# Patient Record
Sex: Female | Born: 1965
Health system: Southern US, Community
[De-identification: ages and names within clinical notes are randomized; demographics above are authoritative.]

## PROBLEM LIST (undated history)

## (undated) DIAGNOSIS — I499 Cardiac arrhythmia, unspecified: Secondary | ICD-10-CM

## (undated) DIAGNOSIS — R0602 Shortness of breath: Secondary | ICD-10-CM

## (undated) DIAGNOSIS — F32A Depression, unspecified: Secondary | ICD-10-CM

## (undated) DIAGNOSIS — T7840XA Allergy, unspecified, initial encounter: Secondary | ICD-10-CM

## (undated) DIAGNOSIS — J45909 Unspecified asthma, uncomplicated: Secondary | ICD-10-CM

## (undated) DIAGNOSIS — M7989 Other specified soft tissue disorders: Secondary | ICD-10-CM

## (undated) DIAGNOSIS — N390 Urinary tract infection, site not specified: Secondary | ICD-10-CM

## (undated) DIAGNOSIS — K219 Gastro-esophageal reflux disease without esophagitis: Secondary | ICD-10-CM

## (undated) DIAGNOSIS — R002 Palpitations: Secondary | ICD-10-CM

## (undated) DIAGNOSIS — Z91018 Allergy to other foods: Secondary | ICD-10-CM

## (undated) DIAGNOSIS — B029 Zoster without complications: Secondary | ICD-10-CM

## (undated) DIAGNOSIS — K2100 Gastro-esophageal reflux disease with esophagitis, without bleeding: Secondary | ICD-10-CM

## (undated) DIAGNOSIS — B019 Varicella without complication: Secondary | ICD-10-CM

## (undated) DIAGNOSIS — F329 Major depressive disorder, single episode, unspecified: Secondary | ICD-10-CM

## (undated) DIAGNOSIS — K589 Irritable bowel syndrome without diarrhea: Secondary | ICD-10-CM

## (undated) DIAGNOSIS — F419 Anxiety disorder, unspecified: Secondary | ICD-10-CM

## (undated) DIAGNOSIS — I10 Essential (primary) hypertension: Secondary | ICD-10-CM

## (undated) HISTORY — DX: Zoster without complications: B02.9

## (undated) HISTORY — DX: Other specified soft tissue disorders: M79.89

## (undated) HISTORY — DX: Gastro-esophageal reflux disease with esophagitis, without bleeding: K21.00

## (undated) HISTORY — DX: Allergy, unspecified, initial encounter: T78.40XA

## (undated) HISTORY — DX: Palpitations: R00.2

## (undated) HISTORY — DX: Anxiety disorder, unspecified: F41.9

## (undated) HISTORY — DX: Depression, unspecified: F32.A

## (undated) HISTORY — DX: Urinary tract infection, site not specified: N39.0

## (undated) HISTORY — DX: Major depressive disorder, single episode, unspecified: F32.9

## (undated) HISTORY — DX: Allergy to other foods: Z91.018

## (undated) HISTORY — DX: Gastro-esophageal reflux disease with esophagitis: K21.0

## (undated) HISTORY — DX: Unspecified asthma, uncomplicated: J45.909

## (undated) HISTORY — DX: Varicella without complication: B01.9

## (undated) HISTORY — DX: Irritable bowel syndrome, unspecified: K58.9

## (undated) HISTORY — DX: Gastro-esophageal reflux disease without esophagitis: K21.9

## (undated) HISTORY — DX: Shortness of breath: R06.02

## (undated) HISTORY — DX: Essential (primary) hypertension: I10

## (undated) HISTORY — DX: Cardiac arrhythmia, unspecified: I49.9

---
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
## 1993-10-17 HISTORY — PX: DILATION AND CURETTAGE OF UTERUS: SHX78

## 2004-09-16 ENCOUNTER — Ambulatory Visit: Payer: Self-pay | Admitting: Obstetrics and Gynecology

## 2004-10-17 HISTORY — PX: NASAL SINUS SURGERY: SHX719

## 2005-09-30 ENCOUNTER — Ambulatory Visit: Payer: Self-pay | Admitting: Obstetrics and Gynecology

## 2006-10-26 ENCOUNTER — Ambulatory Visit: Payer: Self-pay | Admitting: Obstetrics and Gynecology

## 2007-10-30 ENCOUNTER — Ambulatory Visit: Payer: Self-pay | Admitting: Obstetrics and Gynecology

## 2008-11-04 ENCOUNTER — Ambulatory Visit: Payer: Self-pay | Admitting: Obstetrics and Gynecology

## 2009-11-10 ENCOUNTER — Ambulatory Visit: Payer: Self-pay | Admitting: Obstetrics and Gynecology

## 2010-10-15 ENCOUNTER — Emergency Department: Payer: Self-pay | Admitting: Emergency Medicine

## 2010-10-17 HISTORY — PX: CARDIAC ELECTROPHYSIOLOGY STUDY AND ABLATION: SHX1294

## 2010-11-04 ENCOUNTER — Emergency Department: Payer: Self-pay | Admitting: Emergency Medicine

## 2010-11-11 ENCOUNTER — Ambulatory Visit: Payer: Self-pay | Admitting: Obstetrics and Gynecology

## 2010-11-17 ENCOUNTER — Ambulatory Visit: Payer: Self-pay | Admitting: Internal Medicine

## 2011-08-18 LAB — HM PAP SMEAR

## 2011-11-15 ENCOUNTER — Ambulatory Visit: Payer: Self-pay | Admitting: Obstetrics and Gynecology

## 2012-08-02 ENCOUNTER — Encounter: Payer: Self-pay | Admitting: Internal Medicine

## 2012-08-02 ENCOUNTER — Ambulatory Visit (INDEPENDENT_AMBULATORY_CARE_PROVIDER_SITE_OTHER): Payer: Self-pay | Admitting: Internal Medicine

## 2012-08-02 ENCOUNTER — Other Ambulatory Visit: Payer: Self-pay | Admitting: Internal Medicine

## 2012-08-02 VITALS — BP 120/90 | HR 73 | Temp 98.1°F | Ht 66.0 in | Wt 170.0 lb

## 2012-08-02 DIAGNOSIS — Z9889 Other specified postprocedural states: Secondary | ICD-10-CM

## 2012-08-02 DIAGNOSIS — Z23 Encounter for immunization: Secondary | ICD-10-CM

## 2012-08-02 DIAGNOSIS — J45990 Exercise induced bronchospasm: Secondary | ICD-10-CM

## 2012-08-02 DIAGNOSIS — Z0000 Encounter for general adult medical examination without abnormal findings: Secondary | ICD-10-CM

## 2012-08-02 DIAGNOSIS — Z1239 Encounter for other screening for malignant neoplasm of breast: Secondary | ICD-10-CM

## 2012-08-02 MED ORDER — ALBUTEROL SULFATE HFA 108 (90 BASE) MCG/ACT IN AERS: 2.0000 | INHALATION_SPRAY | Freq: Four times a day (QID) | RESPIRATORY_TRACT | Status: DC | PRN

## 2012-08-02 NOTE — Assessment & Plan Note (Signed)
General exam today including breast exam is normal. Pap is deferred because this is up-to-date September 2012. Will schedule mammogram for January 2014. Flu vaccine given today. Will check basic labs including CBC, CMP, lipid profile, TSH, vitamin D. Patient will followup in one year or sooner as needed.

## 2012-08-02 NOTE — Assessment & Plan Note (Signed)
Symptoms of exercise-induced asthma described as minimal. Patient only rarely has used albuterol inhaler. Will give refill on this today.

## 2012-08-02 NOTE — Progress Notes (Signed)
Subjective:    Patient ID: Jill Atkinson, female    DOB: 1965/10/29, 46 y.o.   MRN: [id number]  HPI 46 year old female with history of exercise-induced asthma presents to establish care. She reports she is generally doing well. She follows a relatively healthy diet and exercises by either biking or walking on a regular basis. She occasionally has some shortness of breath with exercise. In the past, she has used albuterol inhaler with some improvement. She denies any chronic cough, shortness of breath at rest. She denies any new concerns today. She reports that she is up-to-date on health maintenance including Pap smear which was performed September 2012 was normal including HPV testing which was negative. Her mammogram was completed in January 2013 was normal.  Outpatient Encounter Prescriptions as of 08/02/2012  Medication Sig Dispense Refill  . albuterol (VENTOLIN HFA) 108 (90 BASE) MCG/ACT inhaler Inhale 2 puffs into the lungs every 6 (six) hours as needed for wheezing.  18 g  3   BP 120/90  Pulse 73  Temp 98.1 F (36.7 C) (Oral)  Ht 5\' 6"  (1.676 m)  Wt 170 lb (77.111 kg)  BMI 27.44 kg/m2  SpO2 98%  LMP 07/24/2012  Review of Systems  Constitutional: Negative for fever, chills, appetite change, fatigue and unexpected weight change.  HENT: Negative for ear pain, congestion, sore throat, trouble swallowing, neck pain, voice change and sinus pressure.   Eyes: Negative for visual disturbance.  Respiratory: Negative for cough, shortness of breath, wheezing and stridor.   Cardiovascular: Negative for chest pain, palpitations and leg swelling.  Gastrointestinal: Negative for nausea, vomiting, abdominal pain, diarrhea, constipation, blood in stool, abdominal distention and anal bleeding.  Genitourinary: Negative for dysuria and flank pain.  Musculoskeletal: Negative for myalgias, arthralgias and gait problem.  Skin: Negative for color change and rash.  Neurological: Negative for dizziness  and headaches.  Hematological: Negative for adenopathy. Does not bruise/bleed easily.  Psychiatric/Behavioral: Negative for suicidal ideas, disturbed wake/sleep cycle and dysphoric mood. The patient is not nervous/anxious.        Objective:   Physical Exam  Constitutional: She is oriented to person, place, and time. She appears well-developed and well-nourished. No distress.  HENT:  Head: Normocephalic and atraumatic.  Right Ear: External ear normal.  Left Ear: External ear normal.  Nose: Nose normal.  Mouth/Throat: Oropharynx is clear and moist. No oropharyngeal exudate.  Eyes: Conjunctivae normal are normal. Pupils are equal, round, and reactive to light. Right eye exhibits no discharge. Left eye exhibits no discharge. No scleral icterus.  Neck: Normal range of motion. Neck supple. No tracheal deviation present. No thyromegaly present.  Cardiovascular: Normal rate, regular rhythm, normal heart sounds and intact distal pulses.  Exam reveals no gallop and no friction rub.   No murmur heard. Pulmonary/Chest: Effort normal and breath sounds normal. No accessory muscle usage. Not tachypneic. No respiratory distress. She has no decreased breath sounds. She has no wheezes. She has no rhonchi. She has no rales. She exhibits no tenderness. Right breast exhibits no inverted nipple, no mass, no nipple discharge, no skin change and no tenderness. Left breast exhibits no inverted nipple, no mass, no nipple discharge, no skin change and no tenderness. Breasts are symmetrical.  Abdominal: Soft. Bowel sounds are normal. She exhibits no distension and no mass. There is no tenderness. There is no rebound and no guarding.  Musculoskeletal: Normal range of motion. She exhibits no edema and no tenderness.  Lymphadenopathy:    She has no cervical adenopathy.  Neurological: She is alert and oriented to person, place, and time. No cranial nerve deficit. She exhibits normal muscle tone. Coordination normal.  Skin:  Skin is warm and dry. No rash noted. She is not diaphoretic. No erythema. No pallor.  Psychiatric: She has a normal mood and affect. Her behavior is normal. Judgment and thought content normal.          Assessment & Plan:

## 2012-08-03 LAB — CBC WITH DIFFERENTIAL
Basos: 0 % (ref 0–3)
Eos: 1 % (ref 0–5)
Hemoglobin: 14.2 g/dL (ref 11.1–15.9)
Immature Grans (Abs): 0 10*3/uL (ref 0.0–0.1)
MCH: 29 pg (ref 26.6–33.0)
MCV: 86 fL (ref 79–97)
Monocytes Absolute: 0.6 10*3/uL (ref 0.1–0.9)
Monocytes: 9 % (ref 4–12)
Neutrophils Relative %: 66 % (ref 40–74)
RBC: 4.89 x10E6/uL (ref 3.77–5.28)

## 2012-08-03 LAB — LIPID PANEL W/O CHOL/HDL RATIO
HDL: 68 mg/dL (ref >39)
LDL Calculated: 119 mg/dL — ABNORMAL HIGH (ref 0–99)
Triglycerides: 79 mg/dL (ref 0–149)
VLDL Cholesterol Cal: 16 mg/dL (ref 5–40)

## 2012-08-03 LAB — VITAMIN D 25 HYDROXY (VIT D DEFICIENCY, FRACTURES): Vit D, 25-Hydroxy: 16.1 ng/mL — ABNORMAL LOW (ref 30.0–100.0)

## 2012-08-03 LAB — COMPREHENSIVE METABOLIC PANEL
Albumin/Globulin Ratio: 1.8 (ref 1.1–2.5)
Albumin: 4.4 g/dL (ref 3.5–5.5)
BUN: 8 mg/dL (ref 6–24)
Calcium: 9.5 mg/dL (ref 8.7–10.2)
Creatinine, Ser: 0.7 mg/dL (ref 0.57–1.00)
GFR calc non Af Amer: 103 mL/min/{1.73_m2} (ref >59)
Globulin, Total: 2.4 g/dL (ref 1.5–4.5)
Sodium: 139 mmol/L (ref 134–144)
Total Protein: 6.8 g/dL (ref 6.0–8.5)

## 2012-08-03 LAB — TSH: TSH: 1.71 u[IU]/mL (ref 0.450–4.500)

## 2012-12-27 ENCOUNTER — Encounter: Payer: Self-pay | Admitting: Internal Medicine

## 2013-05-03 ENCOUNTER — Ambulatory Visit (INDEPENDENT_AMBULATORY_CARE_PROVIDER_SITE_OTHER): Payer: BC Managed Care – PPO | Admitting: Internal Medicine

## 2013-05-03 ENCOUNTER — Encounter: Payer: Self-pay | Admitting: Internal Medicine

## 2013-05-03 VITALS — BP 130/78 | HR 92 | Temp 97.4°F | Resp 14 | Wt 181.2 lb

## 2013-05-03 DIAGNOSIS — J019 Acute sinusitis, unspecified: Secondary | ICD-10-CM

## 2013-05-03 MED ORDER — FLUCONAZOLE 150 MG PO TABS: 150.0000 mg | ORAL_TABLET | Freq: Every day | ORAL | Status: DC

## 2013-05-03 MED ORDER — PREDNISONE (PAK) 10 MG PO TABS: ORAL_TABLET | ORAL | Status: DC

## 2013-05-03 MED ORDER — AMOXICILLIN-POT CLAVULANATE 875-125 MG PO TABS
1.0000 | ORAL_TABLET | Freq: Two times a day (BID) | ORAL | Status: DC
Start: 2013-05-03 — End: 2013-08-06

## 2013-05-03 NOTE — Patient Instructions (Signed)
You have a sinus/ear infection   .  I am prescribing an antibiotic augmentin to take twice daily with food for 7 to 10 days.  You may add the prednisone taper  After 48 hours if you do not feel an improvement in the pressure .   I also advise use of the following OTC meds to help with your other symptoms.   You may use Afrin nasal spray (1 squirt in each nostril)  2 times daily for 3 days, then once daily for 2 days,  Then STOP  flushes your sinuses twice daily with Simply Saline   Fluconazole for two days IF you develop a yeast infection

## 2013-05-03 NOTE — Progress Notes (Signed)
Patient ID: Jill Atkinson, female   DOB: 04-01-1966, 47 y.o.   MRN: [id number]  Patient Active Problem List   Diagnosis Date Noted  . Sinusitis, acute 05/05/2013  . General medical exam 08/02/2012  . Exercise-induced asthma 08/02/2012  . History of prior ablation treatment 08/02/2012    Subjective:  CC:   Chief Complaint  Patient presents with  . Acute Visit    rigt sided sinus pain with noted swelling below right eye.    HPI:   Jill Atkinson a 47 y.o. female who presents with Several weeks of right sided sinus pain and congestion, which has not only failed to resolve but has worsened despite regular use of saline flushes  And zyrtec.  She has avoided decongestants due to history of hypertension  And has not used Afrin due to fear of rebound congestion.  She reports persistent pain in the area of the right maxillary sinus. She has a history of bilateral sinus surgery by Gertie Baron  In 2006 which resovled her symptoms for a few weeks only.   Past Medical History  Diagnosis Date  . Asthma     exercise induced  . Chicken pox   . Shingles   . Allergy     hay fever  . Arrhythmia     AVNRT, s/p ablation Dr. Christin Fudge at [facility]  . Hypertension   . UTI (lower urinary tract infection)   . Depression     post partum    Past Surgical History  Procedure Laterality Date  . Tonsillectomy  1978  . Cardiac electrophysiology study and ablation  2012   . Nasal sinus surgery  2006  . Dilation and curettage of uterus  1995    post delivery       The following portions of the patient's history were reviewed and updated as appropriate: Allergies, current medications, and problem list.    Review of Systems:   12 Pt  review of systems was negative except those addressed in the HPI,     History   Social History  . Marital Status: Married    Spouse Name: N/A    Number of Children: N/A  . Years of Education: N/A   Occupational History  . Not on file.   Social History  Main Topics  . Smoking status: Never Smoker   . Smokeless tobacco: Not on file  . Alcohol Use: Yes  . Drug Use: No  . Sexually Active: Not on file   Other Topics Concern  . Not on file   Social History Narrative   Lives in [place].      Work - Manages website for [employer]   Diet - healthy    Exercise - limited    Objective:  BP 130/78  Pulse 92  Temp(Src) 97.4 F (36.3 C) (Oral)  Resp 14  Wt 181 lb 4 oz (82.214 kg)  BMI 29.27 kg/m2  SpO2 99%  LMP 04/29/2013  General appearance: alert, cooperative and appears stated age Ears: bilateral injected  TM's without effusion or bulging Sinuses: right fontal sinus tenderness. otherwise normal Throat: lips, mucosa, and tongue normal; teeth and gums normal Neck: right sided cervical LAD, no carotid bruit, supple, symmetrical, trachea midline and thyroid not enlarged, symmetric, no tenderness/mass/nodules Back: symmetric, no curvature. ROM normal. No CVA tenderness. Lungs: clear to auscultation bilaterally Heart: regular rate and rhythm, S1, S2 normal, no murmur, click, rub or gallop Abdomen: soft, non-tender; bowel sounds normal; no masses,  no organomegaly Pulses: 2+ and  symmetric Skin: Skin color, texture, turgor normal. No rashes or lesions Lymph nodes: Cervical, supraclavicular, and axillary nodes normal.  Assessment and Plan:  Sinusitis, acute With underlying sinus pathology ,  Levaquin, afrin and saline flushes. add prednisone in 48 hours if no improvement.   Updated Medication List Outpatient Encounter Prescriptions as of 05/03/2013  Medication Sig Dispense Refill  . albuterol (VENTOLIN HFA) 108 (90 BASE) MCG/ACT inhaler Inhale 2 puffs into the lungs every 6 (six) hours as needed for wheezing.  18 g  3  . cetirizine (ZYRTEC) 10 MG tablet Take 10 mg by mouth daily.      . Cholecalciferol (VITAMIN D) 2000 UNITS CAPS Take by mouth.      Marland Kitchen amoxicillin-clavulanate (AUGMENTIN) 875-125 MG per tablet Take 1 tablet by mouth 2  (two) times daily.  20 tablet  0  . fluconazole (DIFLUCAN) 150 MG tablet Take 1 tablet (150 mg total) by mouth daily.  2 tablet  0  . predniSONE (STERAPRED UNI-PAK) 10 MG tablet 6 tablets on day 1, decrease by tablet daily until gone  21 tablet  0   No facility-administered encounter medications on file as of 05/03/2013.     No orders of the defined types were placed in this encounter.    No Follow-up on file.

## 2013-05-05 ENCOUNTER — Encounter: Payer: Self-pay | Admitting: Internal Medicine

## 2013-05-05 DIAGNOSIS — J019 Acute sinusitis, unspecified: Secondary | ICD-10-CM | POA: Insufficient documentation

## 2013-05-05 NOTE — Assessment & Plan Note (Signed)
With underlying sinus pathology ,  Levaquin, afrin and saline flushes. add prednisone in 48 hours if no improvement.

## 2013-08-06 ENCOUNTER — Ambulatory Visit (INDEPENDENT_AMBULATORY_CARE_PROVIDER_SITE_OTHER): Payer: BC Managed Care – PPO | Admitting: Internal Medicine

## 2013-08-06 ENCOUNTER — Encounter: Payer: Self-pay | Admitting: Internal Medicine

## 2013-08-06 VITALS — BP 140/96 | HR 80 | Temp 98.3°F | Ht 66.25 in | Wt 179.0 lb

## 2013-08-06 DIAGNOSIS — J3489 Other specified disorders of nose and nasal sinuses: Secondary | ICD-10-CM

## 2013-08-06 DIAGNOSIS — Z23 Encounter for immunization: Secondary | ICD-10-CM | POA: Insufficient documentation

## 2013-08-06 DIAGNOSIS — R0981 Nasal congestion: Secondary | ICD-10-CM

## 2013-08-06 DIAGNOSIS — N951 Menopausal and female climacteric states: Secondary | ICD-10-CM

## 2013-08-06 DIAGNOSIS — Z0000 Encounter for general adult medical examination without abnormal findings: Secondary | ICD-10-CM

## 2013-08-06 LAB — CBC WITH DIFFERENTIAL/PLATELET
Basophils Absolute: 0 10*3/uL (ref 0.0–0.1)
Basophils Relative: 0.3 % (ref 0.0–3.0)
Eosinophils Absolute: 0.1 10*3/uL (ref 0.0–0.7)
Eosinophils Relative: 1.3 % (ref 0.0–5.0)
HCT: 38.4 % (ref 36.0–46.0)
Lymphs Abs: 1.2 10*3/uL (ref 0.7–4.0)
MCHC: 33.2 g/dL (ref 30.0–36.0)
MCV: 86.6 fl (ref 78.0–100.0)
Monocytes Absolute: 0.5 10*3/uL (ref 0.1–1.0)
Neutro Abs: 4.1 10*3/uL (ref 1.4–7.7)
Platelets: 369 10*3/uL (ref 150.0–400.0)
RBC: 4.43 Mil/uL (ref 3.87–5.11)
RDW: 15.5 % — ABNORMAL HIGH (ref 11.5–14.6)
WBC: 5.9 10*3/uL (ref 4.5–10.5)

## 2013-08-06 LAB — LIPID PANEL
Total CHOL/HDL Ratio: 3
Triglycerides: 71 mg/dL (ref 0.0–149.0)
VLDL: 14.2 mg/dL (ref 0.0–40.0)

## 2013-08-06 LAB — COMPREHENSIVE METABOLIC PANEL
Alkaline Phosphatase: 69 U/L (ref 39–117)
BUN: 11 mg/dL (ref 6–23)
CO2: 26 mEq/L (ref 19–32)
Creatinine, Ser: 0.6 mg/dL (ref 0.4–1.2)
GFR: 115.08 mL/min (ref >60.00)
Glucose, Bld: 89 mg/dL (ref 70–99)
Total Bilirubin: 0.7 mg/dL (ref 0.3–1.2)
Total Protein: 7.2 g/dL (ref 6.0–8.3)

## 2013-08-06 LAB — LDL CHOLESTEROL, DIRECT: Direct LDL: 142.3 mg/dL

## 2013-08-06 MED ORDER — MOMETASONE FUROATE 50 MCG/ACT NA SUSP: 2.0000 | Freq: Every day | NASAL | Status: DC

## 2013-08-06 NOTE — Progress Notes (Signed)
Subjective:    Patient ID: Jill Atkinson, female    DOB: 03/29/1966, 47 y.o.   MRN: [id number]  HPI 47 year old female with history of exercise-induced asthma presents for annual exam. She reports she is generally feeling well. Over the last several months, she has had increased nasal congestion and difficulty breathing through her nose, particularly at night. She attributes this to allergies. She has not had any fever or chills. She denies cough or shortness of breath. In the past, she underwent nasal sinus surgery and reports she was doing well until this summer. She has tried taking over-the-counter Zyrtec with no improvement.  She also notes increased irritability prior to her menstrual cycles. Her menses continue to be regular for the most part. In the past, she used fluoxetine after the birth of her child for increased anxiety and depressive symptoms. She is not interested in restarting medication at this time. She has tried to increase her physical activity in an effort to control symptoms.  Outpatient Encounter Prescriptions as of 08/06/2013  Medication Sig Dispense Refill  . albuterol (VENTOLIN HFA) 108 (90 BASE) MCG/ACT inhaler Inhale 2 puffs into the lungs every 6 (six) hours as needed for wheezing.  18 g  3  . cetirizine (ZYRTEC) 10 MG tablet Take 10 mg by mouth daily.      . Cholecalciferol (VITAMIN D) 2000 UNITS CAPS Take by mouth.      . fish oil-omega-3 fatty acids 1000 MG capsule Take 2 g by mouth daily.       No facility-administered encounter medications on file as of 08/06/2013.   BP 140/96  Pulse 80  Temp(Src) 98.3 F (36.8 C) (Oral)  Ht 5' 6.25" (1.683 m)  Wt 179 lb (81.194 kg)  BMI 28.67 kg/m2  SpO2 98%  LMP 07/23/2013  Review of Systems  Constitutional: Negative for fever, chills, appetite change, fatigue and unexpected weight change.  HENT: Positive for congestion, rhinorrhea and sinus pressure. Negative for ear pain, postnasal drip, sore throat, trouble  swallowing and voice change.   Eyes: Negative for visual disturbance.  Respiratory: Negative for cough, shortness of breath, wheezing and stridor.   Cardiovascular: Negative for chest pain, palpitations and leg swelling.  Gastrointestinal: Negative for nausea, vomiting, abdominal pain, diarrhea, constipation, blood in stool, abdominal distention and anal bleeding.  Genitourinary: Negative for dysuria and flank pain.  Musculoskeletal: Negative for arthralgias, gait problem, myalgias and neck pain.  Skin: Negative for color change and rash.  Neurological: Negative for dizziness and headaches.  Hematological: Negative for adenopathy. Does not bruise/bleed easily.  Psychiatric/Behavioral: Negative for suicidal ideas, sleep disturbance and dysphoric mood. The patient is not nervous/anxious.        Objective:   Physical Exam  Constitutional: She is oriented to person, place, and time. She appears well-developed and well-nourished. No distress.  HENT:  Head: Normocephalic and atraumatic.  Right Ear: External ear normal.  Left Ear: External ear normal.  Nose: Nose normal.  Mouth/Throat: Oropharynx is clear and moist. No oropharyngeal exudate.  Eyes: Conjunctivae are normal. Pupils are equal, round, and reactive to light. Right eye exhibits no discharge. Left eye exhibits no discharge. No scleral icterus.  Neck: Normal range of motion. Neck supple. No tracheal deviation present. No thyromegaly present.  Cardiovascular: Normal rate, regular rhythm, normal heart sounds and intact distal pulses.  Exam reveals no gallop and no friction rub.   No murmur heard. Pulmonary/Chest: Effort normal and breath sounds normal. No accessory muscle usage. Not tachypneic. No respiratory distress.  She has no decreased breath sounds. She has no wheezes. She has no rhonchi. She has no rales. She exhibits no tenderness. Right breast exhibits no inverted nipple, no mass, no nipple discharge, no skin change and no  tenderness. Left breast exhibits no inverted nipple, no mass, no nipple discharge, no skin change and no tenderness. Breasts are symmetrical.  Abdominal: Soft. Bowel sounds are normal. She exhibits no distension and no mass. There is no tenderness. There is no rebound and no guarding.  Musculoskeletal: Normal range of motion. She exhibits no edema and no tenderness.  Lymphadenopathy:    She has no cervical adenopathy.  Neurological: She is alert and oriented to person, place, and time. No cranial nerve deficit. She exhibits normal muscle tone. Coordination normal.  Skin: Skin is warm and dry. No rash noted. She is not diaphoretic. No erythema. No pallor.  Psychiatric: She has a normal mood and affect. Her behavior is normal. Judgment and thought content normal.          Assessment & Plan:

## 2013-08-06 NOTE — Assessment & Plan Note (Signed)
Recent symptoms of nasal congestion likely exacerbation of allergic rhinitis. Will add nasal steroid. Samples of Dymista given today. Pt will call if no improvement. If no improvement, would favor referral back to ENT, given pt h/o nasal sinus surgery.

## 2013-08-06 NOTE — Assessment & Plan Note (Signed)
Symptoms suggestive of premenstrual dysphoria, likely exacerbated by hormonal changes related to menopause. We discussed potentially adding fluoxetine or sertraline to help better control symptoms. She would like to hold off for now. She will call if symptoms are worsening.

## 2013-08-06 NOTE — Assessment & Plan Note (Signed)
General medical exam including breast exam normal today. Pap and pelvic deferred as normal 2012, patient is followed by OB/GYN. Vaccinations including influenza, Pneumovax, and Tdap given today. Will check basic labs including CBC, CMP, TSH, lipid profile. Encouraged healthy diet and regular physical activity. Follow up 1 year and prn.

## 2013-08-07 LAB — VITAMIN D 25 HYDROXY (VIT D DEFICIENCY, FRACTURES): Vit D, 25-Hydroxy: 59 ng/mL (ref 30–89)

## 2013-09-18 ENCOUNTER — Ambulatory Visit: Payer: Self-pay | Admitting: Internal Medicine

## 2013-09-27 ENCOUNTER — Ambulatory Visit (INDEPENDENT_AMBULATORY_CARE_PROVIDER_SITE_OTHER): Payer: BC Managed Care – PPO | Admitting: Internal Medicine

## 2013-09-27 ENCOUNTER — Encounter: Payer: Self-pay | Admitting: Internal Medicine

## 2013-09-27 VITALS — BP 126/82 | HR 90 | Temp 97.8°F | Wt 180.0 lb

## 2013-09-27 DIAGNOSIS — N959 Unspecified menopausal and perimenopausal disorder: Secondary | ICD-10-CM

## 2013-09-27 DIAGNOSIS — J3489 Other specified disorders of nose and nasal sinuses: Secondary | ICD-10-CM

## 2013-09-27 DIAGNOSIS — R0981 Nasal congestion: Secondary | ICD-10-CM

## 2013-09-27 MED ORDER — BUPROPION HCL ER (XL) 150 MG PO TB24: 150.0000 mg | ORAL_TABLET | Freq: Every day | ORAL | Status: DC

## 2013-09-27 MED ORDER — MOMETASONE FUROATE 50 MCG/ACT NA SUSP: 2.0000 | Freq: Every day | NASAL | Status: DC

## 2013-09-27 NOTE — Progress Notes (Signed)
Pre visit review using our clinic review tool, if applicable. No additional management support is needed unless otherwise documented below in the visit note. 

## 2013-09-27 NOTE — Progress Notes (Signed)
Subjective:    Patient ID: Jill Atkinson, female    DOB: Feb 23, 1966, 47 y.o.   MRN: [id number]  HPI 47YO female presents for acute visit complaining of increased symptoms of depressed mood, tearfulness, unexpected weight gain, and difficulty sleeping over the last few months. She reports that her periods have become much lighter. This month, she only had some spotting. As her periods have lessened, she has developed symptoms of increased tearfulness and depressed mood. She denies any recent stressors either at home or work. In the past, she took fluoxetine for postpartum depression. However, she has not been on any long-term antidepressant medications. She also notes occasional difficulty sleeping. She sometimes feels that she is harder than others. She denies hot flashes.  Outpatient Encounter Prescriptions as of 09/27/2013  Medication Sig  . fexofenadine (ALLEGRA) 180 MG tablet Take 180 mg by mouth daily.  Marland Kitchen albuterol (VENTOLIN HFA) 108 (90 BASE) MCG/ACT inhaler Inhale 2 puffs into the lungs every 6 (six) hours as needed for wheezing.  . cetirizine (ZYRTEC) 10 MG tablet Take 10 mg by mouth daily.  . Cholecalciferol (VITAMIN D) 2000 UNITS CAPS Take by mouth.  . fish oil-omega-3 fatty acids 1000 MG capsule Take 2 g by mouth daily.  . mometasone (NASONEX) 50 MCG/ACT nasal spray Place 2 sprays into the nose daily.    Review of Systems  Constitutional: Positive for unexpected weight change. Negative for fever, chills, appetite change and fatigue.  HENT: Negative for congestion.   Eyes: Negative for visual disturbance.  Respiratory: Negative for shortness of breath.   Cardiovascular: Negative for chest pain, palpitations and leg swelling.  Gastrointestinal: Negative for abdominal pain.  Genitourinary: Negative for dysuria and flank pain.  Skin: Negative for color change and rash.  Neurological: Negative for dizziness and headaches.  Hematological: Negative for adenopathy. Does not  bruise/bleed easily.  Psychiatric/Behavioral: Positive for sleep disturbance and dysphoric mood. Negative for suicidal ideas. The patient is nervous/anxious.        Objective:   Physical Exam  Constitutional: She is oriented to person, place, and time. She appears well-developed and well-nourished. No distress.  HENT:  Head: Normocephalic and atraumatic.  Right Ear: External ear normal.  Left Ear: External ear normal.  Nose: Nose normal.  Mouth/Throat: Oropharynx is clear and moist. No oropharyngeal exudate.  Eyes: Conjunctivae are normal. Pupils are equal, round, and reactive to light. Right eye exhibits no discharge. Left eye exhibits no discharge. No scleral icterus.  Neck: Normal range of motion. Neck supple. No tracheal deviation present. No thyromegaly present.  Cardiovascular: Normal rate, regular rhythm, normal heart sounds and intact distal pulses.  Exam reveals no gallop and no friction rub.   No murmur heard. Pulmonary/Chest: Effort normal and breath sounds normal. No accessory muscle usage. Not tachypneic. No respiratory distress. She has no decreased breath sounds. She has no wheezes. She has no rhonchi. She has no rales. She exhibits no tenderness.  Musculoskeletal: Normal range of motion. She exhibits no edema and no tenderness.  Lymphadenopathy:    She has no cervical adenopathy.  Neurological: She is alert and oriented to person, place, and time. No cranial nerve deficit. She exhibits normal muscle tone. Coordination normal.  Skin: Skin is warm and dry. No rash noted. She is not diaphoretic. No erythema. No pallor.  Psychiatric: Her speech is normal and behavior is normal. Judgment and thought content normal. Her mood appears anxious. She exhibits a depressed mood.  Assessment & Plan:

## 2013-09-27 NOTE — Assessment & Plan Note (Addendum)
Symptoms consistent with adjustment disorder with mixed anxiety and depressed mood related to menopause. Offered support today. Encouraged her to take time for herself for exercise and rest. Will start Wellbutrin 150mg  po daily to help control symptoms. Follow up 4 weeks and prn.  Over of which >50% spent in face-to-face contact with patient discussing plan of care

## 2013-10-14 ENCOUNTER — Encounter: Payer: Self-pay | Admitting: Internal Medicine

## 2014-01-17 ENCOUNTER — Other Ambulatory Visit: Payer: Self-pay | Admitting: Internal Medicine

## 2014-03-04 ENCOUNTER — Encounter: Payer: Self-pay | Admitting: Internal Medicine

## 2014-03-04 MED ORDER — BUPROPION HCL ER (XL) 150 MG PO TB24: ORAL_TABLET | ORAL | Status: DC

## 2014-03-04 NOTE — Telephone Encounter (Signed)
Please advise 

## 2014-03-23 ENCOUNTER — Other Ambulatory Visit: Payer: Self-pay | Admitting: Internal Medicine

## 2014-08-05 ENCOUNTER — Encounter: Payer: Self-pay | Admitting: Internal Medicine

## 2014-08-05 ENCOUNTER — Ambulatory Visit (INDEPENDENT_AMBULATORY_CARE_PROVIDER_SITE_OTHER): Payer: BC Managed Care – PPO | Admitting: Internal Medicine

## 2014-08-05 VITALS — BP 140/80 | HR 95 | Temp 97.8°F | Ht 66.5 in | Wt 172.8 lb

## 2014-08-05 DIAGNOSIS — Z0000 Encounter for general adult medical examination without abnormal findings: Secondary | ICD-10-CM

## 2014-08-05 DIAGNOSIS — Z1211 Encounter for screening for malignant neoplasm of colon: Secondary | ICD-10-CM

## 2014-08-05 DIAGNOSIS — J45990 Exercise induced bronchospasm: Secondary | ICD-10-CM

## 2014-08-05 MED ORDER — ALBUTEROL SULFATE HFA 108 (90 BASE) MCG/ACT IN AERS: 2.0000 | INHALATION_SPRAY | Freq: Four times a day (QID) | RESPIRATORY_TRACT | Status: DC | PRN

## 2014-08-05 MED ORDER — BUPROPION HCL ER (XL) 150 MG PO TB24: ORAL_TABLET | ORAL | Status: DC

## 2014-08-05 NOTE — Assessment & Plan Note (Signed)
General medical exam normal today including breast exam. Mammogram ordered, due 09/2014. PAP and pelvic deferred per pt preference. Last PAP 2012 normal, HPV neg, plan repeat 2017. Flu vaccine will be obtained through her work. Labs today including CBC, CMP, lipids, TSH. Encouraged healthy diet and exercise.

## 2014-08-05 NOTE — Progress Notes (Signed)
Subjective:    Patient ID: Jill Atkinson, female    DOB: 1966-08-08, 48 y.o.   MRN: [id number]  HPI 48YO female presents for annual exam. Feeling well. Travelling to [employer] for work so exercise has been more limited. No concerns today. Wellbutrin helping with pre-menstrual symptoms.  Review of Systems  Constitutional: Negative for fever, chills, appetite change, fatigue and unexpected weight change.  Eyes: Negative for visual disturbance.  Respiratory: Negative for shortness of breath.   Cardiovascular: Negative for chest pain and leg swelling.  Gastrointestinal: Negative for nausea, vomiting, abdominal pain, diarrhea, constipation and abdominal distention.  Musculoskeletal: Negative for arthralgias and myalgias.  Skin: Negative for color change and rash.  Hematological: Negative for adenopathy. Does not bruise/bleed easily.  Psychiatric/Behavioral: Negative for dysphoric mood. The patient is not nervous/anxious.        Objective:    BP 140/80  Pulse 95  Temp(Src) 97.8 F (36.6 C) (Oral)  Ht 5' 6.5" (1.689 m)  Wt 172 lb 12 oz (78.359 kg)  BMI 27.47 kg/m2  SpO2 97%  LMP 06/26/2014 Physical Exam  Constitutional: She is oriented to person, place, and time. She appears well-developed and well-nourished. No distress.  HENT:  Head: Normocephalic and atraumatic.  Right Ear: External ear normal.  Left Ear: External ear normal.  Nose: Nose normal.  Mouth/Throat: Oropharynx is clear and moist. No oropharyngeal exudate.  Eyes: Conjunctivae are normal. Pupils are equal, round, and reactive to light. Right eye exhibits no discharge. Left eye exhibits no discharge. No scleral icterus.  Neck: Normal range of motion. Neck supple. No tracheal deviation present. No thyromegaly present.  Cardiovascular: Normal rate, regular rhythm, normal heart sounds and intact distal pulses.  Exam reveals no gallop and no friction rub.   No murmur heard. Pulmonary/Chest: Effort normal and breath  sounds normal. No accessory muscle usage. Not tachypneic. No respiratory distress. She has no decreased breath sounds. She has no wheezes. She has no rhonchi. She has no rales. She exhibits no tenderness. Right breast exhibits no inverted nipple, no mass, no nipple discharge, no skin change and no tenderness. Left breast exhibits no inverted nipple, no mass, no nipple discharge, no skin change and no tenderness. Breasts are symmetrical.  Abdominal: Soft. Bowel sounds are normal. She exhibits no distension and no mass. There is no tenderness. There is no rebound and no guarding.  Musculoskeletal: Normal range of motion. She exhibits no edema and no tenderness.  Lymphadenopathy:    She has no cervical adenopathy.  Neurological: She is alert and oriented to person, place, and time. No cranial nerve deficit. She exhibits normal muscle tone. Coordination normal.  Skin: Skin is warm and dry. No rash noted. She is not diaphoretic. No erythema. No pallor.  Psychiatric: She has a normal mood and affect. Her behavior is normal. Judgment and thought content normal.          Assessment & Plan:   Problem List Items Addressed This Visit     Unprioritized   Exercise-induced asthma   Relevant Medications      albuterol (VENTOLIN HFA) 108 (90 BASE) MCG/ACT inhaler   Routine general medical examination at a health care facility - Primary     General medical exam normal today including breast exam. Mammogram ordered, due 09/2014. PAP and pelvic deferred per pt preference. Last PAP 2012 normal, HPV neg, plan repeat 2017. Flu vaccine will be obtained through her work. Labs today including CBC, CMP, lipids, TSH. Encouraged healthy diet and exercise.  Relevant Orders      MM Digital Screening      CBC with Differential      Comprehensive metabolic panel      Lipid panel      Microalbumin / creatinine urine ratio      Vit D  25 hydroxy (rtn osteoporosis monitoring)      TSH    Other Visit Diagnoses    Screening for colon cancer        Relevant Orders       Ambulatory referral to Gastroenterology        Return in about 1 year (around 08/06/2015) for Physical.

## 2014-08-05 NOTE — Patient Instructions (Signed)

## 2014-08-05 NOTE — Progress Notes (Signed)
Pre visit review using our clinic review tool, if applicable. No additional management support is needed unless otherwise documented below in the visit note. 

## 2014-08-06 LAB — CBC WITH DIFFERENTIAL/PLATELET
Basophils Absolute: 0.1 K/uL (ref 0.0–0.1)
Basophils Relative: 1 % (ref 0.0–3.0)
Eosinophils Absolute: 0.1 K/uL (ref 0.0–0.7)
Eosinophils Relative: 0.7 % (ref 0.0–5.0)
HCT: 42.4 % (ref 36.0–46.0)
Hemoglobin: 13.8 g/dL (ref 12.0–15.0)
Lymphocytes Relative: 20.4 % (ref 12.0–46.0)
Lymphs Abs: 1.8 K/uL (ref 0.7–4.0)
MCHC: 32.4 g/dL (ref 30.0–36.0)
MCV: 85.8 fl (ref 78.0–100.0)
Monocytes Absolute: 0.8 K/uL (ref 0.1–1.0)
Monocytes Relative: 9.2 % (ref 3.0–12.0)
Neutro Abs: 6.1 K/uL (ref 1.4–7.7)
Neutrophils Relative %: 68.7 % (ref 43.0–77.0)
Platelets: 338 K/uL (ref 150.0–400.0)
RBC: 4.95 Mil/uL (ref 3.87–5.11)
RDW: 17.1 % — ABNORMAL HIGH (ref 11.5–15.5)
WBC: 8.9 K/uL (ref 4.0–10.5)

## 2014-08-06 LAB — MICROALBUMIN / CREATININE URINE RATIO
Creatinine,U: 74.6 mg/dL
Microalb Creat Ratio: 0.4 mg/g (ref 0.0–30.0)
Microalb, Ur: 0.3 mg/dL (ref 0.0–1.9)

## 2014-08-06 LAB — VITAMIN D 25 HYDROXY (VIT D DEFICIENCY, FRACTURES): VITD: 50.63 ng/mL (ref 30.00–100.00)

## 2014-08-07 LAB — LIPID PANEL
Cholesterol: 196 mg/dL (ref 0–200)
HDL: 73.9 mg/dL (ref >39.00)
LDL Cholesterol: 87 mg/dL (ref 0–99)
NONHDL: 122.1
TRIGLYCERIDES: 175 mg/dL — AB (ref 0.0–149.0)
Total CHOL/HDL Ratio: 3
VLDL: 35 mg/dL (ref 0.0–40.0)

## 2014-08-07 LAB — COMPREHENSIVE METABOLIC PANEL
ALT: 17 U/L (ref 0–35)
AST: 16 U/L (ref 0–37)
Albumin: 3.8 g/dL (ref 3.5–5.2)
Alkaline Phosphatase: 82 U/L (ref 39–117)
BUN: 10 mg/dL (ref 6–23)
CHLORIDE: 104 meq/L (ref 96–112)
CO2: 24 mEq/L (ref 19–32)
Calcium: 9.7 mg/dL (ref 8.4–10.5)
Creatinine, Ser: 0.9 mg/dL (ref 0.4–1.2)
GFR: 71.32 mL/min (ref >60.00)
Glucose, Bld: 79 mg/dL (ref 70–99)
Potassium: 4.3 mEq/L (ref 3.5–5.1)
Sodium: 138 mEq/L (ref 135–145)
Total Bilirubin: 0.4 mg/dL (ref 0.2–1.2)
Total Protein: 7.4 g/dL (ref 6.0–8.3)

## 2014-08-07 LAB — TSH: TSH: 1.72 u[IU]/mL (ref 0.35–4.50)

## 2014-10-08 ENCOUNTER — Encounter: Payer: Self-pay | Admitting: *Deleted

## 2014-10-08 ENCOUNTER — Telehealth: Payer: Self-pay | Admitting: *Deleted

## 2014-10-08 NOTE — Telephone Encounter (Signed)
A user error has taken place.

## 2015-08-11 ENCOUNTER — Other Ambulatory Visit (HOSPITAL_COMMUNITY)
Admission: RE | Admit: 2015-08-11 | Discharge: 2015-08-11 | Disposition: A | Discharge status: Home / Self Care | Payer: BLUE CROSS/BLUE SHIELD | Source: Ambulatory Visit | Attending: Internal Medicine | Admitting: Internal Medicine

## 2015-08-11 ENCOUNTER — Encounter: Payer: Self-pay | Admitting: Internal Medicine

## 2015-08-11 ENCOUNTER — Ambulatory Visit (INDEPENDENT_AMBULATORY_CARE_PROVIDER_SITE_OTHER): Payer: BLUE CROSS/BLUE SHIELD | Admitting: Internal Medicine

## 2015-08-11 VITALS — BP 163/93 | HR 89 | Temp 98.1°F | Ht 65.75 in | Wt 182.1 lb

## 2015-08-11 DIAGNOSIS — R030 Elevated blood-pressure reading, without diagnosis of hypertension: Secondary | ICD-10-CM | POA: Diagnosis not present

## 2015-08-11 DIAGNOSIS — Z1211 Encounter for screening for malignant neoplasm of colon: Secondary | ICD-10-CM | POA: Diagnosis not present

## 2015-08-11 DIAGNOSIS — Z01419 Encounter for gynecological examination (general) (routine) without abnormal findings: Secondary | ICD-10-CM | POA: Diagnosis present

## 2015-08-11 DIAGNOSIS — Z23 Encounter for immunization: Secondary | ICD-10-CM | POA: Diagnosis not present

## 2015-08-11 DIAGNOSIS — IMO0001 Reserved for inherently not codable concepts without codable children: Secondary | ICD-10-CM

## 2015-08-11 DIAGNOSIS — Z1151 Encounter for screening for human papillomavirus (HPV): Secondary | ICD-10-CM | POA: Insufficient documentation

## 2015-08-11 DIAGNOSIS — I10 Essential (primary) hypertension: Secondary | ICD-10-CM | POA: Insufficient documentation

## 2015-08-11 DIAGNOSIS — Z1239 Encounter for other screening for malignant neoplasm of breast: Secondary | ICD-10-CM | POA: Diagnosis not present

## 2015-08-11 DIAGNOSIS — Z0000 Encounter for general adult medical examination without abnormal findings: Secondary | ICD-10-CM | POA: Diagnosis not present

## 2015-08-11 LAB — CBC WITH DIFFERENTIAL/PLATELET
Basophils Absolute: 0 10*3/uL (ref 0.0–0.1)
Basophils Relative: 0.4 % (ref 0.0–3.0)
Eosinophils Absolute: 0.1 10*3/uL (ref 0.0–0.7)
Eosinophils Relative: 1.4 % (ref 0.0–5.0)
HCT: 44.8 % (ref 36.0–46.0)
Hemoglobin: 14.7 g/dL (ref 12.0–15.0)
Lymphocytes Relative: 17.1 % (ref 12.0–46.0)
Lymphs Abs: 1.2 10*3/uL (ref 0.7–4.0)
MCHC: 32.7 g/dL (ref 30.0–36.0)
MCV: 89.3 fl (ref 78.0–100.0)
MONO ABS: 0.7 10*3/uL (ref 0.1–1.0)
Monocytes Relative: 9.3 % (ref 3.0–12.0)
NEUTROS ABS: 5.1 10*3/uL (ref 1.4–7.7)
NEUTROS PCT: 71.8 % (ref 43.0–77.0)
Platelets: 276 10*3/uL (ref 150.0–400.0)
RBC: 5.02 Mil/uL (ref 3.87–5.11)
RDW: 15 % (ref 11.5–15.5)
WBC: 7.1 10*3/uL (ref 4.0–10.5)

## 2015-08-11 LAB — MICROALBUMIN / CREATININE URINE RATIO
CREATININE, U: 148.1 mg/dL
MICROALB UR: 1.8 mg/dL (ref 0.0–1.9)
MICROALB/CREAT RATIO: 1.2 mg/g (ref 0.0–30.0)

## 2015-08-11 LAB — COMPREHENSIVE METABOLIC PANEL
ALT: 19 U/L (ref 0–35)
AST: 19 U/L (ref 0–37)
Albumin: 4.1 g/dL (ref 3.5–5.2)
Alkaline Phosphatase: 101 U/L (ref 39–117)
BILIRUBIN TOTAL: 0.5 mg/dL (ref 0.2–1.2)
BUN: 9 mg/dL (ref 6–23)
CALCIUM: 9.9 mg/dL (ref 8.4–10.5)
CHLORIDE: 104 meq/L (ref 96–112)
CO2: 28 meq/L (ref 19–32)
CREATININE: 0.83 mg/dL (ref 0.40–1.20)
GFR: 76.99 mL/min (ref >60.00)
GLUCOSE: 95 mg/dL (ref 70–99)
Potassium: 4 mEq/L (ref 3.5–5.1)
SODIUM: 139 meq/L (ref 135–145)
Total Protein: 7 g/dL (ref 6.0–8.3)

## 2015-08-11 LAB — LIPID PANEL
CHOL/HDL RATIO: 3
Cholesterol: 195 mg/dL (ref 0–200)
HDL: 64 mg/dL (ref >39.00)
LDL CALC: 108 mg/dL — AB (ref 0–99)
NONHDL: 131.45
TRIGLYCERIDES: 115 mg/dL (ref 0.0–149.0)
VLDL: 23 mg/dL (ref 0.0–40.0)

## 2015-08-11 LAB — TSH: TSH: 1.85 u[IU]/mL (ref 0.35–4.50)

## 2015-08-11 LAB — HEMOGLOBIN A1C: Hgb A1c MFr Bld: 5.5 % (ref 4.6–6.5)

## 2015-08-11 LAB — VITAMIN D 25 HYDROXY (VIT D DEFICIENCY, FRACTURES): VITD: 40.09 ng/mL (ref 30.00–100.00)

## 2015-08-11 MED ORDER — BUPROPION HCL ER (XL) 150 MG PO TB24: ORAL_TABLET | ORAL | Status: DC

## 2015-08-11 NOTE — Assessment & Plan Note (Signed)
General medical exam normal today including breast and pelvic exam. PAP pending. Mammogram ordered. Colonoscopy ordered. Labs today. Flu vaccine today. Encouraged healthy diet and exercise.

## 2015-08-11 NOTE — Addendum Note (Signed)
Addended by: Karlene Einstein D on: 08/11/2015 02:47 PM   Modules accepted: Orders

## 2015-08-11 NOTE — Progress Notes (Signed)
Pre visit review using our clinic review tool, if applicable. No additional management support is needed unless otherwise documented below in the visit note. 

## 2015-08-11 NOTE — Patient Instructions (Signed)
Health Maintenance, Female Adopting a healthy lifestyle and getting preventive care can go a long way to promote health and wellness. Talk with your health care provider about what schedule of regular examinations is right for you. This is a good chance for you to check in with your provider about disease prevention and staying healthy. In between checkups, there are plenty of things you can do on your own. Experts have done a lot of research about which lifestyle changes and preventive measures are most likely to keep you healthy. Ask your health care provider for more information. WEIGHT AND DIET  Eat a healthy diet  Be sure to include plenty of vegetables, fruits, low-fat dairy products, and lean protein.  Do not eat a lot of foods high in solid fats, added sugars, or salt.  Get regular exercise. This is one of the most important things you can do for your health.  Most adults should exercise for at least 150 minutes each week. The exercise should increase your heart rate and make you sweat (moderate-intensity exercise).  Most adults should also do strengthening exercises at least twice a week. This is in addition to the moderate-intensity exercise.  Maintain a healthy weight  Body mass index (BMI) is a measurement that can be used to identify possible weight problems. It estimates body fat based on height and weight. Your health care provider can help determine your BMI and help you achieve or maintain a healthy weight.  For females 20 years of age and older:   A BMI below 18.5 is considered underweight.  A BMI of 18.5 to 24.9 is normal.  A BMI of 25 to 29.9 is considered overweight.  A BMI of 30 and above is considered obese.  Watch levels of cholesterol and blood lipids  You should start having your blood tested for lipids and cholesterol at 51 years of age, then have this test every 5 years.  You may need to have your cholesterol levels checked more often if:  Your lipid  or cholesterol levels are high.  You are older than 50 years of age.  You are at high risk for heart disease.  CANCER SCREENING   Lung Cancer  Lung cancer screening is recommended for adults 55-80 years old who are at high risk for lung cancer because of a history of smoking.  A yearly low-dose CT scan of the lungs is recommended for people who:  Currently smoke.  Have quit within the past 15 years.  Have at least a 30-pack-year history of smoking. A pack year is smoking an average of one pack of cigarettes a day for 1 year.  Yearly screening should continue until it has been 15 years since you quit.  Yearly screening should stop if you develop a health problem that would prevent you from having lung cancer treatment.  Breast Cancer  Practice breast self-awareness. This means understanding how your breasts normally appear and feel.  It also means doing regular breast self-exams. Let your health care provider know about any changes, no matter how small.  If you are in your 20s or 30s, you should have a clinical breast exam (CBE) by a health care provider every 1-3 years as part of a regular health exam.  If you are 40 or older, have a CBE every year. Also consider having a breast X-ray (mammogram) every year.  If you have a family history of breast cancer, talk to your health care provider about genetic screening.  If you   are at high risk for breast cancer, talk to your health care provider about having an MRI and a mammogram every year.  Breast cancer gene (BRCA) assessment is recommended for women who have family members with BRCA-related cancers. BRCA-related cancers include:  Breast.  Ovarian.  Tubal.  Peritoneal cancers.  Results of the assessment will determine the need for genetic counseling and BRCA1 and BRCA2 testing. Cervical Cancer Your health care provider may recommend that you be screened regularly for cancer of the pelvic organs (ovaries, uterus, and  vagina). This screening involves a pelvic examination, including checking for microscopic changes to the surface of your cervix (Pap test). You may be encouraged to have this screening done every 3 years, beginning at age 21.  For women ages 30-65, health care providers may recommend pelvic exams and Pap testing every 3 years, or they may recommend the Pap and pelvic exam, combined with testing for human papilloma virus (HPV), every 5 years. Some types of HPV increase your risk of cervical cancer. Testing for HPV may also be done on women of any age with unclear Pap test results.  Other health care providers may not recommend any screening for nonpregnant women who are considered low risk for pelvic cancer and who do not have symptoms. Ask your health care provider if a screening pelvic exam is right for you.  If you have had past treatment for cervical cancer or a condition that could lead to cancer, you need Pap tests and screening for cancer for at least 20 years after your treatment. If Pap tests have been discontinued, your risk factors (such as having a new sexual partner) need to be reassessed to determine if screening should resume. Some women have medical problems that increase the chance of getting cervical cancer. In these cases, your health care provider may recommend more frequent screening and Pap tests. Colorectal Cancer  This type of cancer can be detected and often prevented.  Routine colorectal cancer screening usually begins at 50 years of age and continues through 51 years of age.  Your health care provider may recommend screening at an earlier age if you have risk factors for colon cancer.  Your health care provider may also recommend using home test kits to check for hidden blood in the stool.  A small camera at the end of a tube can be used to examine your colon directly (sigmoidoscopy or colonoscopy). This is done to check for the earliest forms of colorectal  cancer.  Routine screening usually begins at age 50.  Direct examination of the colon should be repeated every 5-10 years through 51 years of age. However, you may need to be screened more often if early forms of precancerous polyps or small growths are found. Skin Cancer  Check your skin from head to toe regularly.  Tell your health care provider about any new moles or changes in moles, especially if there is a change in a mole's shape or color.  Also tell your health care provider if you have a mole that is larger than the size of a pencil eraser.  Always use sunscreen. Apply sunscreen liberally and repeatedly throughout the day.  Protect yourself by wearing long sleeves, pants, a wide-brimmed hat, and sunglasses whenever you are outside. HEART DISEASE, DIABETES, AND HIGH BLOOD PRESSURE   High blood pressure causes heart disease and increases the risk of stroke. High blood pressure is more likely to develop in:  People who have blood pressure in the high end   of the normal range (130-139/85-89 mm Hg).  People who are overweight or obese.  People who are African American.  If you are 38-23 years of age, have your blood pressure checked every 3-5 years. If you are 61 years of age or older, have your blood pressure checked every year. You should have your blood pressure measured twice--once when you are at a hospital or clinic, and once when you are not at a hospital or clinic. Record the average of the two measurements. To check your blood pressure when you are not at a hospital or clinic, you can use:  An automated blood pressure machine at a pharmacy.  A home blood pressure monitor.  If you are between 45 years and 39 years old, ask your health care provider if you should take aspirin to prevent strokes.  Have regular diabetes screenings. This involves taking a blood sample to check your fasting blood sugar level.  If you are at a normal weight and have a low risk for diabetes,  have this test once every three years after 51 years of age.  If you are overweight and have a high risk for diabetes, consider being tested at a younger age or more often. PREVENTING INFECTION  Hepatitis B  If you have a higher risk for hepatitis B, you should be screened for this virus. You are considered at high risk for hepatitis B if:  You were born in a country where hepatitis B is common. Ask your health care provider which countries are considered high risk.  Your parents were born in a high-risk country, and you have not been immunized against hepatitis B (hepatitis B vaccine).  You have HIV or AIDS.  You use needles to inject street drugs.  You live with someone who has hepatitis B.  You have had sex with someone who has hepatitis B.  You get hemodialysis treatment.  You take certain medicines for conditions, including cancer, organ transplantation, and autoimmune conditions. Hepatitis C  Blood testing is recommended for:  Everyone born from [birth year] through 1967.  Anyone with known risk factors for hepatitis C. Sexually transmitted infections (STIs)  You should be screened for sexually transmitted infections (STIs) including gonorrhea and chlamydia if:  You are sexually active and are younger than 51 years of age.  You are older than 51 years of age and your health care provider tells you that you are at risk for this type of infection.  Your sexual activity has changed since you were last screened and you are at an increased risk for chlamydia or gonorrhea. Ask your health care provider if you are at risk.  If you do not have HIV, but are at risk, it may be recommended that you take a prescription medicine daily to prevent HIV infection. This is called pre-exposure prophylaxis (PrEP). You are considered at risk if:  You are sexually active and do not regularly use condoms or know the HIV status of your partner(s).  You take drugs by injection.  You are sexually  active with a partner who has HIV. Talk with your health care provider about whether you are at high risk of being infected with HIV. If you choose to begin PrEP, you should first be tested for HIV. You should then be tested every 3 months for as long as you are taking PrEP.  PREGNANCY   If you are premenopausal and you may become pregnant, ask your health care provider about preconception counseling.  If you may  become pregnant, take 400 to 800 micrograms (mcg) of folic acid every day.  If you want to prevent pregnancy, talk to your health care provider about birth control (contraception). OSTEOPOROSIS AND MENOPAUSE   Osteoporosis is a disease in which the bones lose minerals and strength with aging. This can result in serious bone fractures. Your risk for osteoporosis can be identified using a bone density scan.  If you are 61 years of age or older, or if you are at risk for osteoporosis and fractures, ask your health care provider if you should be screened.  Ask your health care provider whether you should take a calcium or vitamin D supplement to lower your risk for osteoporosis.  Menopause may have certain physical symptoms and risks.  Hormone replacement therapy may reduce some of these symptoms and risks. Talk to your health care provider about whether hormone replacement therapy is right for you.  HOME CARE INSTRUCTIONS   Schedule regular health, dental, and eye exams.  Stay current with your immunizations.   Do not use any tobacco products including cigarettes, chewing tobacco, or electronic cigarettes.  If you are pregnant, do not drink alcohol.  If you are breastfeeding, limit how much and how often you drink alcohol.  Limit alcohol intake to no more than 1 drink per day for nonpregnant women. One drink equals 12 ounces of beer, 5 ounces of wine, or 1 ounces of hard liquor.  Do not use street drugs.  Do not share needles.  Ask your health care provider for help if  you need support or information about quitting drugs.  Tell your health care provider if you often feel depressed.  Tell your health care provider if you have ever been abused or do not feel safe at home.   This information is not intended to replace advice given to you by your health care provider. Make sure you discuss any questions you have with your health care provider.   Document Released: 04/18/2011 Document Revised: 10/24/2014 Document Reviewed: 09/04/2013 Elsevier Interactive Patient Education [employer].

## 2015-08-11 NOTE — Assessment & Plan Note (Signed)
BP Readings from Last 3 Encounters:  08/11/15 163/93  08/05/14 140/80  09/27/13 126/82   BP elevated. Will check renal function with labs. Repeat BP check in 2 weeks.

## 2015-08-11 NOTE — Assessment & Plan Note (Signed)
Mammogram ordered

## 2015-08-11 NOTE — Assessment & Plan Note (Signed)
Referral placed for colonoscopy. 

## 2015-08-11 NOTE — Progress Notes (Signed)
Subjective:    Patient ID: Jill Atkinson, female    DOB: [birth year], 49 y.o.   MRN: [id number]  HPI  49YO female presents for physical exam.  Feeling well. No changes to medical history.  BP has been elevated at home 027-741 systolic. Gave blood recently and BP was 120/80.   Wt Readings from Last 3 Encounters:  08/11/15 182 lb 2 oz (82.611 kg)  08/05/14 172 lb 12 oz (78.359 kg)  09/27/13 180 lb (81.647 kg)   BP Readings from Last 3 Encounters:  08/11/15 163/93  08/05/14 140/80  09/27/13 126/82    Past Medical History  Diagnosis Date  . Asthma     exercise induced  . Chicken pox   . Shingles   . Allergy     hay fever  . Arrhythmia     AVNRT, s/p ablation Dr. Westley Gambles at [facility]  . Hypertension   . UTI (lower urinary tract infection)   . Depression     post partum   Family History  Problem Relation Age of Onset  . Arthritis Mother   . Hypertension Mother   . Arthritis Father   . Alcohol abuse Maternal Grandmother   . Drug abuse Maternal Grandmother   . Arthritis Maternal Grandmother   . Stroke Maternal Grandmother   . Hypertension Maternal Grandmother   . Alcohol abuse Maternal Grandfather   . Drug abuse Maternal Grandfather   . Arthritis Maternal Grandfather   . Hypertension Maternal Grandfather    Past Surgical History  Procedure Laterality Date  . Tonsillectomy  1978  . Cardiac electrophysiology study and ablation  2012   . Nasal sinus surgery  2006  . Dilation and curettage of uterus  1995    post delivery   Social History   Social History  . Marital Status: Married    Spouse Name: N/A  . Number of Children: N/A  . Years of Education: N/A   Social History Main Topics  . Smoking status: Never Smoker   . Smokeless tobacco: None  . Alcohol Use: Yes  . Drug Use: No  . Sexual Activity: Not Asked   Other Topics Concern  . None   Social History Narrative   Lives in [place].      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  Eyes: Negative for visual disturbance.  Respiratory: Negative for shortness of breath.   Cardiovascular: Negative for chest pain and leg swelling.  Gastrointestinal: Negative for nausea, vomiting, abdominal pain, diarrhea and constipation.  Genitourinary: Negative for dysuria, menstrual problem and pelvic pain.  Musculoskeletal: Negative for myalgias and arthralgias.  Skin: Negative for color change and rash.  Neurological: Negative for weakness.  Hematological: Negative for adenopathy. Does not bruise/bleed easily.  Psychiatric/Behavioral: Negative for sleep disturbance and dysphoric mood. The patient is not nervous/anxious.        Objective:    BP 163/93 mmHg  Pulse 89  Temp(Src) 98.1 F (36.7 C) (Oral)  Ht 5' 5.75" (1.67 m)  Wt 182 lb 2 oz (82.611 kg)  BMI 29.62 kg/m2  SpO2 98%  LMP 03/02/2015 Physical Exam  Constitutional: She is oriented to person, place, and time. She appears well-developed and well-nourished. No distress.  HENT:  Head: Normocephalic and atraumatic.  Right Ear: External ear normal.  Left Ear: External ear normal.  Nose:  Nose normal.  Mouth/Throat: Oropharynx is clear and moist. No oropharyngeal exudate.  Eyes: Conjunctivae are normal. Pupils are equal, round, and reactive to light. Right eye exhibits no discharge. Left eye exhibits no discharge. No scleral icterus.  Neck: Normal range of motion. Neck supple. No tracheal deviation present. No thyromegaly present.  Cardiovascular: Normal rate, regular rhythm, normal heart sounds and intact distal pulses.  Exam reveals no gallop and no friction rub.   No murmur heard. Pulmonary/Chest: Effort normal and breath sounds normal. No respiratory distress. She has no wheezes. She has no rales. She exhibits no tenderness.  Abdominal: Soft. Bowel sounds are normal. She  exhibits no distension and no mass. There is no tenderness. There is no rebound and no guarding.  Genitourinary: Rectum normal, vagina normal and uterus normal. No breast swelling, tenderness, discharge or bleeding. Pelvic exam was performed with patient supine. There is no rash, tenderness or lesion on the right labia. There is no rash, tenderness or lesion on the left labia. Uterus is not enlarged and not tender. Cervix exhibits no motion tenderness, no discharge and no friability. Right adnexum displays no mass, no tenderness and no fullness. Left adnexum displays no mass, no tenderness and no fullness. No erythema or tenderness in the vagina. No vaginal discharge found.  Musculoskeletal: Normal range of motion. She exhibits no edema or tenderness.  Lymphadenopathy:    She has no cervical adenopathy.  Neurological: She is alert and oriented to person, place, and time. No cranial nerve deficit. She exhibits normal muscle tone. Coordination normal.  Skin: Skin is warm and dry. No rash noted. She is not diaphoretic. No erythema. No pallor.  Psychiatric: She has a normal mood and affect. Her behavior is normal. Judgment and thought content normal.          Assessment & Plan:   Problem List Items Addressed This Visit      Unprioritized   Elevated BP    BP Readings from Last 3 Encounters:  08/11/15 163/93  08/05/14 140/80  09/27/13 126/82   BP elevated. Will check renal function with labs. Repeat BP check in 2 weeks.      Routine general medical examination at a health care facility - Primary    General medical exam normal today including breast and pelvic exam. PAP pending. Mammogram ordered. Colonoscopy ordered. Labs today. Flu vaccine today. Encouraged healthy diet and exercise.       Relevant Orders   CBC with Differential/Platelet   Comprehensive metabolic panel   Lipid panel   Microalbumin / creatinine urine ratio   Vit D  25 hydroxy (rtn osteoporosis monitoring)   Hemoglobin  A1c   TSH   Screening for breast cancer    Mammogram ordered.      Relevant Orders   MM Digital Screening   Special screening for malignant neoplasms, colon    Referral placed for colonoscopy.      Relevant Orders   Ambulatory referral to Gastroenterology       Return in about 2 weeks (around 08/25/2015) for Recheck of Blood Pressure.

## 2015-08-12 LAB — CYTOLOGY - PAP

## 2015-08-17 ENCOUNTER — Other Ambulatory Visit: Payer: Self-pay | Admitting: Internal Medicine

## 2015-08-27 ENCOUNTER — Ambulatory Visit
Admission: RE | Admit: 2015-08-27 | Discharge: 2015-08-27 | Disposition: A | Discharge status: Home / Self Care | Payer: BLUE CROSS/BLUE SHIELD | Source: Ambulatory Visit | Attending: Internal Medicine | Admitting: Internal Medicine

## 2015-08-27 ENCOUNTER — Ambulatory Visit (INDEPENDENT_AMBULATORY_CARE_PROVIDER_SITE_OTHER): Payer: BLUE CROSS/BLUE SHIELD

## 2015-08-27 VITALS — BP 130/78 | HR 81 | Resp 18

## 2015-08-27 DIAGNOSIS — Z1239 Encounter for other screening for malignant neoplasm of breast: Secondary | ICD-10-CM

## 2015-08-27 DIAGNOSIS — R030 Elevated blood-pressure reading, without diagnosis of hypertension: Secondary | ICD-10-CM | POA: Diagnosis not present

## 2015-08-27 DIAGNOSIS — Z1231 Encounter for screening mammogram for malignant neoplasm of breast: Secondary | ICD-10-CM | POA: Insufficient documentation

## 2015-08-27 DIAGNOSIS — IMO0001 Reserved for inherently not codable concepts without codable children: Secondary | ICD-10-CM

## 2015-08-27 NOTE — Progress Notes (Signed)
OK. Fine to continue to monitor BP for now. Follow up in 3 months with repeat check.

## 2015-08-27 NOTE — Progress Notes (Signed)
Patient came in for BP recheck. Checked BP in bilaterally upper extremities, see vitals for details.    Please advise?

## 2015-08-27 NOTE — Progress Notes (Signed)
Left a VM for patient with instructions.

## 2015-08-31 ENCOUNTER — Other Ambulatory Visit: Payer: Self-pay | Admitting: Internal Medicine

## 2015-08-31 DIAGNOSIS — R928 Other abnormal and inconclusive findings on diagnostic imaging of breast: Secondary | ICD-10-CM

## 2015-09-14 ENCOUNTER — Ambulatory Visit
Admission: RE | Admit: 2015-09-14 | Discharge: 2015-09-14 | Disposition: A | Discharge status: Home / Self Care | Payer: BLUE CROSS/BLUE SHIELD | Source: Ambulatory Visit | Attending: Internal Medicine | Admitting: Internal Medicine

## 2015-09-14 DIAGNOSIS — R928 Other abnormal and inconclusive findings on diagnostic imaging of breast: Secondary | ICD-10-CM | POA: Diagnosis not present

## 2015-10-23 ENCOUNTER — Encounter: Payer: Self-pay | Admitting: Internal Medicine

## 2015-10-30 ENCOUNTER — Encounter: Payer: Self-pay | Admitting: *Deleted

## 2015-11-02 ENCOUNTER — Ambulatory Visit: Payer: BLUE CROSS/BLUE SHIELD | Admitting: Anesthesiology

## 2015-11-02 ENCOUNTER — Ambulatory Visit
Admission: RE | Admit: 2015-11-02 | Discharge: 2015-11-02 | Disposition: A | Discharge status: Home / Self Care | Payer: BLUE CROSS/BLUE SHIELD | Source: Ambulatory Visit | Attending: Unknown Physician Specialty | Admitting: Unknown Physician Specialty

## 2015-11-02 ENCOUNTER — Encounter: Payer: Self-pay | Admitting: *Deleted

## 2015-11-02 ENCOUNTER — Encounter
Admission: RE | Disposition: A | Discharge status: Home / Self Care | Payer: Self-pay | Source: Ambulatory Visit | Attending: Unknown Physician Specialty

## 2015-11-02 DIAGNOSIS — Z79899 Other long term (current) drug therapy: Secondary | ICD-10-CM | POA: Insufficient documentation

## 2015-11-02 DIAGNOSIS — Z8489 Family history of other specified conditions: Secondary | ICD-10-CM | POA: Diagnosis not present

## 2015-11-02 DIAGNOSIS — Z8261 Family history of arthritis: Secondary | ICD-10-CM | POA: Diagnosis not present

## 2015-11-02 DIAGNOSIS — Z823 Family history of stroke: Secondary | ICD-10-CM | POA: Diagnosis not present

## 2015-11-02 DIAGNOSIS — I499 Cardiac arrhythmia, unspecified: Secondary | ICD-10-CM | POA: Insufficient documentation

## 2015-11-02 DIAGNOSIS — Z7951 Long term (current) use of inhaled steroids: Secondary | ICD-10-CM | POA: Diagnosis not present

## 2015-11-02 DIAGNOSIS — Z8249 Family history of ischemic heart disease and other diseases of the circulatory system: Secondary | ICD-10-CM | POA: Insufficient documentation

## 2015-11-02 DIAGNOSIS — Z811 Family history of alcohol abuse and dependence: Secondary | ICD-10-CM | POA: Diagnosis not present

## 2015-11-02 DIAGNOSIS — J45990 Exercise induced bronchospasm: Secondary | ICD-10-CM | POA: Insufficient documentation

## 2015-11-02 DIAGNOSIS — I10 Essential (primary) hypertension: Secondary | ICD-10-CM | POA: Diagnosis not present

## 2015-11-02 DIAGNOSIS — D125 Benign neoplasm of sigmoid colon: Secondary | ICD-10-CM | POA: Insufficient documentation

## 2015-11-02 DIAGNOSIS — K640 First degree hemorrhoids: Secondary | ICD-10-CM | POA: Diagnosis not present

## 2015-11-02 DIAGNOSIS — Z1211 Encounter for screening for malignant neoplasm of colon: Secondary | ICD-10-CM | POA: Insufficient documentation

## 2015-11-02 DIAGNOSIS — Z8669 Personal history of other diseases of the nervous system and sense organs: Secondary | ICD-10-CM | POA: Diagnosis not present

## 2015-11-02 HISTORY — PX: COLONOSCOPY WITH PROPOFOL: SHX5780

## 2015-11-02 LAB — POCT PREGNANCY, URINE: PREG TEST UR: NEGATIVE

## 2015-11-02 LAB — HM COLONOSCOPY

## 2015-11-02 SURGERY — COLONOSCOPY WITH PROPOFOL
Anesthesia: General

## 2015-11-02 MED ORDER — FENTANYL CITRATE (PF) 100 MCG/2ML IJ SOLN
INTRAMUSCULAR | Status: DC | PRN
  Administered 2015-11-02: 50 ug via INTRAVENOUS

## 2015-11-02 MED ORDER — LIDOCAINE HCL (CARDIAC) 20 MG/ML IV SOLN
INTRAVENOUS | Status: DC | PRN
  Administered 2015-11-02: 100 mg via INTRAVENOUS

## 2015-11-02 MED ORDER — PROPOFOL 500 MG/50ML IV EMUL
INTRAVENOUS | Status: DC | PRN
  Administered 2015-11-02: 100 ug/kg/min via INTRAVENOUS

## 2015-11-02 MED ORDER — MIDAZOLAM HCL 5 MG/5ML IJ SOLN
INTRAMUSCULAR | Status: DC | PRN
Start: 2015-11-02 — End: 2015-11-02
  Administered 2015-11-02: 1 mg via INTRAVENOUS

## 2015-11-02 MED ORDER — SODIUM CHLORIDE 0.9 % IV SOLN: INTRAVENOUS | Status: DC

## 2015-11-02 MED ORDER — SODIUM CHLORIDE 0.9 % IV SOLN
INTRAVENOUS | Status: DC
  Administered 2015-11-02: 13:00:00 via INTRAVENOUS

## 2015-11-02 MED ORDER — PROPOFOL 10 MG/ML IV BOLUS
INTRAVENOUS | Status: DC | PRN
  Administered 2015-11-02: 80 mg via INTRAVENOUS

## 2015-11-02 NOTE — Op Note (Signed)
[facility] Gastroenterology Patient Name: Jill Atkinson Procedure Date: 11/02/2015 1:47 PM MRN: [id number] Account #: [id number] Date of Birth: Jun 28, 1966 Admit Type: Outpatient Age: 50 Room: [facility] ENDO ROOM 1 Gender: Female Note Status: Finalized Procedure:         Colonoscopy Indications:       Screening for colorectal malignant neoplasm Providers:         Manya Silvas, MD Referring MD:      Eduard Clos. Gilford Rile, MD (Referring MD) Medicines:         Propofol per Anesthesia Complications:     No immediate complications. Procedure:         Pre-Anesthesia Assessment:                    - After reviewing the risks and benefits, the patient was                     deemed in satisfactory condition to undergo the procedure.                    After obtaining informed consent, the colonoscope was                     passed under direct vision. Throughout the procedure, the                     patient's blood pressure, pulse, and oxygen saturations                     were monitored continuously. The Colonoscope was                     introduced through the anus and advanced to the the cecum,                     identified by appendiceal orifice and ileocecal valve. The                     patient tolerated the procedure well. Findings:      A diminutive polyp was found in the sigmoid colon. The polyp was       sessile. The polyp was removed with a hot snare. Resection and retrieval       were complete.      Internal hemorrhoids were found during endoscopy. The hemorrhoids were       small and Grade I (internal hemorrhoids that do not prolapse). Impression:        - One diminutive polyp in the sigmoid colon. Resected and                     retrieved.                    - Internal hemorrhoids. Recommendation:    - Await pathology results. Manya Silvas, MD 11/02/2015 2:23:28 PM This report has been signed electronically. Number of Addenda: 0 Note Initiated  On: 11/02/2015 1:47 PM Scope Withdrawal Time: 0 hours 13 minutes 0 seconds  Total Procedure Duration: 0 hours 17 minutes 57 seconds       [facility]

## 2015-11-02 NOTE — H&P (Signed)
Primary Care Physician:  Rica Mast, MD Primary Gastroenterologist:  Dr. Vira Agar  Pre-Procedure History & Physical: HPI:  Jill Atkinson is a 49 y.o. female is here for an colonoscopy.   Past Medical History  Diagnosis Date  . Asthma     exercise induced  . Chicken pox   . Shingles   . Allergy     hay fever  . Arrhythmia     AVNRT, s/p ablation Dr. Westley Gambles at [facility]  . Hypertension   . UTI (lower urinary tract infection)   . Depression     post partum    Past Surgical History  Procedure Laterality Date  . Tonsillectomy  1978  . Cardiac electrophysiology study and ablation  2012   . Nasal sinus surgery  2006  . Dilation and curettage of uterus  1995    post delivery    Prior to Admission medications   Medication Sig Start Date End Date Taking? Authorizing Provider  albuterol (VENTOLIN HFA) 108 (90 BASE) MCG/ACT inhaler Inhale 2 puffs into the lungs every 6 (six) hours as needed for wheezing. 08/05/14  Yes Jackolyn Confer, MD  buPROPion (WELLBUTRIN XL) 150 MG 24 hr tablet TAKE 1 TABLET (150 MG TOTAL) BY MOUTH DAILY. 08/11/15  Yes Jackolyn Confer, MD  cetirizine (ZYRTEC) 10 MG tablet Take 10 mg by mouth daily.   Yes Historical Provider, MD  Cholecalciferol (VITAMIN D) 2000 UNITS CAPS Take by mouth.   Yes Historical Provider, MD  fish oil-omega-3 fatty acids 1000 MG capsule Take 2 g by mouth daily.   Yes Historical Provider, MD  Fluticasone Propionate (FLONASE NA) Place into the nose.   Yes Historical Provider, MD  buPROPion (WELLBUTRIN XL) 150 MG 24 hr tablet TAKE 1 TABLET (150 MG TOTAL) BY MOUTH DAILY. 08/17/15   Jackolyn Confer, MD    Allergies as of 09/22/2015  . (No Known Allergies)    Family History  Problem Relation Age of Onset  . Arthritis Mother   . Hypertension Mother   . Arthritis Father   . Alcohol abuse Maternal Grandmother   . Drug abuse Maternal Grandmother   . Arthritis Maternal Grandmother   . Stroke Maternal Grandmother   .  Hypertension Maternal Grandmother   . Alcohol abuse Maternal Grandfather   . Drug abuse Maternal Grandfather   . Arthritis Maternal Grandfather   . Hypertension Maternal Grandfather     Social History   Social History  . Marital Status: Married    Spouse Name: N/A  . Number of Children: N/A  . Years of Education: N/A   Occupational History  . Not on file.   Social History Main Topics  . Smoking status: Never Smoker   . Smokeless tobacco: Not on file  . Alcohol Use: 1.2 oz/week    2 Glasses of wine per week  . Drug Use: No  . Sexual Activity: Not on file   Other Topics Concern  . Not on file   Social History Narrative   Lives in [place].      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 Neck:  Supple; no masses or thyromegaly. Lungs:  Clear throughout to auscultation.    Heart:  Regular rate and rhythm. Abdomen:  Soft, nontender and nondistended. Normal bowel sounds, without guarding, and without rebound.   Neurologic:  Alert and  oriented x4;  grossly normal neurologically.  Impression/Plan: Jill Atkinson is here for an colonoscopy to be performed for screening  Risks, benefits, limitations, and alternatives regarding  colonoscopy have been reviewed with the patient.  Questions have been answered.  All parties agreeable.   Gaylyn Cheers, MD  11/02/2015, 1:53 PM

## 2015-11-02 NOTE — Anesthesia Postprocedure Evaluation (Signed)
Anesthesia Post Note  Patient: Jill Atkinson  Procedure(s) Performed: Procedure(s) (LRB): COLONOSCOPY WITH PROPOFOL (N/A)  Patient location during evaluation: Endoscopy Anesthesia Type: General Level of consciousness: awake and alert Pain management: pain level controlled Vital Signs Assessment: post-procedure vital signs reviewed and stable Respiratory status: spontaneous breathing, nonlabored ventilation, respiratory function stable and patient connected to nasal cannula oxygen Cardiovascular status: blood pressure returned to baseline and stable Postop Assessment: no signs of nausea or vomiting Anesthetic complications: no    Last Vitals:  Filed Vitals:   11/02/15 1428 11/02/15 1430  BP: 133/77 159/93  Pulse: 92 92  Temp: 37 C   Resp: 19 14    Last Pain: There were no vitals filed for this visit.               Martha Clan

## 2015-11-02 NOTE — Transfer of Care (Signed)
Immediate Anesthesia Transfer of Care Note  Patient: Jill Atkinson  Procedure(s) Performed: Procedure(s): COLONOSCOPY WITH PROPOFOL (N/A)  Patient Location: PACU  Anesthesia Type:General  Level of Consciousness: awake, oriented and patient cooperative  Airway & Oxygen Therapy: Patient Spontanous Breathing and Patient connected to nasal cannula oxygen  Post-op Assessment: Report given to RN and Post -op Vital signs reviewed and stable  Post vital signs: Reviewed and stable  Last Vitals:  Filed Vitals:   11/02/15 1426 11/02/15 1428  BP:  133/77  Pulse:  92  Temp: 37 C 37 C  Resp:  19    Complications: No apparent anesthesia complications

## 2015-11-02 NOTE — Anesthesia Preprocedure Evaluation (Signed)
Anesthesia Evaluation  Patient identified by MRN, date of birth, ID band Patient awake    Reviewed: Allergy & Precautions, H&P , NPO status , Patient's Chart, lab work & pertinent test results, reviewed documented beta blocker date and time   History of Anesthesia Complications Negative for: history of anesthetic complications  Airway Mallampati: III  TM Distance: >3 FB Neck ROM: full    Dental no notable dental hx. (+) Teeth Intact, Caps, Chipped   Pulmonary neg shortness of breath, asthma , neg sleep apnea, neg COPD, neg recent URI,    Pulmonary exam normal breath sounds clear to auscultation       Cardiovascular Exercise Tolerance: Good hypertension, (-) angina(-) CAD, (-) Past MI, (-) Cardiac Stents and (-) CABG Normal cardiovascular exam+ dysrhythmias (s/p ablation) (-) Valvular Problems/Murmurs Rhythm:regular Rate:Normal     Neuro/Psych PSYCHIATRIC DISORDERS (Depression) negative neurological ROS     GI/Hepatic negative GI ROS, Neg liver ROS,   Endo/Other  negative endocrine ROS  Renal/GU negative Renal ROS  negative genitourinary   Musculoskeletal   Abdominal   Peds  Hematology negative hematology ROS (+)   Anesthesia Other Findings Past Medical History:   Asthma                                                         Comment:exercise induced   Chicken pox                                                  Shingles                                                     Allergy                                                        Comment:hay fever   Arrhythmia                                                     Comment:AVNRT, s/p ablation Dr. Westley [facility]les at [facility]   Hypertension                                                 UTI (lower urinary tract infection)                          Depression  Comment:post partum   Reproductive/Obstetrics negative OB ROS                             Anesthesia Physical Anesthesia Plan  ASA: II  Anesthesia Plan: General   Post-op Pain Management:    Induction:   Airway Management Planned:   Additional Equipment:   Intra-op Plan:   Post-operative Plan:   Informed Consent: I have reviewed the patients History and Physical, chart, labs and discussed the procedure including the risks, benefits and alternatives for the proposed anesthesia with the patient or authorized representative who has indicated his/her understanding and acceptance.   Dental Advisory Given  Plan Discussed with: Anesthesiologist, CRNA and Surgeon  Anesthesia Plan Comments:         Anesthesia Quick Evaluation

## 2015-11-04 ENCOUNTER — Encounter: Payer: Self-pay | Admitting: Unknown Physician Specialty

## 2015-11-04 LAB — SURGICAL PATHOLOGY

## 2015-12-22 ENCOUNTER — Encounter: Payer: Self-pay | Admitting: *Deleted

## 2016-01-25 ENCOUNTER — Other Ambulatory Visit: Payer: Self-pay

## 2016-01-25 MED ORDER — BUPROPION HCL ER (XL) 150 MG PO TB24: ORAL_TABLET | ORAL | Status: DC

## 2016-01-25 NOTE — Telephone Encounter (Signed)
Please advise refill last OV was 08/11/2015? Thanks

## 2016-04-11 ENCOUNTER — Encounter: Payer: Self-pay | Admitting: Internal Medicine

## 2016-04-11 ENCOUNTER — Ambulatory Visit (INDEPENDENT_AMBULATORY_CARE_PROVIDER_SITE_OTHER): Payer: BLUE CROSS/BLUE SHIELD | Admitting: Internal Medicine

## 2016-04-11 VITALS — BP 152/104 | HR 94 | Ht 66.0 in | Wt 177.6 lb

## 2016-04-11 DIAGNOSIS — R030 Elevated blood-pressure reading, without diagnosis of hypertension: Secondary | ICD-10-CM

## 2016-04-11 DIAGNOSIS — N959 Unspecified menopausal and perimenopausal disorder: Secondary | ICD-10-CM

## 2016-04-11 DIAGNOSIS — IMO0001 Reserved for inherently not codable concepts without codable children: Secondary | ICD-10-CM

## 2016-04-11 MED ORDER — BUPROPION HCL ER (XL) 300 MG PO TB24
300.0000 mg | ORAL_TABLET | Freq: Every day | ORAL | Status: DC
Start: 2016-04-11 — End: 2016-10-06

## 2016-04-11 NOTE — Progress Notes (Signed)
Pre visit review using our clinic review tool, if applicable. No additional management support is needed unless otherwise documented below in the visit note. 

## 2016-04-11 NOTE — Assessment & Plan Note (Signed)
Worsening symptoms of anxiety and depressed mood. Will increase Wellbutrin to 300mg  daily. Discussed adding Fluoxetine at next visit if symptoms not improved. Follow up in 2 weeks and prn.

## 2016-04-11 NOTE — Patient Instructions (Signed)
Increase Wellbutrin to 300mg  daily.  Follow up in 2 weeks.

## 2016-04-11 NOTE — Progress Notes (Signed)
Subjective:    Patient ID: Jill Atkinson, female    DOB: 1966-05-30, 50 y.o.   MRN: [id number]  HPI  50YO female presents for acute visit.  Recent worsening symptoms of depressed mood, anxiety, tearfulness. Things are going well at home. Daughter finished college and is starting [employer]. Son is doing well in college. She notes some stress with work. Taking Wellbutrin 150mg  daily with some improvement. Also notes some hot flashes during day.     Wt Readings from Last 3 Encounters:  04/11/16 177 lb 9.6 oz (80.559 kg)  11/02/15 175 lb (79.379 kg)  08/11/15 182 lb 2 oz (82.611 kg)   BP Readings from Last 3 Encounters:  04/11/16 152/104  11/02/15 153/97  08/27/15 130/78    Past Medical History  Diagnosis Date  . Asthma     exercise induced  . Chicken pox   . Shingles   . Allergy     hay fever  . Arrhythmia     AVNRT, s/p ablation Dr. Westley Gambles at [facility]  . Hypertension   . UTI (lower urinary tract infection)   . Depression     post partum   Family History  Problem Relation Age of Onset  . Arthritis Mother   . Hypertension Mother   . Arthritis Father   . Alcohol abuse Maternal Grandmother   . Drug abuse Maternal Grandmother   . Arthritis Maternal Grandmother   . Stroke Maternal Grandmother   . Hypertension Maternal Grandmother   . Alcohol abuse Maternal Grandfather   . Drug abuse Maternal Grandfather   . Arthritis Maternal Grandfather   . Hypertension Maternal Grandfather    Past Surgical History  Procedure Laterality Date  . Tonsillectomy  1978  . Cardiac electrophysiology study and ablation  2012   . Nasal sinus surgery  2006  . Dilation and curettage of uterus  1995    post delivery  . Colonoscopy with propofol N/A 11/02/2015    Procedure: COLONOSCOPY WITH PROPOFOL;  Surgeon: Manya Silvas, MD;  Location: [facility] ENDOSCOPY;  Service: Endoscopy;  Laterality: N/A;   Social History   Social History  . Marital Status: Married    Spouse Name: N/A    . Number of Children: N/A  . Years of Education: N/A   Social History Main Topics  . Smoking status: Never Smoker   . Smokeless tobacco: None  . Alcohol Use: 1.2 oz/week    2 Glasses of wine per week  . Drug Use: No  . Sexual Activity: Not Asked   Other Topics Concern  . None   Social History Narrative   Lives in [place].      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  Eyes: Negative for visual disturbance.  Respiratory: Negative for shortness of breath.   Cardiovascular: Negative for chest pain and leg swelling.  Gastrointestinal: Negative for abdominal pain.  Skin: Negative for color change and rash.  Hematological: Negative for adenopathy. Does not bruise/bleed easily.  Psychiatric/Behavioral: Positive for sleep disturbance and dysphoric mood. Negative for suicidal ideas, behavioral problems, confusion, decreased concentration and agitation. The patient is nervous/anxious.        Objective:    BP 152/104 mmHg  Pulse 94  Ht 5\' 6"  (1.676 m)  Wt 177 lb 9.6 oz (80.559 kg)  BMI 28.68 kg/m2  SpO2 98%  LMP 08/02/2015 Physical Exam  Constitutional: She is oriented to person, place, and time. She appears well-developed and well-nourished. No distress.  HENT:  Head: Normocephalic and atraumatic.  Right Ear: External ear normal.  Left Ear: External ear normal.  Nose: Nose normal.  Mouth/Throat: Oropharynx is clear and moist. No oropharyngeal exudate.  Eyes: Conjunctivae are normal. Pupils are equal, round, and reactive to light. Right eye exhibits no discharge. Left eye exhibits no discharge. No scleral icterus.  Neck: Normal range of motion. Neck supple. No tracheal deviation present. No thyromegaly present.  Cardiovascular: Normal rate, regular rhythm, normal heart sounds and intact distal pulses.  Exam reveals no  gallop and no friction rub.   No murmur heard. Pulmonary/Chest: Effort normal and breath sounds normal. No respiratory distress. She has no wheezes. She has no rales. She exhibits no tenderness.  Musculoskeletal: Normal range of motion. She exhibits no edema or tenderness.  Lymphadenopathy:    She has no cervical adenopathy.  Neurological: She is alert and oriented to person, place, and time. No cranial nerve deficit. She exhibits normal muscle tone. Coordination normal.  Skin: Skin is warm and dry. No rash noted. She is not diaphoretic. No erythema. No pallor.  Psychiatric: Her speech is normal and behavior is normal. Judgment and thought content normal. Her mood appears anxious. Cognition and memory are normal. She exhibits a depressed mood. She expresses no suicidal ideation.          Assessment & Plan:   Problem List Items Addressed This Visit      Unprioritized   Elevated BP    BP Readings from Last 3 Encounters:  04/11/16 152/104  11/02/15 153/97  08/27/15 130/78   BP elevated, however she is crying in office today. Will monitor and recheck at visit in 2 weeks.      Menopausal disorder - Primary    Worsening symptoms of anxiety and depressed mood. Will increase Wellbutrin to 300mg  daily. Discussed adding Fluoxetine at next visit if symptoms not improved. Follow up in 2 weeks and prn.          Return in about 2 weeks (around 04/25/2016) for Recheck.  Ronette Deter, MD Internal Medicine [facility]

## 2016-04-11 NOTE — Assessment & Plan Note (Signed)
BP Readings from Last 3 Encounters:  04/11/16 152/104  11/02/15 153/97  08/27/15 130/78   BP elevated, however she is crying in office today. Will monitor and recheck at visit in 2 weeks.

## 2016-04-27 ENCOUNTER — Encounter: Payer: Self-pay | Admitting: Internal Medicine

## 2016-04-27 ENCOUNTER — Ambulatory Visit (INDEPENDENT_AMBULATORY_CARE_PROVIDER_SITE_OTHER): Payer: BLUE CROSS/BLUE SHIELD | Admitting: Internal Medicine

## 2016-04-27 VITALS — BP 190/110 | HR 90 | Ht 66.0 in | Wt 176.4 lb

## 2016-04-27 DIAGNOSIS — R030 Elevated blood-pressure reading, without diagnosis of hypertension: Secondary | ICD-10-CM

## 2016-04-27 DIAGNOSIS — N959 Unspecified menopausal and perimenopausal disorder: Secondary | ICD-10-CM

## 2016-04-27 DIAGNOSIS — IMO0001 Reserved for inherently not codable concepts without codable children: Secondary | ICD-10-CM

## 2016-04-27 DIAGNOSIS — I10 Essential (primary) hypertension: Secondary | ICD-10-CM

## 2016-04-27 MED ORDER — ALBUTEROL SULFATE HFA 108 (90 BASE) MCG/ACT IN AERS: 2.0000 | INHALATION_SPRAY | Freq: Four times a day (QID) | RESPIRATORY_TRACT | Status: DC | PRN

## 2016-04-27 MED ORDER — FLUOXETINE HCL 20 MG PO TABS: 20.0000 mg | ORAL_TABLET | Freq: Every day | ORAL | Status: DC

## 2016-04-27 MED ORDER — AMLODIPINE BESYLATE 2.5 MG PO TABS: 2.5000 mg | ORAL_TABLET | Freq: Every day | ORAL | Status: DC

## 2016-04-27 NOTE — Assessment & Plan Note (Signed)
No improvement with increase in wellbutrin. Will add Fluoxetine 20mg  daily. Discussed potential benefits and risks of medications. Encouraged her to consider counseling. She declines.

## 2016-04-27 NOTE — Progress Notes (Signed)
Pre visit review using our clinic review tool, if applicable. No additional management support is needed unless otherwise documented below in the visit note. 

## 2016-04-27 NOTE — Progress Notes (Signed)
Subjective:    Patient ID: Jill Atkinson, female    DOB: 19-Jan-1966, 50 y.o.   MRN: [id number]  HPI  50YO female presents for follow up.  Recently seen 6/26 for elevated BP and worsening anxiety and depression. Increased wellbutrin to 300mg  daily. No improvement with this. Frequently tearful. Sleeping well. Had to leave work on one occasion. Feels this is hormonal. No new stressors at home or work.  HTN - BP at home mostly [id number] systolic on home cuff. No CP, HA, palpitations.    Wt Readings from Last 3 Encounters:  04/27/16 176 lb 6.4 oz (80.015 kg)  04/11/16 177 lb 9.6 oz (80.559 kg)  11/02/15 175 lb (79.379 kg)   BP Readings from Last 3 Encounters:  04/27/16 190/110  04/11/16 152/104  11/02/15 153/97    Past Medical History  Diagnosis Date  . Asthma     exercise induced  . Chicken pox   . Shingles   . Allergy     hay fever  . Arrhythmia     AVNRT, s/p ablation Dr. Westley Gambles at [facility]  . Hypertension   . UTI (lower urinary tract infection)   . Depression     post partum   Family History  Problem Relation Age of Onset  . Arthritis Mother   . Hypertension Mother   . Arthritis Father   . Alcohol abuse Maternal Grandmother   . Drug abuse Maternal Grandmother   . Arthritis Maternal Grandmother   . Stroke Maternal Grandmother   . Hypertension Maternal Grandmother   . Alcohol abuse Maternal Grandfather   . Drug abuse Maternal Grandfather   . Arthritis Maternal Grandfather   . Hypertension Maternal Grandfather    Past Surgical History  Procedure Laterality Date  . Tonsillectomy  1978  . Cardiac electrophysiology study and ablation  2012   . Nasal sinus surgery  2006  . Dilation and curettage of uterus  1995    post delivery  . Colonoscopy with propofol N/A 11/02/2015    Procedure: COLONOSCOPY WITH PROPOFOL;  Surgeon: Manya Silvas, MD;  Location: [facility] ENDOSCOPY;  Service: Endoscopy;  Laterality: N/A;   Social History   Social History  . Marital  Status: Married    Spouse Name: N/A  . Number of Children: N/A  . Years of Education: N/A   Social History Main Topics  . Smoking status: Never Smoker   . Smokeless tobacco: None  . Alcohol Use: 1.2 oz/week    2 Glasses of wine per week  . Drug Use: No  . Sexual Activity: Not Asked   Other Topics Concern  . None   Social History Narrative   Lives in [place].      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  Eyes: Negative for visual disturbance.  Respiratory: Negative for shortness of breath.   Cardiovascular: Negative for chest pain, palpitations and leg swelling.  Gastrointestinal: Negative for abdominal pain, diarrhea and constipation.  Musculoskeletal: Negative for arthralgias.  Skin: Negative for color change and rash.  Hematological: Negative for adenopathy. Does not bruise/bleed easily.  Psychiatric/Behavioral: Positive for dysphoric mood and agitation. Negative for suicidal ideas and sleep disturbance. The patient is nervous/anxious.        Objective:    BP 190/110 mmHg  Pulse 90  Ht 5\' 6"  (1.676 m)  Wt 176 lb 6.4 oz (80.015 kg)  BMI 28.49 kg/m2  SpO2 98%  LMP 08/02/2015 Physical Exam  Constitutional: She is oriented to person, place, and time. She appears well-developed and well-nourished. No distress.  HENT:  Head: Normocephalic and atraumatic.  Right Ear: External ear normal.  Left Ear: External ear normal.  Nose: Nose normal.  Mouth/Throat: Oropharynx is clear and moist. No oropharyngeal exudate.  Eyes: Conjunctivae are normal. Pupils are equal, round, and reactive to light. Right eye exhibits no discharge. Left eye exhibits no discharge. No scleral icterus.  Neck: Normal range of motion. Neck supple. No tracheal deviation present. No thyromegaly present.  Cardiovascular: Normal rate, regular rhythm,  normal heart sounds and intact distal pulses.  Exam reveals no gallop and no friction rub.   No murmur heard. Pulmonary/Chest: Effort normal and breath sounds normal. No respiratory distress. She has no wheezes. She has no rales. She exhibits no tenderness.  Musculoskeletal: Normal range of motion. She exhibits no edema or tenderness.  Lymphadenopathy:    She has no cervical adenopathy.  Neurological: She is alert and oriented to person, place, and time. No cranial nerve deficit. She exhibits normal muscle tone. Coordination normal.  Skin: Skin is warm and dry. No rash noted. She is not diaphoretic. No erythema. No pallor.  Psychiatric: Her speech is normal and behavior is normal. Judgment and thought content normal. Her mood appears anxious. She exhibits a depressed mood. She expresses no suicidal ideation.  Tearful          Assessment & Plan:   Problem List Items Addressed This Visit      Unprioritized   Hypertension    BP Readings from Last 3 Encounters:  04/27/16 190/110  04/11/16 152/104  11/02/15 153/97   BP continues to be very high, however she is crying during exam today. Will add Amlodipine 2.5mg  daily. Recheck BP Friday.      Relevant Medications   amLODipine (NORVASC) 2.5 MG tablet   Menopausal disorder - Primary    No improvement with increase in wellbutrin. Will add Fluoxetine 20mg  daily. Discussed potential benefits and risks of medications. Encouraged her to consider counseling. She declines.      Relevant Medications   FLUoxetine (PROZAC) 20 MG tablet       Return in about 2 days (around 04/29/2016) for Recheck.  Ronette Deter, MD Internal Medicine [facility]

## 2016-04-27 NOTE — Patient Instructions (Signed)
Start Fluoxetine 20mg  daily.  Continue Wellbutrin.  Start Amlodipine 2.5mg  daily to help with blood pressure.

## 2016-04-27 NOTE — Assessment & Plan Note (Signed)
BP Readings from Last 3 Encounters:  04/27/16 190/110  04/11/16 152/104  11/02/15 153/97   BP continues to be very high, however she is crying during exam today. Will add Amlodipine 2.5mg  daily. Recheck BP Friday.

## 2016-05-02 ENCOUNTER — Ambulatory Visit (INDEPENDENT_AMBULATORY_CARE_PROVIDER_SITE_OTHER): Payer: BLUE CROSS/BLUE SHIELD | Admitting: Internal Medicine

## 2016-05-02 ENCOUNTER — Encounter: Payer: Self-pay | Admitting: Internal Medicine

## 2016-05-02 VITALS — BP 166/98 | HR 92 | Ht 66.0 in | Wt 176.4 lb

## 2016-05-02 DIAGNOSIS — I10 Essential (primary) hypertension: Secondary | ICD-10-CM

## 2016-05-02 DIAGNOSIS — IMO0001 Reserved for inherently not codable concepts without codable children: Secondary | ICD-10-CM

## 2016-05-02 DIAGNOSIS — R030 Elevated blood-pressure reading, without diagnosis of hypertension: Secondary | ICD-10-CM

## 2016-05-02 DIAGNOSIS — N959 Unspecified menopausal and perimenopausal disorder: Secondary | ICD-10-CM

## 2016-05-02 MED ORDER — AMLODIPINE BESYLATE 5 MG PO TABS: 5.0000 mg | ORAL_TABLET | Freq: Every day | ORAL | Status: DC

## 2016-05-02 NOTE — Progress Notes (Signed)
Pre visit review using our clinic review tool, if applicable. No additional management support is needed unless otherwise documented below in the visit note. 

## 2016-05-02 NOTE — Assessment & Plan Note (Signed)
BP Readings from Last 3 Encounters:  05/02/16 166/98  04/27/16 190/110  04/11/16 152/104   BP continues to be elevated. Will increase Amlodipine to 5mg  daily.Recheck in 2 weeks.

## 2016-05-02 NOTE — Progress Notes (Signed)
Subjective:    Patient ID: Jill Atkinson, female    DOB: 1966-09-03, 50 y.o.   MRN: [id number]  HPI  50YO female presents for follow up.  Recently seen for elevated BP and menopausal symptoms. Started on Amlodipine and Fluoxetine.  BP has been running 160/100s. Complaint with medication. No side effects noted. No CP, HA, palpitations.  No improvement noted yet in anxiety and depression symptoms. However, has only been taking Fluoxetine for 5 days.    Wt Readings from Last 3 Encounters:  05/02/16 176 lb 6.4 oz (80.015 kg)  04/27/16 176 lb 6.4 oz (80.015 kg)  04/11/16 177 lb 9.6 oz (80.559 kg)   BP Readings from Last 3 Encounters:  05/02/16 166/98  04/27/16 190/110  04/11/16 152/104    Past Medical History  Diagnosis Date  . Asthma     exercise induced  . Chicken pox   . Shingles   . Allergy     hay fever  . Arrhythmia     AVNRT, s/p ablation Dr. Westley Gambles at [facility]  . Hypertension   . UTI (lower urinary tract infection)   . Depression     post partum   Family History  Problem Relation Age of Onset  . Arthritis Mother   . Hypertension Mother   . Arthritis Father   . Alcohol abuse Maternal Grandmother   . Drug abuse Maternal Grandmother   . Arthritis Maternal Grandmother   . Stroke Maternal Grandmother   . Hypertension Maternal Grandmother   . Alcohol abuse Maternal Grandfather   . Drug abuse Maternal Grandfather   . Arthritis Maternal Grandfather   . Hypertension Maternal Grandfather    Past Surgical History  Procedure Laterality Date  . Tonsillectomy  1978  . Cardiac electrophysiology study and ablation  2012   . Nasal sinus surgery  2006  . Dilation and curettage of uterus  1995    post delivery  . Colonoscopy with propofol N/A 11/02/2015    Procedure: COLONOSCOPY WITH PROPOFOL;  Surgeon: Manya Silvas, MD;  Location: [facility] ENDOSCOPY;  Service: Endoscopy;  Laterality: N/A;   Social History   Social History  . Marital Status: Married    Spouse  Name: N/A  . Number of Children: N/A  . Years of Education: N/A   Social History Main Topics  . Smoking status: Never Smoker   . Smokeless tobacco: None  . Alcohol Use: 1.2 oz/week    2 Glasses of wine per week  . Drug Use: No  . Sexual Activity: Not Asked   Other Topics Concern  . None   Social History Narrative   Lives in [place].      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  Eyes: Negative for visual disturbance.  Respiratory: Negative for cough and shortness of breath.   Cardiovascular: Negative for chest pain, palpitations and leg swelling.  Gastrointestinal: Negative for abdominal pain, diarrhea and constipation.  Skin: Negative for color change and rash.  Neurological: Negative for headaches.  Hematological: Negative for adenopathy. Does not bruise/bleed easily.  Psychiatric/Behavioral: Negative for dysphoric mood. The patient is not nervous/anxious.        Objective:    BP 166/98 mmHg  Pulse 92  Ht 5\' 6"  (1.676 m)  Wt 176 lb 6.4 oz (80.015 kg)  BMI 28.49 kg/m2  SpO2  97%  LMP 08/02/2015 Physical Exam  Constitutional: She is oriented to person, place, and time. She appears well-developed and well-nourished. No distress.  HENT:  Head: Normocephalic and atraumatic.  Right Ear: External ear normal.  Left Ear: External ear normal.  Nose: Nose normal.  Mouth/Throat: Oropharynx is clear and moist. No oropharyngeal exudate.  Eyes: Conjunctivae are normal. Pupils are equal, round, and reactive to light. Right eye exhibits no discharge. Left eye exhibits no discharge. No scleral icterus.  Neck: Normal range of motion. Neck supple. No tracheal deviation present. No thyromegaly present.  Cardiovascular: Normal rate, regular rhythm, normal heart sounds and intact distal pulses.  Exam reveals no gallop and no friction  rub.   No murmur heard. Pulmonary/Chest: Effort normal and breath sounds normal. No respiratory distress. She has no wheezes. She has no rales. She exhibits no tenderness.  Musculoskeletal: Normal range of motion. She exhibits no edema or tenderness.  Lymphadenopathy:    She has no cervical adenopathy.  Neurological: She is alert and oriented to person, place, and time. No cranial nerve deficit. She exhibits normal muscle tone. Coordination normal.  Skin: Skin is warm and dry. No rash noted. She is not diaphoretic. No erythema. No pallor.  Psychiatric: She has a normal mood and affect. Her behavior is normal. Judgment and thought content normal.          Assessment & Plan:   Problem List Items Addressed This Visit      Unprioritized   Hypertension - Primary    BP Readings from Last 3 Encounters:  05/02/16 166/98  04/27/16 190/110  04/11/16 152/104   BP continues to be elevated. Will increase Amlodipine to 5mg  daily.Recheck in 2 weeks.      Relevant Medications   amLODipine (NORVASC) 5 MG tablet   Menopausal disorder    No change in symptoms of anxiety or depression. No side effects noted from Fluoxetine. However, started only 5 days ago. Will continue to monitor for now. Continue Fluoxetine and Wellbutrin. Recheck 2 weeks.       Other Visit Diagnoses    Elevated BP        Relevant Medications    amLODipine (NORVASC) 5 MG tablet        Return in about 2 weeks (around 05/16/2016) for Recheck.  Ronette Deter, MD Internal Medicine [facility]

## 2016-05-02 NOTE — Patient Instructions (Addendum)
Increase Amlodipine to 5 mg daily

## 2016-05-02 NOTE — Assessment & Plan Note (Signed)
No change in symptoms of anxiety or depression. No side effects noted from Fluoxetine. However, started only 5 days ago. Will continue to monitor for now. Continue Fluoxetine and Wellbutrin. Recheck 2 weeks.

## 2016-05-16 ENCOUNTER — Encounter: Payer: Self-pay | Admitting: Internal Medicine

## 2016-05-16 ENCOUNTER — Ambulatory Visit (INDEPENDENT_AMBULATORY_CARE_PROVIDER_SITE_OTHER): Payer: BLUE CROSS/BLUE SHIELD | Admitting: Internal Medicine

## 2016-05-16 VITALS — BP 156/84 | HR 86 | Wt 177.2 lb

## 2016-05-16 DIAGNOSIS — I10 Essential (primary) hypertension: Secondary | ICD-10-CM | POA: Diagnosis not present

## 2016-05-16 DIAGNOSIS — N959 Unspecified menopausal and perimenopausal disorder: Secondary | ICD-10-CM

## 2016-05-16 MED ORDER — ESCITALOPRAM OXALATE 5 MG PO TABS
5.0000 mg | ORAL_TABLET | Freq: Every day | ORAL | 3 refills | Status: DC
Start: 2016-05-16 — End: 2016-07-07

## 2016-05-16 NOTE — Progress Notes (Signed)
Pre visit review using our clinic review tool, if applicable. No additional management support is needed unless otherwise documented below in the visit note. 

## 2016-05-16 NOTE — Patient Instructions (Signed)
Stop Fluoxetine.  Start Lexapro 5mg  daily.  Follow up in 2 weeks.

## 2016-05-16 NOTE — Assessment & Plan Note (Signed)
BP Readings from Last 3 Encounters:  05/16/16 (!) 156/84  05/02/16 (!) 166/98  04/27/16 (!) 190/110   BP improving on Amlodipine. Will continue. Recheck in 2 weeks.

## 2016-05-16 NOTE — Assessment & Plan Note (Signed)
Symptoms of anxiety and depression improved, however now having sexual side effects and tinnitis with Fluoxetine. Will stop Fluoxetine and start Lexapro. Recommended evaluation with psychiatry. She declines. Follow up with new PCP in 2 weeks and by email this week.

## 2016-05-16 NOTE — Progress Notes (Signed)
Subjective:    Patient ID: Jill Atkinson, female    DOB: March 22, 1966, 50 y.o.   MRN: [id number]  HPI  50YO female presents for follow up.  Recently seen for hypertension and menopausal symptoms.  After starting on Fluoxetine, noted occasional "tingling" around lips and ringing in ears. Symptoms of anxiety and depression improved, however she is now also having sexual side effects with no libido.  BP at home has been 140/80-90s mostly last week or so on higher dose of Amlodipine.  Wt Readings from Last 3 Encounters:  05/16/16 177 lb 3.2 oz (80.4 kg)  05/02/16 176 lb 6.4 oz (80 kg)  04/27/16 176 lb 6.4 oz (80 kg)   BP Readings from Last 3 Encounters:  05/16/16 (!) 156/84  05/02/16 (!) 166/98  04/27/16 (!) 190/110    Past Medical History:  Diagnosis Date  . Allergy    hay fever  . Arrhythmia    AVNRT, s/p ablation Dr. Westley Gambles at [facility]  . Asthma    exercise induced  . Chicken pox   . Depression    post partum  . Hypertension   . Shingles   . UTI (lower urinary tract infection)    Family History  Problem Relation Age of Onset  . Arthritis Mother   . Hypertension Mother   . Arthritis Father   . Alcohol abuse Maternal Grandmother   . Drug abuse Maternal Grandmother   . Arthritis Maternal Grandmother   . Stroke Maternal Grandmother   . Hypertension Maternal Grandmother   . Alcohol abuse Maternal Grandfather   . Drug abuse Maternal Grandfather   . Arthritis Maternal Grandfather   . Hypertension Maternal Grandfather    Past Surgical History:  Procedure Laterality Date  . CARDIAC ELECTROPHYSIOLOGY STUDY AND ABLATION  2012   . COLONOSCOPY WITH PROPOFOL N/A 11/02/2015   Procedure: COLONOSCOPY WITH PROPOFOL;  Surgeon: Manya Silvas, MD;  Location: [facility] ENDOSCOPY;  Service: Endoscopy;  Laterality: N/A;  . [place]   post delivery  . NASAL SINUS SURGERY  2006  . TONSILLECTOMY  1978   Social History   Social History  . Marital  status: Married    Spouse name: N/A  . Number of children: N/A  . Years of education: N/A   Social History Main Topics  . Smoking status: Never Smoker  . Smokeless tobacco: None  . Alcohol use 1.2 oz/week    2 Glasses of wine per week  . Drug use: No  . Sexual activity: Not Asked   Other Topics Concern  . None   Social History Narrative   Lives in [place].      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  HENT: Positive for tinnitus.   Eyes: Negative for visual disturbance.  Respiratory: Negative for cough, chest tightness and shortness of breath.   Cardiovascular: Negative for chest pain and leg swelling.  Gastrointestinal: Negative for abdominal pain, constipation, diarrhea, nausea and vomiting.  Skin: Negative for color change and rash.  Neurological: Negative for tremors and weakness.  Hematological: Negative for adenopathy. Does not bruise/bleed easily.  Psychiatric/Behavioral: Positive for dysphoric mood. Negative for behavioral problems, confusion, sleep disturbance and suicidal ideas. The patient is nervous/anxious.        Objective:    BP (!) 156/84 (BP Location:  Left Arm, Patient Position: Sitting, Cuff Size: Large)   Pulse 86   Wt 177 lb 3.2 oz (80.4 kg)   LMP 08/02/2015 Comment: preg test negative  SpO2 98%   BMI 28.60 kg/m  Physical Exam  Constitutional: She is oriented to person, place, and time. She appears well-developed and well-nourished. No distress.  HENT:  Head: Normocephalic and atraumatic.  Right Ear: External ear normal.  Left Ear: External ear normal.  Nose: Nose normal.  Mouth/Throat: Oropharynx is clear and moist. No oropharyngeal exudate.  Eyes: Conjunctivae are normal. Pupils are equal, round, and reactive to light. Right eye exhibits no discharge. Left eye exhibits no discharge. No  scleral icterus.  Neck: Normal range of motion. Neck supple. No tracheal deviation present. No thyromegaly present.  Cardiovascular: Normal rate, regular rhythm, normal heart sounds and intact distal pulses.  Exam reveals no gallop and no friction rub.   No murmur heard. Pulmonary/Chest: Effort normal and breath sounds normal. No respiratory distress. She has no wheezes. She has no rales. She exhibits no tenderness.  Musculoskeletal: Normal range of motion. She exhibits no edema or tenderness.  Lymphadenopathy:    She has no cervical adenopathy.  Neurological: She is alert and oriented to person, place, and time. No cranial nerve deficit. She exhibits normal muscle tone. Coordination normal.  Skin: Skin is warm and dry. No rash noted. She is not diaphoretic. No erythema. No pallor.  Psychiatric: Her speech is normal and behavior is normal. Judgment and thought content normal. Her mood appears anxious. She exhibits a depressed mood. She expresses no suicidal ideation.          Assessment & Plan:   Problem List Items Addressed This Visit      Unprioritized   Hypertension    BP Readings from Last 3 Encounters:  05/16/16 (!) 156/84  05/02/16 (!) 166/98  04/27/16 (!) 190/110   BP improving on Amlodipine. Will continue. Recheck in 2 weeks.      Menopausal disorder - Primary    Symptoms of anxiety and depression improved, however now having sexual side effects and tinnitis with Fluoxetine. Will stop Fluoxetine and start Lexapro. Recommended evaluation with psychiatry. She declines. Follow up with new PCP in 2 weeks and by email this week.       Other Visit Diagnoses   None.      Return in about 2 weeks (around 05/30/2016) for New Patient.  Ronette Deter, MD Internal Medicine [facility]

## 2016-05-18 ENCOUNTER — Ambulatory Visit: Payer: BLUE CROSS/BLUE SHIELD | Admitting: Internal Medicine

## 2016-05-30 ENCOUNTER — Encounter: Payer: Self-pay | Admitting: Family

## 2016-05-30 ENCOUNTER — Ambulatory Visit (INDEPENDENT_AMBULATORY_CARE_PROVIDER_SITE_OTHER): Payer: BLUE CROSS/BLUE SHIELD | Admitting: Family

## 2016-05-30 ENCOUNTER — Ambulatory Visit: Payer: BLUE CROSS/BLUE SHIELD | Admitting: Family

## 2016-05-30 DIAGNOSIS — F329 Major depressive disorder, single episode, unspecified: Secondary | ICD-10-CM

## 2016-05-30 DIAGNOSIS — I10 Essential (primary) hypertension: Secondary | ICD-10-CM | POA: Diagnosis not present

## 2016-05-30 DIAGNOSIS — F32A Depression, unspecified: Secondary | ICD-10-CM

## 2016-05-30 NOTE — Assessment & Plan Note (Signed)
Stable. Lexapro may be working as patient is less tearful. Will f/u 4 weeks or sooner if patient has any concerns.

## 2016-05-30 NOTE — Patient Instructions (Addendum)
Pleasure meeting you.  Major Depressive Disorder Major depressive disorder is a mental illness. It also may be called clinical depression or unipolar depression. Major depressive disorder usually causes feelings of sadness, hopelessness, or helplessness. Some people with this disorder do not feel particularly sad but lose interest in doing things they used to enjoy (anhedonia). Major depressive disorder also can cause physical symptoms. It can interfere with work, school, relationships, and other normal everyday activities. The disorder varies in severity but is longer lasting and more serious than the sadness we all feel from time to time in our lives. Major depressive disorder often is triggered by stressful life events or major life changes. Examples of these triggers include divorce, loss of your job or home, a move, and the death of a family member or close friend. Sometimes this disorder occurs for no obvious reason at all. People who have family members with major depressive disorder or bipolar disorder are at higher risk for developing this disorder, with or without life stressors. Major depressive disorder can occur at any age. It may occur just once in your life (single episode major depressive disorder). It may occur multiple times (recurrent major depressive disorder). SYMPTOMS People with major depressive disorder have either anhedonia or depressed mood on nearly a daily basis for at least 2 weeks or longer. Symptoms of depressed mood include:  Feelings of sadness (blue or down in the dumps) or emptiness.  Feelings of hopelessness or helplessness.  Tearfulness or episodes of crying (may be observed by others).  Irritability (children and adolescents). In addition to depressed mood or anhedonia or both, people with this disorder have at least four of the following symptoms:  Difficulty sleeping or sleeping too much.   Significant change (increase or decrease) in appetite or weight.    Lack of energy or motivation.  Feelings of guilt and worthlessness.   Difficulty concentrating, remembering, or making decisions.  Unusually slow movement (psychomotor retardation) or restlessness (as observed by others).   Recurrent wishes for death, recurrent thoughts of self-harm (suicide), or a suicide attempt. People with major depressive disorder commonly have persistent negative thoughts about themselves, other people, and the world. People with severe major depressive disorder may experiencedistorted beliefs or perceptions about the world (psychotic delusions). They also may see or hear things that are not real (psychotic hallucinations). DIAGNOSIS Major depressive disorder is diagnosed through an assessment by your health care provider. Your health care provider will ask aboutaspects of your daily life, such as mood,sleep, and appetite, to see if you have the diagnostic symptoms of major depressive disorder. Your health care provider may ask about your medical history and use of alcohol or drugs, including prescription medicines. Your health care provider also may do a physical exam and blood work. This is because certain medical conditions and the use of certain substances can cause major depressive disorder-like symptoms (secondary depression). Your health care provider also may refer you to a mental health specialist for further evaluation and treatment. TREATMENT It is important to recognize the symptoms of major depressive disorder and seek treatment. The following treatments can be prescribed for this disorder:   Medicine. Antidepressant medicines usually are prescribed. Antidepressant medicines are thought to correct chemical imbalances in the brain that are commonly associated with major depressive disorder. Other types of medicine may be added if the symptoms do not respond to antidepressant medicines alone or if psychotic delusions or hallucinations occur.  Talk  therapy. Talk therapy can be helpful in treating major  depressive disorder by providing support, education, and guidance. Certain types of talk therapy also can help with negative thinking (cognitive behavioral therapy) and with relationship issues that trigger this disorder (interpersonal therapy). A mental health specialist can help determine which treatment is best for you. Most people with major depressive disorder do well with a combination of medicine and talk therapy. Treatments involving electrical stimulation of the brain can be used in situations with extremely severe symptoms or when medicine and talk therapy do not work over time. These treatments include electroconvulsive therapy, transcranial magnetic stimulation, and vagal nerve stimulation.   This information is not intended to replace advice given to you by your health care provider. Make sure you discuss any questions you have with your health care provider.   Document Released: 01/28/2013 Document Revised: 10/24/2014 Document Reviewed: 01/28/2013 Elsevier Interactive Patient Education [employer].

## 2016-05-30 NOTE — Progress Notes (Signed)
Pre visit review using our clinic review tool, if applicable. No additional management support is needed unless otherwise documented below in the visit note. 

## 2016-05-30 NOTE — Progress Notes (Signed)
Subjective:    Patient ID: Jill Atkinson, female    DOB: [birth year], 50 y.o.   MRN: [id number]  CC: Jill Atkinson is a 50 y.o. female who presents today for follow up.   HPI: Patient here for follow-up on depression and anxiety. She was recently started on Lexapro after discontinuing Prozac due to sexual symptoms. Notes she is not as tearful. She also had a period last week after 8 months of not having one. Describes it as normal, 'slightly lighter' than periods in the past. No other bleeding, epistaxis. Hot flashes improved however she noted this prior to starting lexapro.   She checks BP at home and states that she has had readings as high 170/130 however last few days has averaged 150/90. Denies exertional chest pain or pressure, numbness or tingling radiating to left arm or jaw, palpitations, dizziness, frequent headaches, changes in vision, or shortness of breath.   No family h/o depression.  Family h/o HTN and CVA.     HISTORY:  Past Medical History:  Diagnosis Date  . Allergy    hay fever  . Arrhythmia    AVNRT, s/p ablation Dr. Westley Gambles at [facility]  . Asthma    exercise induced  . Chicken pox   . Depression    post partum  . Hypertension   . Shingles   . UTI (lower urinary tract infection)    Past Surgical History:  Procedure Laterality Date  . CARDIAC ELECTROPHYSIOLOGY STUDY AND ABLATION  2012   . COLONOSCOPY WITH PROPOFOL N/A 11/02/2015   Procedure: COLONOSCOPY WITH PROPOFOL;  Surgeon: Manya Silvas, MD;  Location: [facility] ENDOSCOPY;  Service: Endoscopy;  Laterality: N/A;  . [place]   post delivery  . NASAL SINUS SURGERY  2006  . TONSILLECTOMY  1978   Family History  Problem Relation Age of Onset  . Arthritis Mother   . Hypertension Mother   . Arthritis Father   . Alcohol abuse Maternal Grandmother   . Drug abuse Maternal Grandmother   . Arthritis Maternal Grandmother   . Stroke Maternal Grandmother   . Hypertension  Maternal Grandmother   . Alcohol abuse Maternal Grandfather   . Drug abuse Maternal Grandfather   . Arthritis Maternal Grandfather   . Hypertension Maternal Grandfather     Allergies: Review of patient's allergies indicates no known allergies. Current Outpatient Prescriptions on File Prior to Visit  Medication Sig Dispense Refill  . albuterol (VENTOLIN HFA) 108 (90 Base) MCG/ACT inhaler Inhale 2 puffs into the lungs every 6 (six) hours as needed for wheezing. 18 g 3  . amLODipine (NORVASC) 5 MG tablet Take 1 tablet (5 mg total) by mouth daily. 90 tablet 3  . buPROPion (WELLBUTRIN XL) 300 MG 24 hr tablet Take 1 tablet (300 mg total) by mouth daily. 90 tablet 1  . cetirizine (ZYRTEC) 10 MG tablet Take 10 mg by mouth daily.    Marland Kitchen escitalopram (LEXAPRO) 5 MG tablet Take 1 tablet (5 mg total) by mouth daily. 30 tablet 3  . Fluticasone Propionate (FLONASE NA) Place into the nose.     No current facility-administered medications on file prior to visit.     Social History  Substance Use Topics  . Smoking status: Never Smoker  . Smokeless tobacco: Never Used  . Alcohol use 1.2 oz/week    2 Glasses of wine per week    Review of Systems  Constitutional: Negative for chills and fever.  Eyes: Negative  for visual disturbance.  Respiratory: Negative for cough.   Cardiovascular: Negative for chest pain and palpitations.  Gastrointestinal: Negative for nausea and vomiting.      Objective:    BP (!) 156/96 (BP Location: Left Arm, Patient Position: Sitting, Cuff Size: Normal)   Pulse 81   Temp 97.7 F (36.5 C) (Oral)   Wt 176 lb 3.2 oz (79.9 kg)   LMP 05/26/2016 Comment: preg test negative  SpO2 98%   BMI 28.44 kg/m  BP Readings from Last 3 Encounters:  05/30/16 (!) 156/96  05/16/16 (!) 156/84  05/02/16 (!) 166/98   Wt Readings from Last 3 Encounters:  05/30/16 176 lb 3.2 oz (79.9 kg)  05/16/16 177 lb 3.2 oz (80.4 kg)  05/02/16 176 lb 6.4 oz (80 kg)    Physical Exam    Constitutional: She appears well-developed and well-nourished.  Eyes: Conjunctivae are normal.  Cardiovascular: Normal rate, regular rhythm, normal heart sounds and normal pulses.   Pulmonary/Chest: Effort normal and breath sounds normal. She has no wheezes. She has no rhonchi. She has no rales.  Neurological: She is alert.  Skin: Skin is warm and dry.  Psychiatric: Her speech is normal and behavior is normal. Thought content normal. Her mood appears not anxious. Her affect is not angry and not blunt. She exhibits a depressed mood.  Flat affect.  Vitals reviewed.      Assessment & Plan:   Problem List Items Addressed This Visit      Cardiovascular and Mediastinum   Hypertension    Elevated today. No signs or symptoms of hypertensive urgency or emergency at this time. Patient is currently on one medication, amlodipine, for blood pressure control. She states in the past she had been on an ACE inhibitor which worked well. We discussed that we would use today's measurement as her bench mark and if blood pressure goes any higher than this, despite titrating medication for depression, we would then go ahead and add an ACE inhibitor to her regimen. However, if the blood pressure stays stable in the absence of symptoms, patient prefers to focus on depression and anxiety at this time and discuss/augmment blood pressure management at follow-up.        Other   Depression    Stable. Lexapro may be working as patient is less tearful. Will f/u 4 weeks or sooner if patient has any concerns.       Relevant Medications   FLUoxetine (PROZAC) 20 MG tablet    Other Visit Diagnoses   None.      I am having Jill Atkinson maintain her Fluticasone Propionate (FLONASE NA), cetirizine, buPROPion, albuterol, amLODipine, escitalopram, and FLUoxetine.   Meds ordered this encounter  Medications  . FLUoxetine (PROZAC) 20 MG tablet    Refill:  2    Return precautions given.   Risks, benefits, and  alternatives of the medications and treatment plan prescribed today were discussed, and patient expressed understanding.   Education regarding symptom management and diagnosis given to patient on AVS.  Continue to follow with Mable Paris, FNP for routine health maintenance.   Jill Atkinson and I agreed with plan.   Mable Paris, FNP

## 2016-05-30 NOTE — Assessment & Plan Note (Signed)
Elevated today. No signs or symptoms of hypertensive urgency or emergency at this time. Patient is currently on one medication, amlodipine, for blood pressure control. She states in the past she had been on an ACE inhibitor which worked well. We discussed that we would use today's measurement as her bench mark and if blood pressure goes any higher than this, despite titrating medication for depression, we would then go ahead and add an ACE inhibitor to her regimen. However, if the blood pressure stays stable in the absence of symptoms, patient prefers to focus on depression and anxiety at this time and discuss/augmment blood pressure management at follow-up.

## 2016-07-07 ENCOUNTER — Ambulatory Visit (INDEPENDENT_AMBULATORY_CARE_PROVIDER_SITE_OTHER): Payer: BLUE CROSS/BLUE SHIELD | Admitting: Family

## 2016-07-07 ENCOUNTER — Encounter: Payer: Self-pay | Admitting: Family

## 2016-07-07 VITALS — BP 162/98 | HR 86 | Temp 98.1°F | Ht 66.0 in | Wt 175.0 lb

## 2016-07-07 DIAGNOSIS — I10 Essential (primary) hypertension: Secondary | ICD-10-CM

## 2016-07-07 DIAGNOSIS — F329 Major depressive disorder, single episode, unspecified: Secondary | ICD-10-CM

## 2016-07-07 DIAGNOSIS — F32A Depression, unspecified: Secondary | ICD-10-CM

## 2016-07-07 LAB — COMPREHENSIVE METABOLIC PANEL
ALK PHOS: 111 U/L (ref 39–117)
ALT: 27 U/L (ref 0–35)
AST: 20 U/L (ref 0–37)
Albumin: 4.4 g/dL (ref 3.5–5.2)
BILIRUBIN TOTAL: 0.3 mg/dL (ref 0.2–1.2)
BUN: 8 mg/dL (ref 6–23)
CHLORIDE: 102 meq/L (ref 96–112)
CO2: 29 mEq/L (ref 19–32)
CREATININE: 0.78 mg/dL (ref 0.40–1.20)
Calcium: 9.5 mg/dL (ref 8.4–10.5)
GFR: 82.42 mL/min (ref >60.00)
Glucose, Bld: 90 mg/dL (ref 70–99)
Potassium: 3.8 mEq/L (ref 3.5–5.1)
SODIUM: 139 meq/L (ref 135–145)
Total Protein: 7.7 g/dL (ref 6.0–8.3)

## 2016-07-07 MED ORDER — ESCITALOPRAM OXALATE 10 MG PO TABS: 10.0000 mg | ORAL_TABLET | Freq: Every day | ORAL | 4 refills | Status: DC

## 2016-07-07 MED ORDER — LISINOPRIL 5 MG PO TABS: 5.0000 mg | ORAL_TABLET | Freq: Every day | ORAL | 3 refills | Status: DC

## 2016-07-07 NOTE — Progress Notes (Signed)
Pre visit review using our clinic review tool, if applicable. No additional management support is needed unless otherwise documented below in the visit note. 

## 2016-07-07 NOTE — Progress Notes (Signed)
Subjective:    Patient ID: Jill Atkinson, female    DOB: June 08, 1966, 50 y.o.   MRN: [id number]  CC: Jill Atkinson is a 50 y.o. female who presents today for follow up.   HPI: Here for follow-up on blood pressure and depression.   Depression- Worsening over past 2 weeks. 'Cannot stop crying.' Sleeping well. 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 [place] and Wellbutrin. No anxiety, panic attacks. No thoughts of hurting herself or anyone else.  HTN- Currently on amlodipine. Had been on lisinopril in the past however started exercising and was able to discontinue medication.  BPs at home ~130/95. Denies exertional chest pain or pressure, numbness or tingling radiating to left arm or jaw, palpitations, dizziness, frequent headaches, changes in vision, or shortness of breath.   In past, exercise was helpful however hasn't been exercising due to commute to [place] everyday.     HISTORY:  Past Medical History:  Diagnosis Date  . Allergy    hay fever  . Arrhythmia    AVNRT, s/p ablation Dr. Westley Gambles at [facility]  . Asthma    exercise induced  . Chicken pox   . Depression    post partum  . Hypertension   . Shingles   . UTI (lower urinary tract infection)    Past Surgical History:  Procedure Laterality Date  . CARDIAC ELECTROPHYSIOLOGY STUDY AND ABLATION  2012   . COLONOSCOPY WITH PROPOFOL N/A 11/02/2015   Procedure: COLONOSCOPY WITH PROPOFOL;  Surgeon: Manya Silvas, MD;  Location: [facility] ENDOSCOPY;  Service: Endoscopy;  Laterality: N/A;  . [place]   post delivery  . NASAL SINUS SURGERY  2006  . TONSILLECTOMY  1978   Family History  Problem Relation Atkinson of Onset  . Arthritis Mother   . Hypertension Mother   . Arthritis Father   . Alcohol abuse Maternal Grandmother   . Drug abuse Maternal Grandmother   . Arthritis Maternal Grandmother   . Stroke Maternal Grandmother    . Hypertension Maternal Grandmother   . Alcohol abuse Maternal Grandfather   . Drug abuse Maternal Grandfather   . Arthritis Maternal Grandfather   . Hypertension Maternal Grandfather     Allergies: Review of patient's allergies indicates no known allergies. Current Outpatient Prescriptions on File Prior to Visit  Medication Sig Dispense Refill  . albuterol (VENTOLIN HFA) 108 (90 Base) MCG/ACT inhaler Inhale 2 puffs into the lungs every 6 (six) hours as needed for wheezing. 18 g 3  . amLODipine (NORVASC) 5 MG tablet Take 1 tablet (5 mg total) by mouth daily. 90 tablet 3  . buPROPion (WELLBUTRIN XL) 300 MG 24 hr tablet Take 1 tablet (300 mg total) by mouth daily. 90 tablet 1  . cetirizine (ZYRTEC) 10 MG tablet Take 10 mg by mouth daily.    . Fluticasone Propionate (FLONASE NA) Place into the nose.     No current facility-administered medications on file prior to visit.     Social History  Substance Use Topics  . Smoking status: Never Smoker  . Smokeless tobacco: Never Used  . Alcohol use 1.2 oz/week    2 Glasses of wine per week    Review of Systems  Constitutional: Positive for appetite change. Negative for chills and fever.  Eyes: Negative for visual disturbance.  Respiratory: Negative for cough.   Cardiovascular: Negative for chest pain and palpitations.  Gastrointestinal: Negative  for nausea and vomiting.  Neurological: Negative for headaches.  Psychiatric/Behavioral: Negative for self-injury, sleep disturbance and suicidal ideas. The patient is not nervous/anxious.       Objective:    BP (!) 162/98   Pulse 86   Temp 98.1 F (36.7 C) (Oral)   Ht 5\' 6"  (1.676 m)   Wt 175 lb (79.4 kg)   LMP 05/26/2016 Comment: preg test negative  SpO2 98%   BMI 28.25 kg/m  BP Readings from Last 3 Encounters:  07/07/16 (!) 162/98  05/30/16 (!) 156/96  05/16/16 (!) 156/84   Wt Readings from Last 3 Encounters:  07/07/16 175 lb (79.4 kg)  05/30/16 176 lb 3.2 oz (79.9 kg)    05/16/16 177 lb 3.2 oz (80.4 kg)    Physical Exam  Constitutional: She appears well-developed and well-nourished.  Eyes: Conjunctivae are normal.  Cardiovascular: Normal rate, regular rhythm, normal heart sounds and normal pulses.   Pulmonary/Chest: Effort normal and breath sounds normal. She has no wheezes. She has no rhonchi. She has no rales.  Neurological: She is alert.  Skin: Skin is warm and dry.  Psychiatric: She has a normal mood and affect. Her speech is normal and behavior is normal. Thought content normal.  Vitals reviewed.      Assessment & Plan:   Problem List Items Addressed This Visit      Cardiovascular and Mediastinum   Hypertension    Elevated today. Patient and I jointly decided to start lisinopril at a very low-dose. Pending BMP. No signs or symptoms of hypertensive urgency or emergency. BMP in one month.        Relevant Medications   lisinopril (PRINIVIL,ZESTRIL) 5 MG tablet   Other Relevant Orders   Comprehensive metabolic panel   Comprehensive metabolic panel     Other   Depression - Primary    Acutely worsening due to circumstances. Ms Becknell has no thoughts of hurting herself or anyone else. We jointly decided to increase her Lexapro and place a referral for counseling and CBT. Follow up in one month.       Relevant Medications   escitalopram (LEXAPRO) 10 MG tablet   lisinopril (PRINIVIL,ZESTRIL) 5 MG tablet   Other Relevant Orders   Ambulatory referral to Psychiatry    Other Visit Diagnoses   None.      I have discontinued Ms. Rochford's FLUoxetine. I have also changed her escitalopram. Additionally, I am having her start on lisinopril. Lastly, I am having her maintain her Fluticasone Propionate (FLONASE NA), cetirizine, buPROPion, albuterol, and amLODipine.   Meds ordered this encounter  Medications  . escitalopram (LEXAPRO) 10 MG tablet    Sig: Take 1 tablet (10 mg total) by mouth daily.    Dispense:  30 tablet    Refill:  4    Order  Specific Question:   Supervising Provider    Answer:   Deborra Medina L [[id number]]  . lisinopril (PRINIVIL,ZESTRIL) 5 MG tablet    Sig: Take 1 tablet (5 mg total) by mouth daily.    Dispense:  90 tablet    Refill:  3    Order Specific Question:   Supervising Provider    Answer:   Crecencio Mc [[id number]]    Return precautions given.   Risks, benefits, and alternatives of the medications and treatment plan prescribed today were discussed, and patient expressed understanding.   Education regarding symptom management and diagnosis given to patient on AVS.  Continue to follow with Mable Paris, FNP for  routine health maintenance.   Jill Atkinson and I agreed with plan.   Mable Paris, FNP

## 2016-07-07 NOTE — Assessment & Plan Note (Signed)
Acutely worsening due to circumstances. Jill Atkinson has no thoughts of hurting herself or anyone else. We jointly decided to increase her Lexapro and place a referral for counseling and CBT. Follow up in one month.

## 2016-07-07 NOTE — Assessment & Plan Note (Addendum)
Elevated today. Patient and I jointly decided to start lisinopril at a very low-dose. Pending BMP. No signs or symptoms of hypertensive urgency or emergency. BMP in one month.

## 2016-07-07 NOTE — Patient Instructions (Signed)
Let me know if you ANYTHING. I'm thinking about you.   Please keep tracking your BP - goal < 140/90.  Please come back in one month for lab appt for lab.   If there is no improvement in your symptoms, or if there is any worsening of symptoms, or if you have any additional concerns, please return for re-evaluation; or, if we are closed, consider going to the Emergency Room for evaluation if symptoms urgent.

## 2016-07-11 ENCOUNTER — Other Ambulatory Visit: Payer: Self-pay | Admitting: Family

## 2016-07-11 ENCOUNTER — Encounter: Payer: Self-pay | Admitting: Family

## 2016-07-11 DIAGNOSIS — J029 Acute pharyngitis, unspecified: Secondary | ICD-10-CM

## 2016-07-11 MED ORDER — AMOXICILLIN 500 MG PO CAPS: 500.0000 mg | ORAL_CAPSULE | Freq: Two times a day (BID) | ORAL | 0 refills | Status: DC

## 2016-07-20 ENCOUNTER — Other Ambulatory Visit: Payer: BLUE CROSS/BLUE SHIELD

## 2016-07-21 ENCOUNTER — Other Ambulatory Visit (INDEPENDENT_AMBULATORY_CARE_PROVIDER_SITE_OTHER): Payer: BLUE CROSS/BLUE SHIELD

## 2016-07-21 DIAGNOSIS — I10 Essential (primary) hypertension: Secondary | ICD-10-CM

## 2016-07-21 LAB — COMPREHENSIVE METABOLIC PANEL WITH GFR
ALT: 25 U/L (ref 0–35)
AST: 20 U/L (ref 0–37)
Albumin: 4.5 g/dL (ref 3.5–5.2)
Alkaline Phosphatase: 115 U/L (ref 39–117)
BUN: 10 mg/dL (ref 6–23)
CO2: 29 meq/L (ref 19–32)
Calcium: 9.9 mg/dL (ref 8.4–10.5)
Chloride: 104 meq/L (ref 96–112)
Creatinine, Ser: 0.76 mg/dL (ref 0.40–1.20)
GFR: 84.91 mL/min
Glucose, Bld: 88 mg/dL (ref 70–99)
Potassium: 4.8 meq/L (ref 3.5–5.1)
Sodium: 141 meq/L (ref 135–145)
Total Bilirubin: 0.4 mg/dL (ref 0.2–1.2)
Total Protein: 7.4 g/dL (ref 6.0–8.3)

## 2016-07-22 ENCOUNTER — Other Ambulatory Visit: Payer: Self-pay | Admitting: Family

## 2016-07-22 DIAGNOSIS — I10 Essential (primary) hypertension: Secondary | ICD-10-CM

## 2016-08-02 ENCOUNTER — Encounter: Payer: Self-pay | Admitting: Family

## 2016-08-05 ENCOUNTER — Other Ambulatory Visit: Payer: Self-pay

## 2016-08-05 DIAGNOSIS — F329 Major depressive disorder, single episode, unspecified: Secondary | ICD-10-CM

## 2016-08-05 DIAGNOSIS — F32A Depression, unspecified: Secondary | ICD-10-CM

## 2016-08-05 MED ORDER — ESCITALOPRAM OXALATE 10 MG PO TABS: 10.0000 mg | ORAL_TABLET | Freq: Every day | ORAL | 4 refills | Status: DC

## 2016-08-05 NOTE — Telephone Encounter (Signed)
Medication has been refilled.  Pharmacy sent paper script for medication to be refilled.

## 2016-08-08 ENCOUNTER — Ambulatory Visit (INDEPENDENT_AMBULATORY_CARE_PROVIDER_SITE_OTHER): Payer: BLUE CROSS/BLUE SHIELD | Admitting: Family

## 2016-08-08 ENCOUNTER — Encounter: Payer: Self-pay | Admitting: Family

## 2016-08-08 ENCOUNTER — Other Ambulatory Visit: Payer: Self-pay | Admitting: *Deleted

## 2016-08-08 VITALS — BP 148/86 | HR 89 | Temp 97.9°F | Ht 66.0 in | Wt 172.2 lb

## 2016-08-08 DIAGNOSIS — Z23 Encounter for immunization: Secondary | ICD-10-CM | POA: Diagnosis not present

## 2016-08-08 DIAGNOSIS — F331 Major depressive disorder, recurrent, moderate: Secondary | ICD-10-CM

## 2016-08-08 DIAGNOSIS — R059 Cough, unspecified: Secondary | ICD-10-CM

## 2016-08-08 DIAGNOSIS — I10 Essential (primary) hypertension: Secondary | ICD-10-CM | POA: Diagnosis not present

## 2016-08-08 DIAGNOSIS — R05 Cough: Secondary | ICD-10-CM

## 2016-08-08 DIAGNOSIS — F32A Depression, unspecified: Secondary | ICD-10-CM

## 2016-08-08 DIAGNOSIS — F329 Major depressive disorder, single episode, unspecified: Secondary | ICD-10-CM

## 2016-08-08 LAB — BASIC METABOLIC PANEL
BUN: 11 mg/dL (ref 6–23)
CHLORIDE: 104 meq/L (ref 96–112)
CO2: 29 mEq/L (ref 19–32)
Calcium: 10.1 mg/dL (ref 8.4–10.5)
Creatinine, Ser: 0.81 mg/dL (ref 0.40–1.20)
GFR: 78.88 mL/min (ref >60.00)
Glucose, Bld: 95 mg/dL (ref 70–99)
POTASSIUM: 4.5 meq/L (ref 3.5–5.1)
SODIUM: 141 meq/L (ref 135–145)

## 2016-08-08 MED ORDER — LISINOPRIL 10 MG PO TABS: 10.0000 mg | ORAL_TABLET | Freq: Every day | ORAL | 1 refills | Status: DC

## 2016-08-08 MED ORDER — ESCITALOPRAM OXALATE 10 MG PO TABS: 10.0000 mg | ORAL_TABLET | Freq: Every day | ORAL | 1 refills | Status: DC

## 2016-08-08 MED ORDER — PROMETHAZINE-DM 6.25-15 MG/5ML PO SYRP: 5.0000 mL | ORAL_SOLUTION | Freq: Every evening | ORAL | 1 refills | Status: DC | PRN

## 2016-08-08 NOTE — Assessment & Plan Note (Addendum)
Slight decrease per PHQ which was reassuring however patient reports feeling about the same. Following up with counseling referral. Offered reassurance. Continue Lexapro and Wellbutrin.

## 2016-08-08 NOTE — Assessment & Plan Note (Signed)
Elevated. Increased lisinopril. Pending BMP. Will recheck BMP in 3 months at f/u.

## 2016-08-08 NOTE — Progress Notes (Signed)
Subjective:    Patient ID: Jill Atkinson, female    DOB: 07/06/1966, 50 y.o.   MRN: [id number]  CC: Jill Atkinson is a 50 y.o. female who presents today for follow up.   HPI: Patient here for follow-up on depression and hypertension. She was seen one month ago being increased her Lexapro. We also started her on lisinopril 5mg .  HTN- Average 150s/100; Denies exertional chest pain or pressure, numbness or tingling radiating to left arm or jaw, palpitations, dizziness, frequent headaches, changes in vision, or shortness of breath.    Depression -chain. She is still commuting to [place] and trying to find a job closer to home. She feels like she is sleeping more going to bed 8:30 or 9:00 at night. No thoughts of hurting herself or anyone else.  Cough for past of weeks. On Flonase and Zyrtec. No fever, wheezing. Remote history of asthma with exercise and she still keeps her albuterol inhaler at home. No sinus congestion, nasal discharge, sore throat.      HISTORY:  Past Medical History:  Diagnosis Date  . Allergy    hay fever  . Arrhythmia    AVNRT, s/p ablation Dr. Westley Gambles at [facility]  . Asthma    exercise induced  . Chicken pox   . Depression    post partum  . Hypertension   . Shingles   . UTI (lower urinary tract infection)    Past Surgical History:  Procedure Laterality Date  . CARDIAC ELECTROPHYSIOLOGY STUDY AND ABLATION  2012   . COLONOSCOPY WITH PROPOFOL N/A 11/02/2015   Procedure: COLONOSCOPY WITH PROPOFOL;  Surgeon: Manya Silvas, MD;  Location: [facility] ENDOSCOPY;  Service: Endoscopy;  Laterality: N/A;  . [place]   post delivery  . NASAL SINUS SURGERY  2006  . TONSILLECTOMY  1978   Family History  Problem Relation Age of Onset  . Arthritis Mother   . Hypertension Mother   . Arthritis Father   . Alcohol abuse Maternal Grandmother   . Drug abuse Maternal Grandmother   . Arthritis Maternal Grandmother   . Stroke Maternal  Grandmother   . Hypertension Maternal Grandmother   . Alcohol abuse Maternal Grandfather   . Drug abuse Maternal Grandfather   . Arthritis Maternal Grandfather   . Hypertension Maternal Grandfather     Allergies: Review of patient's allergies indicates no known allergies. Current Outpatient Prescriptions on File Prior to Visit  Medication Sig Dispense Refill  . albuterol (VENTOLIN HFA) 108 (90 Base) MCG/ACT inhaler Inhale 2 puffs into the lungs every 6 (six) hours as needed for wheezing. 18 g 3  . amLODipine (NORVASC) 5 MG tablet Take 1 tablet (5 mg total) by mouth daily. 90 tablet 3  . buPROPion (WELLBUTRIN XL) 300 MG 24 hr tablet Take 1 tablet (300 mg total) by mouth daily. 90 tablet 1  . cetirizine (ZYRTEC) 10 MG tablet Take 10 mg by mouth daily.    Marland Kitchen escitalopram (LEXAPRO) 10 MG tablet Take 1 tablet (10 mg total) by mouth daily. 30 tablet 4  . Fluticasone Propionate (FLONASE NA) Place into the nose.     No current facility-administered medications on file prior to visit.     Social History  Substance Use Topics  . Smoking status: Never Smoker  . Smokeless tobacco: Never Used  . Alcohol use 1.2 oz/week    2 Glasses of wine per week    Review of Systems  Constitutional: Negative for chills  and fever.  HENT: Negative for congestion, ear pain, postnasal drip, sinus pressure and sore throat.   Respiratory: Positive for cough. Negative for shortness of breath and wheezing.   Cardiovascular: Negative for chest pain and palpitations.  Gastrointestinal: Negative for nausea and vomiting.  Psychiatric/Behavioral: Negative for suicidal ideas. The patient is not nervous/anxious.       Objective:    BP (!) 148/86   Pulse 89   Temp 97.9 F (36.6 C) (Oral)   Ht 5\' 6"  (1.676 m)   Wt 172 lb 3.2 oz (78.1 kg)   LMP 05/26/2016 Comment: preg test negative  SpO2 98%   BMI 27.79 kg/m  BP Readings from Last 3 Encounters:  08/08/16 (!) 148/86  07/07/16 (!) 162/98  05/30/16 (!) 156/96     Wt Readings from Last 3 Encounters:  08/08/16 172 lb 3.2 oz (78.1 kg)  07/07/16 175 lb (79.4 kg)  05/30/16 176 lb 3.2 oz (79.9 kg)    Physical Exam  Constitutional: She appears well-developed and well-nourished.  HENT:  Nose: No rhinorrhea. Right sinus exhibits no maxillary sinus tenderness and no frontal sinus tenderness. Left sinus exhibits no maxillary sinus tenderness and no frontal sinus tenderness.  Mouth/Throat: No posterior oropharyngeal edema or posterior oropharyngeal erythema.  Eyes: Conjunctivae are normal.  Cardiovascular: Normal rate, regular rhythm, normal heart sounds and normal pulses.   Pulmonary/Chest: Effort normal and breath sounds normal. She has no wheezes. She has no rhonchi. She has no rales.  Neurological: She is alert.  Skin: Skin is warm and dry.  Psychiatric: She has a normal mood and affect. Her speech is normal and behavior is normal. Thought content normal.  Vitals reviewed.      Assessment & Plan:   Problem List Items Addressed This Visit      Cardiovascular and Mediastinum   Hypertension    Elevated. Increased lisinopril. Pending BMP. Will recheck BMP in 3 months at f/u.      Relevant Medications   lisinopril (PRINIVIL,ZESTRIL) 10 MG tablet   Other Relevant Orders   Basic metabolic panel     Other   Depression - Primary    Slight decrease per PHQ which was reassuring however patient reports feeling about the same. Following up with counseling referral. Offered reassurance. Continue Lexapro and Wellbutrin.      Cough    Symptoms consistent with viral bronchitis. Patient and I agreed on delayed antibiotics. Cough syrup given. Return precautions given.      Relevant Medications   promethazine-dextromethorphan (PROMETHAZINE-DM) 6.25-15 MG/5ML syrup    Other Visit Diagnoses   None.      I have discontinued Ms. Plumb's amoxicillin and FLUoxetine. I have also changed her lisinopril. Additionally, I am having her start on  promethazine-dextromethorphan. Lastly, I am having her maintain her Fluticasone Propionate (FLONASE NA), cetirizine, buPROPion, albuterol, amLODipine, and escitalopram.   Meds ordered this encounter  Medications  . lisinopril (PRINIVIL,ZESTRIL) 10 MG tablet    Sig: Take 1 tablet (10 mg total) by mouth daily.    Dispense:  90 tablet    Refill:  1    Order Specific Question:   Supervising Provider    Answer:   Deborra Medina L [[id number]]  . promethazine-dextromethorphan (PROMETHAZINE-DM) 6.25-15 MG/5ML syrup    Sig: Take 5 mLs by mouth at bedtime as needed for cough.    Dispense:  40 mL    Refill:  1    Order Specific Question:   Supervising Provider    Answer:  TULLO, TERESA L [[id number]]  . DISCONTD: FLUoxetine (PROZAC) 20 MG tablet    Refill:  2    Return precautions given.   Risks, benefits, and alternatives of the medications and treatment plan prescribed today were discussed, and patient expressed understanding.   Education regarding symptom management and diagnosis given to patient on AVS.  Continue to follow with Mable Paris, FNP for routine health maintenance.   Jill Atkinson and I agreed with plan.   Mable Paris, FNP

## 2016-08-08 NOTE — Progress Notes (Signed)
I can not see the behavorial health referrals once they have been submitted to them. I sent patient a message with their phone number to please give them a call to schedule an appointment.

## 2016-08-08 NOTE — Progress Notes (Signed)
Pre visit review using our clinic review tool, if applicable. No additional management support is needed unless otherwise documented below in the visit note. 

## 2016-08-08 NOTE — Patient Instructions (Signed)
Let me know if cough is does not improve.   Please let me know if you need anything.   Labs on your way out.   Joycelyn Schmid

## 2016-08-08 NOTE — Assessment & Plan Note (Signed)
Symptoms consistent with viral bronchitis. Patient and I agreed on delayed antibiotics. Cough syrup given. Return precautions given.

## 2016-08-18 ENCOUNTER — Ambulatory Visit (INDEPENDENT_AMBULATORY_CARE_PROVIDER_SITE_OTHER): Payer: BLUE CROSS/BLUE SHIELD | Admitting: Psychology

## 2016-08-18 DIAGNOSIS — F331 Major depressive disorder, recurrent, moderate: Secondary | ICD-10-CM

## 2016-09-01 ENCOUNTER — Ambulatory Visit: Payer: BLUE CROSS/BLUE SHIELD | Admitting: Psychology

## 2016-09-01 ENCOUNTER — Ambulatory Visit (INDEPENDENT_AMBULATORY_CARE_PROVIDER_SITE_OTHER): Payer: BLUE CROSS/BLUE SHIELD | Admitting: Psychology

## 2016-09-01 DIAGNOSIS — F331 Major depressive disorder, recurrent, moderate: Secondary | ICD-10-CM | POA: Diagnosis not present

## 2016-09-05 ENCOUNTER — Encounter: Payer: Self-pay | Admitting: Family

## 2016-09-27 ENCOUNTER — Ambulatory Visit (INDEPENDENT_AMBULATORY_CARE_PROVIDER_SITE_OTHER): Payer: BLUE CROSS/BLUE SHIELD | Admitting: Psychology

## 2016-09-27 DIAGNOSIS — F331 Major depressive disorder, recurrent, moderate: Secondary | ICD-10-CM

## 2016-10-05 ENCOUNTER — Telehealth: Payer: Self-pay | Admitting: Family

## 2016-10-05 NOTE — Telephone Encounter (Signed)
Pharmacy called requesting a refill on buPROPion (WELLBUTRIN XL) 300 MG 24 hr tablet, Please advise, thank you!  Pharmacy - [id number] - [place], [address]

## 2016-10-06 MED ORDER — BUPROPION HCL ER (XL) 300 MG PO TB24: 300.0000 mg | ORAL_TABLET | Freq: Every day | ORAL | 2 refills | Status: DC

## 2016-10-06 NOTE — Telephone Encounter (Signed)
Medication has been refilled.

## 2016-10-13 ENCOUNTER — Ambulatory Visit: Payer: BLUE CROSS/BLUE SHIELD | Admitting: Psychology

## 2016-10-26 ENCOUNTER — Ambulatory Visit (INDEPENDENT_AMBULATORY_CARE_PROVIDER_SITE_OTHER): Payer: 59 | Admitting: Family

## 2016-10-26 ENCOUNTER — Encounter: Payer: Self-pay | Admitting: Family

## 2016-10-26 VITALS — BP 154/94 | HR 88 | Temp 98.1°F | Ht 66.0 in | Wt 176.2 lb

## 2016-10-26 DIAGNOSIS — F331 Major depressive disorder, recurrent, moderate: Secondary | ICD-10-CM | POA: Diagnosis not present

## 2016-10-26 DIAGNOSIS — R0981 Nasal congestion: Secondary | ICD-10-CM

## 2016-10-26 DIAGNOSIS — I10 Essential (primary) hypertension: Secondary | ICD-10-CM

## 2016-10-26 MED ORDER — AMLODIPINE BESYLATE 10 MG PO TABS: 10.0000 mg | ORAL_TABLET | Freq: Every day | ORAL | 1 refills | Status: DC

## 2016-10-26 NOTE — Assessment & Plan Note (Signed)
Pleased to see improvement. I suspect a new job ( start 11/07/16)  is helping patient. We jointly agreed to stay the course with current regimen. Follow-up in 3 months.

## 2016-10-26 NOTE — Progress Notes (Signed)
Subjective:    Patient ID: Jill Atkinson, female    DOB: 01-04-1966, 51 y.o.   MRN: [id number]  CC: Jill Atkinson is a 51 y.o. female who presents today for follow up.   HPI: Depression and anxiety- improved. Average 130/95. Denies exertional chest pain or pressure, numbness or tingling radiating to left arm or jaw, palpitations, dizziness, frequent headaches, changes in vision, or shortness of breath.    11/07/16 starts new job.    Productive cough and congestion started one week, No wheezing, SOB. Endorses sore throat. Tried vaporizer with some relief.        HISTORY:  Past Medical History:  Diagnosis Date  . Allergy    hay fever  . Arrhythmia    AVNRT, s/p ablation Dr. Westley Gambles at [facility]  . Asthma    exercise induced  . Chicken pox   . Depression    post partum  . Hypertension   . Shingles   . UTI (lower urinary tract infection)    Past Surgical History:  Procedure Laterality Date  . CARDIAC ELECTROPHYSIOLOGY STUDY AND ABLATION  2012   . COLONOSCOPY WITH PROPOFOL N/A 11/02/2015   Procedure: COLONOSCOPY WITH PROPOFOL;  Surgeon: Manya Silvas, MD;  Location: [facility] ENDOSCOPY;  Service: Endoscopy;  Laterality: N/A;  . [place]   post delivery  . NASAL SINUS SURGERY  2006  . TONSILLECTOMY  1978   Family History  Problem Relation Age of Onset  . Arthritis Mother   . Hypertension Mother   . Arthritis Father   . Alcohol abuse Maternal Grandmother   . Drug abuse Maternal Grandmother   . Arthritis Maternal Grandmother   . Stroke Maternal Grandmother   . Hypertension Maternal Grandmother   . Alcohol abuse Maternal Grandfather   . Drug abuse Maternal Grandfather   . Arthritis Maternal Grandfather   . Hypertension Maternal Grandfather     Allergies: Patient has no known allergies. Current Outpatient Prescriptions on File Prior to Visit  Medication Sig Dispense Refill  . albuterol (VENTOLIN HFA) 108 (90 Base) MCG/ACT inhaler  Inhale 2 puffs into the lungs every 6 (six) hours as needed for wheezing. 18 g 3  . buPROPion (WELLBUTRIN XL) 300 MG 24 hr tablet Take 1 tablet (300 mg total) by mouth daily. 90 tablet 2  . cetirizine (ZYRTEC) 10 MG tablet Take 10 mg by mouth daily.    Marland Kitchen escitalopram (LEXAPRO) 10 MG tablet Take 1 tablet (10 mg total) by mouth daily. 90 tablet 1  . Fluticasone Propionate (FLONASE NA) Place into the nose.    Marland Kitchen lisinopril (PRINIVIL,ZESTRIL) 10 MG tablet Take 1 tablet (10 mg total) by mouth daily. 90 tablet 1  . promethazine-dextromethorphan (PROMETHAZINE-DM) 6.25-15 MG/5ML syrup Take 5 mLs by mouth at bedtime as needed for cough. 40 mL 1   No current facility-administered medications on file prior to visit.     Social History  Substance Use Topics  . Smoking status: Never Smoker  . Smokeless tobacco: Never Used  . Alcohol use 1.2 oz/week    2 Glasses of wine per week    Review of Systems  Constitutional: Negative for chills and fever.  HENT: Positive for congestion, ear pain and sinus pressure. Negative for sinus pain and sore throat.   Respiratory: Positive for cough. Negative for shortness of breath and wheezing.   Cardiovascular: Negative for chest pain and palpitations.  Gastrointestinal: Negative for nausea and vomiting.  Psychiatric/Behavioral: The patient is  not nervous/anxious.       Objective:    BP (!) 154/94   Pulse 88   Temp 98.1 F (36.7 C) (Oral)   Ht 5\' 6"  (1.676 m)   Wt 176 lb 3.2 oz (79.9 kg)   LMP 05/26/2016 Comment: preg test negative  SpO2 97%   BMI 28.44 kg/m  BP Readings from Last 3 Encounters:  10/26/16 (!) 154/94  08/08/16 (!) 148/86  07/07/16 (!) 162/98   Wt Readings from Last 3 Encounters:  10/26/16 176 lb 3.2 oz (79.9 kg)  08/08/16 172 lb 3.2 oz (78.1 kg)  07/07/16 175 lb (79.4 kg)    Physical Exam  Constitutional: She appears well-developed and well-nourished.  HENT:  Head: Normocephalic and atraumatic.  Right Ear: Hearing, tympanic  membrane, external ear and ear canal normal. No drainage, swelling or tenderness. No foreign bodies. Tympanic membrane is not erythematous and not bulging. No middle ear effusion. No decreased hearing is noted.  Left Ear: Hearing, tympanic membrane, external ear and ear canal normal. No drainage, swelling or tenderness. No foreign bodies. Tympanic membrane is not erythematous and not bulging.  No middle ear effusion. No decreased hearing is noted.  Nose: Nose normal. No rhinorrhea. Right sinus exhibits no maxillary sinus tenderness and no frontal sinus tenderness. Left sinus exhibits no maxillary sinus tenderness and no frontal sinus tenderness.  Mouth/Throat: Uvula is midline, oropharynx is clear and moist and mucous membranes are normal. No oropharyngeal exudate, posterior oropharyngeal edema, posterior oropharyngeal erythema or tonsillar abscesses.  Eyes: Conjunctivae are normal.  Cardiovascular: Normal rate, regular rhythm, normal heart sounds and normal pulses.   Pulmonary/Chest: Effort normal and breath sounds normal. She has no wheezes. She has no rhonchi. She has no rales.  Lymphadenopathy:       Head (right side): No submental, no submandibular, no tonsillar, no preauricular, no posterior auricular and no occipital adenopathy present.       Head (left side): No submental, no submandibular, no tonsillar, no preauricular, no posterior auricular and no occipital adenopathy present.    She has no cervical adenopathy.  Neurological: She is alert.  Skin: Skin is warm and dry.  Psychiatric: She has a normal mood and affect. Her speech is normal and behavior is normal. Thought content normal.  Vitals reviewed.      Assessment & Plan:   Problem List Items Addressed This Visit      Cardiovascular and Mediastinum   Hypertension - Primary    Elevated today which also reflect values at home. Increased amlodipine. F/u 3 months.       Relevant Medications   amLODipine (NORVASC) 10 MG tablet       Respiratory   Sinus congestion    Afebrile. Suspect viral etiology. Patient and I jointly agreed conservative therapy including Mucinex. If no better, patient understands to give me call and let me know.        Other   Depression    Pleased to see improvement. I suspect a new job ( start 11/07/16)  is helping patient. We jointly agreed to stay the course with current regimen. Follow-up in 3 months.          I have changed Ms. Maalouf's amLODipine. I am also having her maintain her Fluticasone Propionate (FLONASE NA), cetirizine, albuterol, lisinopril, promethazine-dextromethorphan, escitalopram, and buPROPion.   Meds ordered this encounter  Medications  . amLODipine (NORVASC) 10 MG tablet    Sig: Take 1 tablet (10 mg total) by mouth daily.  Dispense:  90 tablet    Refill:  1    Order Specific Question:   Supervising Provider    Answer:   Crecencio Mc [[id number]]    Return precautions given.   Risks, benefits, and alternatives of the medications and treatment plan prescribed today were discussed, and patient expressed understanding.   Education regarding symptom management and diagnosis given to patient on AVS.  Continue to follow with Mable Paris, FNP for routine health maintenance.   Jill Atkinson and I agreed with plan.   Mable Paris, FNP

## 2016-10-26 NOTE — Assessment & Plan Note (Signed)
Afebrile. Suspect viral etiology. Patient and I jointly agreed conservative therapy including Mucinex. If no better, patient understands to give me call and let me know.

## 2016-10-26 NOTE — Assessment & Plan Note (Signed)
Elevated today which also reflect values at home. Increased amlodipine. F/u 3 months.

## 2016-10-26 NOTE — Patient Instructions (Signed)
Increased amlodipine from 5 mg to 10 mg a day. Please let me know if blood pressure runs persistently over 140/90. Please also let me know if after trial of Mucinex ,congestion is not improved.  Congratulations on the job!

## 2016-10-26 NOTE — Progress Notes (Signed)
Pre visit review using our clinic review tool, if applicable. No additional management support is needed unless otherwise documented below in the visit note. 

## 2016-11-01 ENCOUNTER — Encounter: Payer: Self-pay | Admitting: Family

## 2016-11-01 ENCOUNTER — Other Ambulatory Visit: Payer: Self-pay | Admitting: Family

## 2016-11-01 DIAGNOSIS — J0120 Acute ethmoidal sinusitis, unspecified: Secondary | ICD-10-CM

## 2016-11-01 MED ORDER — AMOXICILLIN 500 MG PO CAPS: 500.0000 mg | ORAL_CAPSULE | Freq: Two times a day (BID) | ORAL | 0 refills | Status: DC

## 2017-01-30 ENCOUNTER — Ambulatory Visit (INDEPENDENT_AMBULATORY_CARE_PROVIDER_SITE_OTHER): Payer: BLUE CROSS/BLUE SHIELD | Admitting: Family

## 2017-01-30 ENCOUNTER — Encounter: Payer: Self-pay | Admitting: Family

## 2017-01-30 ENCOUNTER — Other Ambulatory Visit: Payer: Self-pay | Admitting: Family

## 2017-01-30 VITALS — BP 120/68 | HR 86 | Temp 97.9°F | Ht 66.0 in | Wt 179.8 lb

## 2017-01-30 DIAGNOSIS — I10 Essential (primary) hypertension: Secondary | ICD-10-CM

## 2017-01-30 DIAGNOSIS — F331 Major depressive disorder, recurrent, moderate: Secondary | ICD-10-CM | POA: Diagnosis not present

## 2017-01-30 MED ORDER — AMLODIPINE BESYLATE 5 MG PO TABS: 5.0000 mg | ORAL_TABLET | Freq: Every day | ORAL | 0 refills | Status: DC

## 2017-01-30 MED ORDER — LISINOPRIL 10 MG PO TABS: 20.0000 mg | ORAL_TABLET | Freq: Every day | ORAL | 1 refills | Status: DC

## 2017-01-30 NOTE — Assessment & Plan Note (Signed)
Doing well. Will continue to current regimen.

## 2017-01-30 NOTE — Patient Instructions (Signed)
Lab recheck on Friday   Hold amlodipine  Increase lisinopril   If blood pressure, greater than 135/85, start back on 5mg  amlodipine.   Early congrats for your daughter!

## 2017-01-30 NOTE — Progress Notes (Signed)
Subjective:    Patient ID: Jill Atkinson, female    DOB: 17-Sep-1966, 51 y.o.   MRN: [id number]  CC: Jill Atkinson is a 51 y.o. female who presents today for an acute visit.    HPI: CC: le swelling for couple weeks, unchanged. Worse at end of day, improves after sleep. No SOB, h/o DVT, orthopnea.  On increased norvasc. Checks BP at home.  Denies exertional chest pain or pressure, numbness or tingling radiating to left arm or jaw, palpitations, dizziness, frequent headaches, changes in vision, or shortness of breath.          HISTORY:  Past Medical History:  Diagnosis Date  . Allergy    hay fever  . Arrhythmia    AVNRT, s/p ablation Dr. Westley Atkinson at [facility]  . Asthma    exercise induced  . Chicken pox   . Depression    post partum  . Hypertension   . Shingles   . UTI (lower urinary tract infection)    Past Surgical History:  Procedure Laterality Date  . CARDIAC ELECTROPHYSIOLOGY STUDY AND ABLATION  2012   . COLONOSCOPY WITH PROPOFOL N/A 11/02/2015   Procedure: COLONOSCOPY WITH PROPOFOL;  Surgeon: Jill Silvas, MD;  Location: [facility] ENDOSCOPY;  Service: Endoscopy;  Laterality: N/A;  . [place]   post delivery  . NASAL SINUS SURGERY  2006  . TONSILLECTOMY  1978   Family History  Problem Relation Age of Onset  . Arthritis Mother   . Hypertension Mother   . Arthritis Father   . Alcohol abuse Maternal Grandmother   . Drug abuse Maternal Grandmother   . Arthritis Maternal Grandmother   . Stroke Maternal Grandmother   . Hypertension Maternal Grandmother   . Alcohol abuse Maternal Grandfather   . Drug abuse Maternal Grandfather   . Arthritis Maternal Grandfather   . Hypertension Maternal Grandfather     Allergies: Patient has no known allergies. Current Outpatient Prescriptions on File Prior to Visit  Medication Sig Dispense Refill  . albuterol (VENTOLIN HFA) 108 (90 Base) MCG/ACT inhaler Inhale 2 puffs into the lungs every 6  (six) hours as needed for wheezing. 18 g 3  . amoxicillin (AMOXIL) 500 MG capsule Take 1 capsule (500 mg total) by mouth 2 (two) times daily. 14 capsule 0  . buPROPion (WELLBUTRIN XL) 300 MG 24 hr tablet Take 1 tablet (300 mg total) by mouth daily. 90 tablet 2  . cetirizine (ZYRTEC) 10 MG tablet Take 10 mg by mouth daily.    . Fluticasone Propionate (FLONASE NA) Place into the nose.    . promethazine-dextromethorphan (PROMETHAZINE-DM) 6.25-15 MG/5ML syrup Take 5 mLs by mouth at bedtime as needed for cough. 40 mL 1  . escitalopram (LEXAPRO) 10 MG tablet Take 1 tablet (10 mg total) by mouth daily. 90 tablet 1   No current facility-administered medications on file prior to visit.     Social History  Substance Use Topics  . Smoking status: Never Smoker  . Smokeless tobacco: Never Used  . Alcohol use 1.2 oz/week    2 Glasses of wine per week    Review of Systems  Constitutional: Negative for chills and fever.  Respiratory: Negative for cough, shortness of breath and wheezing.   Cardiovascular: Positive for leg swelling. Negative for chest pain and palpitations.  Gastrointestinal: Negative for nausea and vomiting.      Objective:    BP 120/68   Pulse 86   Temp 97.9  F (36.6 C) (Oral)   Ht 5\' 6"  (1.676 m)   Wt 179 lb 12.8 oz (81.6 kg)   LMP 05/26/2016 Comment: preg test negative  SpO2 97%   BMI 29.02 kg/m  Wt Readings from Last 3 Encounters:  01/30/17 179 lb 12.8 oz (81.6 kg)  10/26/16 176 lb 3.2 oz (79.9 kg)  08/08/16 172 lb 3.2 oz (78.1 kg)     Physical Exam  Constitutional: She appears well-developed and well-nourished.  Eyes: Conjunctivae are normal.  Cardiovascular: Normal rate, regular rhythm, normal heart sounds and normal pulses.   No LE edema, palpable cords or masses. No erythema or increased warmth. No asymmetry in calf size when compared bilaterally LE hair growth symmetric and present. No discoloration of varicosities noted. LE warm and palpable pedal  pulses.   Pulmonary/Chest: Effort normal and breath sounds normal. She has no wheezes. She has no rhonchi. She has no rales.  Neurological: She is alert.  Skin: Skin is warm and dry.  Psychiatric: She has a normal mood and affect. Her speech is normal and behavior is normal. Thought content normal.  Vitals reviewed.      Assessment & Plan:   Problem List Items Addressed This Visit      Cardiovascular and Mediastinum   Hypertension - Primary    Suspect LE swelling dose dependent amlodipine. Hold for now. Increase lisinopril. Recheck BMP. Will start back on 5mg  amlodipine if BP > 135/85      Relevant Medications   amLODipine (NORVASC) 5 MG tablet   lisinopril (PRINIVIL,ZESTRIL) 10 MG tablet   Other Relevant Orders   Basic metabolic panel     Other   Depression    Doing well. Will continue to current regimen.           I have changed Ms. Jill Atkinson's amLODipine and lisinopril. I am also having her maintain her Fluticasone Propionate (FLONASE NA), cetirizine, albuterol, promethazine-dextromethorphan, escitalopram, buPROPion, and amoxicillin.   Meds ordered this encounter  Medications  . amLODipine (NORVASC) 5 MG tablet    Sig: Take 1 tablet (5 mg total) by mouth daily.    Dispense:  90 tablet    Refill:  0    Order Specific Question:   Supervising Provider    Answer:   Jill Atkinson [[id number]]  . lisinopril (PRINIVIL,ZESTRIL) 10 MG tablet    Sig: Take 2 tablets (20 mg total) by mouth daily.    Dispense:  90 tablet    Refill:  1    Order Specific Question:   Supervising Provider    Answer:   Jill Atkinson [[id number]]    Return precautions given.   Risks, benefits, and alternatives of the medications and treatment plan prescribed today were discussed, and patient expressed understanding.   Education regarding symptom management and diagnosis given to patient on AVS.  Continue to follow with Jill Paris, FNP for routine health maintenance.   Jill Atkinson and I  agreed with plan.   Jill Paris, FNP

## 2017-01-30 NOTE — Assessment & Plan Note (Signed)
Suspect LE swelling dose dependent amlodipine. Hold for now. Increase lisinopril. Recheck BMP. Will start back on 5mg  amlodipine if BP > 135/85

## 2017-01-30 NOTE — Progress Notes (Signed)
Pre visit review using our clinic review tool, if applicable. No additional management support is needed unless otherwise documented below in the visit note. 

## 2017-02-03 ENCOUNTER — Other Ambulatory Visit (INDEPENDENT_AMBULATORY_CARE_PROVIDER_SITE_OTHER): Payer: BLUE CROSS/BLUE SHIELD

## 2017-02-03 DIAGNOSIS — I10 Essential (primary) hypertension: Secondary | ICD-10-CM

## 2017-02-03 LAB — BASIC METABOLIC PANEL
BUN: 10 mg/dL (ref 6–23)
CO2: 30 meq/L (ref 19–32)
Calcium: 9.5 mg/dL (ref 8.4–10.5)
Chloride: 104 mEq/L (ref 96–112)
Creatinine, Ser: 0.75 mg/dL (ref 0.40–1.20)
GFR: 86.04 mL/min (ref >60.00)
GLUCOSE: 94 mg/dL (ref 70–99)
POTASSIUM: 4.4 meq/L (ref 3.5–5.1)
Sodium: 139 mEq/L (ref 135–145)

## 2017-02-05 DIAGNOSIS — J069 Acute upper respiratory infection, unspecified: Secondary | ICD-10-CM | POA: Diagnosis not present

## 2017-02-05 DIAGNOSIS — J019 Acute sinusitis, unspecified: Secondary | ICD-10-CM | POA: Diagnosis not present

## 2017-03-20 ENCOUNTER — Other Ambulatory Visit: Payer: Self-pay | Admitting: Family

## 2017-03-20 DIAGNOSIS — F32A Depression, unspecified: Secondary | ICD-10-CM

## 2017-03-20 DIAGNOSIS — F329 Major depressive disorder, single episode, unspecified: Secondary | ICD-10-CM

## 2017-03-22 ENCOUNTER — Telehealth: Payer: Self-pay | Admitting: *Deleted

## 2017-03-22 DIAGNOSIS — F32A Depression, unspecified: Secondary | ICD-10-CM

## 2017-03-22 DIAGNOSIS — F329 Major depressive disorder, single episode, unspecified: Secondary | ICD-10-CM

## 2017-03-22 MED ORDER — ESCITALOPRAM OXALATE 10 MG PO TABS: 10.0000 mg | ORAL_TABLET | Freq: Every day | ORAL | 1 refills | Status: DC

## 2017-03-22 NOTE — Telephone Encounter (Signed)
Medication Refill requested for :escitalopram  Pharmacy:[facility]  Return Contact :

## 2017-03-22 NOTE — Telephone Encounter (Signed)
Medication has been refilled.

## 2017-04-02 DIAGNOSIS — L03115 Cellulitis of right lower limb: Secondary | ICD-10-CM | POA: Diagnosis not present

## 2017-04-05 ENCOUNTER — Ambulatory Visit (INDEPENDENT_AMBULATORY_CARE_PROVIDER_SITE_OTHER): Payer: BLUE CROSS/BLUE SHIELD | Admitting: Family

## 2017-04-05 ENCOUNTER — Encounter: Payer: Self-pay | Admitting: Family

## 2017-04-05 ENCOUNTER — Ambulatory Visit (INDEPENDENT_AMBULATORY_CARE_PROVIDER_SITE_OTHER): Payer: BLUE CROSS/BLUE SHIELD

## 2017-04-05 VITALS — BP 130/80 | HR 68 | Temp 98.3°F | Ht 66.0 in | Wt 178.0 lb

## 2017-04-05 DIAGNOSIS — S8991XA Unspecified injury of right lower leg, initial encounter: Secondary | ICD-10-CM | POA: Diagnosis not present

## 2017-04-05 DIAGNOSIS — L03115 Cellulitis of right lower limb: Secondary | ICD-10-CM | POA: Diagnosis not present

## 2017-04-05 MED ORDER — SULFAMETHOXAZOLE-TRIMETHOPRIM 800-160 MG PO TABS: 1.0000 | ORAL_TABLET | Freq: Two times a day (BID) | ORAL | 0 refills | Status: DC

## 2017-04-05 NOTE — Assessment & Plan Note (Signed)
Afebrile and patient is well-appearing. Failed Keflex. We'll start Bactrim to cover for beta hemolytic hemolytic strep and MRSA. No drainage to collect wound culture. Advised patient if any drainage occurs, she may use wound culture at home and bring it back to [place]. Based on duration of infection, pending blood cultures, x-ray to ensure infection has remained localized. Advised very close vigilance and patient will let me know how she's doing in the next 24 hours to see if the Bactrim is resolving infection.

## 2017-04-05 NOTE — Patient Instructions (Addendum)
Labs, Xray  Stop keflex  Start bactrim  Ensure to take probiotics while on antibiotics and also for 2 weeks after completion. It is important to re-colonize the gut with good bacteria and also to prevent any diarrheal infections associated with antibiotic use.   Remain highly vigilant for signs of worsening including systemic features - nausea, vomiting, not feeling well, fever - as you would need to go to emergency room for further evaluation / IV antibiotics  Wound culture if you are able to collect  Let me know how you are doing

## 2017-04-05 NOTE — Progress Notes (Signed)
Subjective:    Patient ID: Jill Atkinson, female    DOB: [birth year], 51 y.o.   MRN: [id number]  CC: Jill Atkinson is a 51 y.o. female who presents today for an acute visit.    HPI: CC: rash to right leg, unchanged, started 8 days ago. Then 6 days ago, 'started to spread.'  Started out as 'in grown hair.'  Itching. No pain.   Hasn't been hiking, outdoors; no ticks.  Went to urgent care 3 days ago, started on keflex and feels like 'halting the spread' however not better.   No mrsa. No skin infection history.         HISTORY:  Past Medical History:  Diagnosis Date  . Allergy    hay fever  . Arrhythmia    AVNRT, s/p ablation Dr. Westley Gambles at [facility]  . Asthma    exercise induced  . Chicken pox   . Depression    post partum  . Hypertension   . Shingles   . UTI (lower urinary tract infection)    Past Surgical History:  Procedure Laterality Date  . CARDIAC ELECTROPHYSIOLOGY STUDY AND ABLATION  2012   . COLONOSCOPY WITH PROPOFOL N/A 11/02/2015   Procedure: COLONOSCOPY WITH PROPOFOL;  Surgeon: Manya Silvas, MD;  Location: [facility] ENDOSCOPY;  Service: Endoscopy;  Laterality: N/A;  . [place]   post delivery  . NASAL SINUS SURGERY  2006  . TONSILLECTOMY  1978   Family History  Problem Relation Age of Onset  . Arthritis Mother   . Hypertension Mother   . Arthritis Father   . Alcohol abuse Maternal Grandmother   . Drug abuse Maternal Grandmother   . Arthritis Maternal Grandmother   . Stroke Maternal Grandmother   . Hypertension Maternal Grandmother   . Alcohol abuse Maternal Grandfather   . Drug abuse Maternal Grandfather   . Arthritis Maternal Grandfather   . Hypertension Maternal Grandfather     Allergies: Patient has no known allergies. Current Outpatient Prescriptions on File Prior to Visit  Medication Sig Dispense Refill  . albuterol (VENTOLIN HFA) 108 (90 Base) MCG/ACT inhaler Inhale 2 puffs into the lungs every 6 (six)  hours as needed for wheezing. 18 g 3  . amLODipine (NORVASC) 5 MG tablet Take 1 tablet (5 mg total) by mouth daily. 90 tablet 0  . amoxicillin (AMOXIL) 500 MG capsule Take 1 capsule (500 mg total) by mouth 2 (two) times daily. 14 capsule 0  . buPROPion (WELLBUTRIN XL) 300 MG 24 hr tablet Take 1 tablet (300 mg total) by mouth daily. 90 tablet 2  . cetirizine (ZYRTEC) 10 MG tablet Take 10 mg by mouth daily.    Marland Kitchen escitalopram (LEXAPRO) 10 MG tablet Take 1 tablet (10 mg total) by mouth daily. 90 tablet 1  . Fluticasone Propionate (FLONASE NA) Place into the nose.    Marland Kitchen lisinopril (PRINIVIL,ZESTRIL) 10 MG tablet Take 2 tablets (20 mg total) by mouth daily. 90 tablet 1  . promethazine-dextromethorphan (PROMETHAZINE-DM) 6.25-15 MG/5ML syrup Take 5 mLs by mouth at bedtime as needed for cough. 40 mL 1   No current facility-administered medications on file prior to visit.     Social History  Substance Use Topics  . Smoking status: Never Smoker  . Smokeless tobacco: Never Used  . Alcohol use 1.2 oz/week    2 Glasses of wine per week    Review of Systems  Constitutional: Negative for chills and fever.  Respiratory: Negative  for cough.   Cardiovascular: Negative for chest pain and palpitations.  Gastrointestinal: Negative for nausea and vomiting.  Musculoskeletal: Negative for arthralgias.  Skin: Positive for rash.  Neurological: Negative for headaches.      Objective:    BP 130/80   Pulse 68   Temp 98.3 F (36.8 C) (Oral)   Ht 5\' 6"  (1.676 m)   Wt 178 lb (80.7 kg)   LMP 05/26/2016 Comment: preg test negative  SpO2 99%   BMI 28.73 kg/m    Physical Exam  Constitutional: She appears well-developed and well-nourished.  Eyes: Conjunctivae are normal.  Cardiovascular: Normal rate, regular rhythm, normal heart sounds and normal pulses.   Pulmonary/Chest: Effort normal and breath sounds normal. She has no wheezes. She has no rhonchi. She has no rales.  Neurological: She is alert.  Skin:  Skin is warm and dry.     Erythematous clearly demarcated area right shin as noted on diagram. Central puncture noted in the middle with scab. Non fluctuant. No drainage. No streaking, no increased warmth.  Psychiatric: She has a normal mood and affect. Her speech is normal and behavior is normal. Thought content normal.  Vitals reviewed.      Assessment & Plan:   Problem List Items Addressed This Visit      Other   Cellulitis of right lower extremity - Primary    Afebrile and patient is well-appearing. Failed Keflex. We'll start Bactrim to cover for beta hemolytic hemolytic strep and MRSA. No drainage to collect wound culture. Advised patient if any drainage occurs, she may use wound culture at home and bring it back to [place]. Based on duration of infection, pending blood cultures, x-ray to ensure infection has remained localized. Advised very close vigilance and patient will let me know how she's doing in the next 24 hours to see if the Bactrim is resolving infection.      Relevant Medications   sulfamethoxazole-trimethoprim (BACTRIM DS) 800-160 MG tablet   Other Relevant Orders   Blood culture (routine single)   Wound culture   DG Tibia/Fibula Right        I am having [name] start on sulfamethoxazole-trimethoprim. I am also having her maintain her Fluticasone Propionate (FLONASE NA), cetirizine, albuterol, promethazine-dextromethorphan, buPROPion, amoxicillin, amLODipine, lisinopril, and escitalopram.   Meds ordered this encounter  Medications  . sulfamethoxazole-trimethoprim (BACTRIM DS) 800-160 MG tablet    Sig: Take 1 tablet by mouth 2 (two) times daily.    Dispense:  20 tablet    Refill:  0    Order Specific Question:   Supervising Provider    Answer:   Crecencio Mc [[id number]]    Return precautions given.   Risks, benefits, and alternatives of the medications and treatment plan prescribed today were discussed, and patient expressed understanding.   Education  regarding symptom management and diagnosis given to patient on AVS.  Continue to follow with Burnard Hawthorne, FNP for routine health maintenance.   Jill Atkinson and I agreed with plan.   Mable Paris, FNP

## 2017-04-05 NOTE — Progress Notes (Signed)
Pre visit review using our clinic review tool, if applicable. No additional management support is needed unless otherwise documented below in the visit note. 

## 2017-04-07 NOTE — Progress Notes (Signed)
Patient stated that the infection isnt growing and it is not a red but its still there.

## 2017-04-10 ENCOUNTER — Encounter: Payer: Self-pay | Admitting: Family

## 2017-04-11 LAB — CULTURE, BLOOD (SINGLE): Organism ID, Bacteria: NO GROWTH

## 2017-04-16 ENCOUNTER — Other Ambulatory Visit: Payer: Self-pay | Admitting: Family

## 2017-04-16 DIAGNOSIS — I10 Essential (primary) hypertension: Secondary | ICD-10-CM

## 2017-05-06 ENCOUNTER — Other Ambulatory Visit: Payer: Self-pay | Admitting: Family

## 2017-05-06 DIAGNOSIS — I10 Essential (primary) hypertension: Secondary | ICD-10-CM

## 2017-05-10 NOTE — Telephone Encounter (Signed)
Pharmacy called requesting this refill. Please advise, thank you!

## 2017-06-27 ENCOUNTER — Other Ambulatory Visit: Payer: Self-pay | Admitting: Family

## 2017-08-05 ENCOUNTER — Other Ambulatory Visit: Payer: Self-pay | Admitting: Family

## 2017-08-05 DIAGNOSIS — I10 Essential (primary) hypertension: Secondary | ICD-10-CM

## 2017-08-24 ENCOUNTER — Encounter: Payer: Self-pay | Admitting: Family

## 2017-08-24 ENCOUNTER — Ambulatory Visit (INDEPENDENT_AMBULATORY_CARE_PROVIDER_SITE_OTHER): Payer: BLUE CROSS/BLUE SHIELD | Admitting: Family

## 2017-08-24 VITALS — BP 134/84 | HR 81 | Temp 98.4°F | Ht 66.0 in | Wt 178.4 lb

## 2017-08-24 DIAGNOSIS — F329 Major depressive disorder, single episode, unspecified: Secondary | ICD-10-CM | POA: Diagnosis not present

## 2017-08-24 DIAGNOSIS — Z0000 Encounter for general adult medical examination without abnormal findings: Secondary | ICD-10-CM | POA: Diagnosis not present

## 2017-08-24 DIAGNOSIS — F331 Major depressive disorder, recurrent, moderate: Secondary | ICD-10-CM | POA: Diagnosis not present

## 2017-08-24 DIAGNOSIS — I10 Essential (primary) hypertension: Secondary | ICD-10-CM

## 2017-08-24 DIAGNOSIS — J45990 Exercise induced bronchospasm: Secondary | ICD-10-CM

## 2017-08-24 DIAGNOSIS — F32A Depression, unspecified: Secondary | ICD-10-CM

## 2017-08-24 LAB — COMPREHENSIVE METABOLIC PANEL
ALT: 25 U/L (ref 0–35)
AST: 17 U/L (ref 0–37)
Albumin: 4.5 g/dL (ref 3.5–5.2)
Alkaline Phosphatase: 75 U/L (ref 39–117)
BUN: 11 mg/dL (ref 6–23)
CALCIUM: 10.2 mg/dL (ref 8.4–10.5)
CHLORIDE: 103 meq/L (ref 96–112)
CO2: 29 meq/L (ref 19–32)
Creatinine, Ser: 0.81 mg/dL (ref 0.40–1.20)
GFR: 78.56 mL/min (ref >60.00)
Glucose, Bld: 98 mg/dL (ref 70–99)
POTASSIUM: 4.8 meq/L (ref 3.5–5.1)
Sodium: 138 mEq/L (ref 135–145)
Total Bilirubin: 0.3 mg/dL (ref 0.2–1.2)
Total Protein: 7.3 g/dL (ref 6.0–8.3)

## 2017-08-24 LAB — CBC WITH DIFFERENTIAL/PLATELET
BASOS ABS: 0 10*3/uL (ref 0.0–0.1)
BASOS PCT: 0.3 % (ref 0.0–3.0)
Eosinophils Absolute: 0.1 10*3/uL (ref 0.0–0.7)
Eosinophils Relative: 1.9 % (ref 0.0–5.0)
HEMATOCRIT: 45.4 % (ref 36.0–46.0)
Hemoglobin: 15.2 g/dL — ABNORMAL HIGH (ref 12.0–15.0)
LYMPHS ABS: 1.7 10*3/uL (ref 0.7–4.0)
Lymphocytes Relative: 32.5 % (ref 12.0–46.0)
MCHC: 33.5 g/dL (ref 30.0–36.0)
MCV: 96.3 fl (ref 78.0–100.0)
MONOS PCT: 9.7 % (ref 3.0–12.0)
Monocytes Absolute: 0.5 10*3/uL (ref 0.1–1.0)
NEUTROS ABS: 2.9 10*3/uL (ref 1.4–7.7)
NEUTROS PCT: 55.6 % (ref 43.0–77.0)
PLATELETS: 331 10*3/uL (ref 150.0–400.0)
RBC: 4.71 Mil/uL (ref 3.87–5.11)
RDW: 12.8 % (ref 11.5–15.5)
WBC: 5.1 10*3/uL (ref 4.0–10.5)

## 2017-08-24 LAB — LIPID PANEL
CHOL/HDL RATIO: 4
CHOLESTEROL: 229 mg/dL — AB (ref 0–200)
HDL: 62.5 mg/dL (ref >39.00)
LDL Cholesterol: 145 mg/dL — ABNORMAL HIGH (ref 0–99)
NonHDL: 166.37
TRIGLYCERIDES: 109 mg/dL (ref 0.0–149.0)
VLDL: 21.8 mg/dL (ref 0.0–40.0)

## 2017-08-24 LAB — HEMOGLOBIN A1C: Hgb A1c MFr Bld: 5.3 % (ref 4.6–6.5)

## 2017-08-24 LAB — VITAMIN D 25 HYDROXY (VIT D DEFICIENCY, FRACTURES): VITD: 34.2 ng/mL (ref 30.00–100.00)

## 2017-08-24 LAB — TSH: TSH: 1.16 u[IU]/mL (ref 0.35–4.50)

## 2017-08-24 MED ORDER — BUPROPION HCL ER (XL) 300 MG PO TB24: 300.0000 mg | ORAL_TABLET | Freq: Every day | ORAL | 2 refills | Status: DC

## 2017-08-24 MED ORDER — LISINOPRIL 10 MG PO TABS: 20.0000 mg | ORAL_TABLET | Freq: Every day | ORAL | 1 refills | Status: DC

## 2017-08-24 MED ORDER — ALBUTEROL SULFATE HFA 108 (90 BASE) MCG/ACT IN AERS: 2.0000 | INHALATION_SPRAY | Freq: Four times a day (QID) | RESPIRATORY_TRACT | 3 refills | Status: DC | PRN

## 2017-08-24 MED ORDER — ESCITALOPRAM OXALATE 10 MG PO TABS: 10.0000 mg | ORAL_TABLET | Freq: Every day | ORAL | 1 refills | Status: DC

## 2017-08-24 NOTE — Patient Instructions (Addendum)
Pleasure seeing you, always!  We placed a referral for mammogram this year. I asked that you call one the below locations and schedule this when it is convenient for you.   As discussed, I would like you to ask for 3D mammogram over the traditional 2D mammogram as new evidence suggest 3D is superior.   Please note that NOT all insurance companies cover 3D and you may have to pay a higher copay. You may call your insurance company to further clarify your benefits.   Options for [place]  [place], [place]  * Offers 3D mammogram if you ask*   Health Maintenance, Female Adopting a healthy lifestyle and getting preventive care can go a long way to promote health and wellness. Talk with your health care provider about what schedule of regular examinations is right for you. This is a good chance for you to check in with your provider about disease prevention and staying healthy. In between checkups, there are plenty of things you can do on your own. Experts have done a lot of research about which lifestyle changes and preventive measures are most likely to keep you healthy. Ask your health care provider for more information. Weight and diet Eat a healthy diet  Be sure to include plenty of vegetables, fruits, low-fat dairy products, and lean protein.  Do not eat a lot of foods high in solid fats, added sugars, or salt.  Get regular exercise. This is one of the most important things you can do for your health. ? Most adults should exercise for at least 150 minutes each week. The exercise should increase your heart rate and make you sweat (moderate-intensity exercise). ? Most adults should also do strengthening exercises at least twice a week. This is in addition to the moderate-intensity exercise.  Maintain a healthy weight  Body mass index (BMI) is a measurement that can be used to identify possible weight problems. It  estimates body fat based on height and weight. Your health care provider can help determine your BMI and help you achieve or maintain a healthy weight.  For females 40 years of age and older: ? A BMI below 18.5 is considered underweight. ? A BMI of 18.5 to 24.9 is normal. ? A BMI of 25 to 29.9 is considered overweight. ? A BMI of 30 and above is considered obese.  Watch levels of cholesterol and blood lipids  You should start having your blood tested for lipids and cholesterol at 53 years of age, then have this test every 5 years.  You may need to have your cholesterol levels checked more often if: ? Your lipid or cholesterol levels are high. ? You are older than 53 years of age. ? You are at high risk for heart disease.  Cancer screening Lung Cancer  Lung cancer screening is recommended for adults 12-51 years old who are at high risk for lung cancer because of a history of smoking.  A yearly low-dose CT scan of the lungs is recommended for people who: ? Currently smoke. ? Have quit within the past 15 years. ? Have at least a 30-pack-year history of smoking. A pack year is smoking an average of one pack of cigarettes a day for 1 year.  Yearly screening should continue until it has been 15 years since you quit.  Yearly screening should stop if you develop a health problem that would prevent you from having lung  cancer treatment.  Breast Cancer  Practice breast self-awareness. This means understanding how your breasts normally appear and feel.  It also means doing regular breast self-exams. Let your health care provider know about any changes, no matter how small.  If you are in your 20s or 30s, you should have a clinical breast exam (CBE) by a health care provider every 1-3 years as part of a regular health exam.  If you are 88 or older, have a CBE every year. Also consider having a breast X-ray (mammogram) every year.  If you have a family history of breast cancer, talk to  your health care provider about genetic screening.  If you are at high risk for breast cancer, talk to your health care provider about having an MRI and a mammogram every year.  Breast cancer gene (BRCA) assessment is recommended for women who have family members with BRCA-related cancers. BRCA-related cancers include: ? Breast. ? Ovarian. ? Tubal. ? Peritoneal cancers.  Results of the assessment will determine the need for genetic counseling and BRCA1 and BRCA2 testing.  Cervical Cancer Your health care provider may recommend that you be screened regularly for cancer of the pelvic organs (ovaries, uterus, and vagina). This screening involves a pelvic examination, including checking for microscopic changes to the surface of your cervix (Pap test). You may be encouraged to have this screening done every 3 years, beginning at age 74.  For women ages 13-65, health care providers may recommend pelvic exams and Pap testing every 3 years, or they may recommend the Pap and pelvic exam, combined with testing for human papilloma virus (HPV), every 5 years. Some types of HPV increase your risk of cervical cancer. Testing for HPV may also be done on women of any age with unclear Pap test results.  Other health care providers may not recommend any screening for nonpregnant women who are considered low risk for pelvic cancer and who do not have symptoms. Ask your health care provider if a screening pelvic exam is right for you.  If you have had past treatment for cervical cancer or a condition that could lead to cancer, you need Pap tests and screening for cancer for at least 20 years after your treatment. If Pap tests have been discontinued, your risk factors (such as having a new sexual partner) need to be reassessed to determine if screening should resume. Some women have medical problems that increase the chance of getting cervical cancer. In these cases, your health care provider may recommend more  frequent screening and Pap tests.  Colorectal Cancer  This type of cancer can be detected and often prevented.  Routine colorectal cancer screening usually begins at 53 years of age and continues through 53 years of age.  Your health care provider may recommend screening at an earlier age if you have risk factors for colon cancer.  Your health care provider may also recommend using home test kits to check for hidden blood in the stool.  A small camera at the end of a tube can be used to examine your colon directly (sigmoidoscopy or colonoscopy). This is done to check for the earliest forms of colorectal cancer.  Routine screening usually begins at age 88.  Direct examination of the colon should be repeated every 5-10 years through 53 years of age. However, you may need to be screened more often if early forms of precancerous polyps or small growths are found.  Skin Cancer  Check your skin from head to toe  regularly.  Tell your health care provider about any new moles or changes in moles, especially if there is a change in a mole's shape or color.  Also tell your health care provider if you have a mole that is larger than the size of a pencil eraser.  Always use sunscreen. Apply sunscreen liberally and repeatedly throughout the day.  Protect yourself by wearing long sleeves, pants, a wide-brimmed hat, and sunglasses whenever you are outside.  Heart disease, diabetes, and high blood pressure  High blood pressure causes heart disease and increases the risk of stroke. High blood pressure is more likely to develop in: ? People who have blood pressure in the high end of the normal range (130-139/85-89 mm Hg). ? People who are overweight or obese. ? People who are African American.  If you are 25-47 years of age, have your blood pressure checked every 3-5 years. If you are 4 years of age or older, have your blood pressure checked every year. You should have your blood pressure measured  twice-once when you are at a hospital or clinic, and once when you are not at a hospital or clinic. Record the average of the two measurements. To check your blood pressure when you are not at a hospital or clinic, you can use: ? An automated blood pressure machine at a pharmacy. ? A home blood pressure monitor.  If you are between 1 years and 1 years old, ask your health care provider if you should take aspirin to prevent strokes.  Have regular diabetes screenings. This involves taking a blood sample to check your fasting blood sugar level. ? If you are at a normal weight and have a low risk for diabetes, have this test once every three years after 53 years of age. ? If you are overweight and have a high risk for diabetes, consider being tested at a younger age or more often. Preventing infection Hepatitis B  If you have a higher risk for hepatitis B, you should be screened for this virus. You are considered at high risk for hepatitis B if: ? You were born in a country where hepatitis B is common. Ask your health care provider which countries are considered high risk. ? Your parents were born in a high-risk country, and you have not been immunized against hepatitis B (hepatitis B vaccine). ? You have HIV or AIDS. ? You use needles to inject street drugs. ? You live with someone who has hepatitis B. ? You have had sex with someone who has hepatitis B. ? You get hemodialysis treatment. ? You take certain medicines for conditions, including cancer, organ transplantation, and autoimmune conditions.  Hepatitis C  Blood testing is recommended for: ? Everyone born from [birth year] through 1967. ? Anyone with known risk factors for hepatitis C.  Sexually transmitted infections (STIs)  You should be screened for sexually transmitted infections (STIs) including gonorrhea and chlamydia if: ? You are sexually active and are younger than 53 years of age. ? You are older than 53 years of age and your  health care provider tells you that you are at risk for this type of infection. ? Your sexual activity has changed since you were last screened and you are at an increased risk for chlamydia or gonorrhea. Ask your health care provider if you are at risk.  If you do not have HIV, but are at risk, it may be recommended that you take a prescription medicine daily to prevent HIV infection. This  is called pre-exposure prophylaxis (PrEP). You are considered at risk if: ? You are sexually active and do not regularly use condoms or know the HIV status of your partner(s). ? You take drugs by injection. ? You are sexually active with a partner who has HIV.  Talk with your health care provider about whether you are at high risk of being infected with HIV. If you choose to begin PrEP, you should first be tested for HIV. You should then be tested every 3 months for as long as you are taking PrEP. Pregnancy  If you are premenopausal and you may become pregnant, ask your health care provider about preconception counseling.  If you may become pregnant, take 400 to 800 micrograms (mcg) of folic acid every day.  If you want to prevent pregnancy, talk to your health care provider about birth control (contraception). Osteoporosis and menopause  Osteoporosis is a disease in which the bones lose minerals and strength with aging. This can result in serious bone fractures. Your risk for osteoporosis can be identified using a bone density scan.  If you are 48 years of age or older, or if you are at risk for osteoporosis and fractures, ask your health care provider if you should be screened.  Ask your health care provider whether you should take a calcium or vitamin D supplement to lower your risk for osteoporosis.  Menopause may have certain physical symptoms and risks.  Hormone replacement therapy may reduce some of these symptoms and risks. Talk to your health care provider about whether hormone replacement  therapy is right for you. Follow these instructions at home:  Schedule regular health, dental, and eye exams.  Stay current with your immunizations.  Do not use any tobacco products including cigarettes, chewing tobacco, or electronic cigarettes.  If you are pregnant, do not drink alcohol.  If you are breastfeeding, limit how much and how often you drink alcohol.  Limit alcohol intake to no more than 1 drink per day for nonpregnant women. One drink equals 12 ounces of beer, 5 ounces of wine, or 1 ounces of hard liquor.  Do not use street drugs.  Do not share needles.  Ask your health care provider for help if you need support or information about quitting drugs.  Tell your health care provider if you often feel depressed.  Tell your health care provider if you have ever been abused or do not feel safe at home. This information is not intended to replace advice given to you by your health care provider. Make sure you discuss any questions you have with your health care provider. Document Released: 04/18/2011 Document Revised: 03/10/2016 Document Reviewed: 07/07/2015 Elsevier Interactive Patient Education  2018 [facility], [place] * Note if you ask for 3D mammogram at this location, you must request [place], [place] location*

## 2017-08-24 NOTE — Progress Notes (Signed)
Pre visit review using our clinic review tool, if applicable. No additional management support is needed unless otherwise documented below in the visit note. 

## 2017-08-24 NOTE — Assessment & Plan Note (Signed)
Stable. Will continue current regimen

## 2017-08-24 NOTE — Assessment & Plan Note (Signed)
Stable on current pharmacologic regimen. Patient would like to also do CPT. Referral placed.

## 2017-08-24 NOTE — Assessment & Plan Note (Signed)
Declines pelvic exam in the absence symptoms, and Pap is up-to-date. Breast exam performed. Mammogram ordered. Screening labs ordered. Patient declines referral dermatology as states she will call her prior dermatologist to schedule follow-up.

## 2017-08-24 NOTE — Assessment & Plan Note (Signed)
Elevated today. Patient will keep blood pressure log at home and let me know persistently elevated.

## 2017-08-24 NOTE — Progress Notes (Signed)
Subjective:    Patient ID: Jill Atkinson, female    DOB: 10/13/1966, 51 y.o.   MRN: [id number]  CC: Jill Atkinson is a 51 y.o. female who presents today for physical exam.    HPI: HTN- compliant with medication Denies exertional chest pain or pressure, numbness or tingling radiating to left arm or jaw, palpitations, dizziness, frequent headaches, changes in vision, or shortness of breath.   Depression and anxiety-doing well on current medications. However would also like to try CBT therapy. She has times that she feels a little more blue however not nearly as tearful. No thoughts of hurting herself or anyone else.\  Asthma-no recent flares. No wheezing, shortness of breath.  Would Like refills on medications     Colorectal Cancer Screening: UTD , dr Vira Agar,  Breast Cancer Screening: Mammogram due Cervical Cancer Screening: UTD, normal, neg malignancy, hpv Bone Health screening/DEXA for 65+: No increased fracture risk. Defer screening at this time. Lung Cancer Screening: Doesn't have 30 year pack year history and age > 31 years       Tetanus -         Hepatitis C screening - Candidate for, declines HIV Screening- Candidate for, declines Labs: Screening labs today. Exercise: Gets regular exercise.  Alcohol use: occasional Smoking/tobacco use: Nonsmoker.  Regular dental exams: UTD   Wears seat belt: Yes. Skin: no h/o skin cancer. Declines referral  HISTORY:  Past Medical History:  Diagnosis Date  . Allergy    hay fever  . Arrhythmia    AVNRT, s/p ablation Dr. Westley Gambles at [facility]  . Asthma    exercise induced  . Chicken pox   . Depression    post partum  . Hypertension   . Shingles   . UTI (lower urinary tract infection)     Past Surgical History:  Procedure Laterality Date  . CARDIAC ELECTROPHYSIOLOGY STUDY AND ABLATION  2012   . DILATION AND CURETTAGE OF UTERUS  1995   post delivery  . NASAL SINUS SURGERY  2006  . TONSILLECTOMY  1978   Family History   Problem Relation Age of Onset  . Arthritis Mother   . Hypertension Mother   . Arthritis Father   . Alcohol abuse Maternal Grandmother   . Drug abuse Maternal Grandmother   . Arthritis Maternal Grandmother   . Stroke Maternal Grandmother   . Hypertension Maternal Grandmother   . Alcohol abuse Maternal Grandfather   . Drug abuse Maternal Grandfather   . Arthritis Maternal Grandfather   . Hypertension Maternal Grandfather       ALLERGIES: Bactrim [sulfamethoxazole-trimethoprim]  Current Outpatient Medications on File Prior to Visit  Medication Sig Dispense Refill  . cetirizine (ZYRTEC) 10 MG tablet Take 10 mg by mouth daily.    . Fluticasone Propionate (FLONASE NA) Place into the nose.     No current facility-administered medications on file prior to visit.     Social History   Tobacco Use  . Smoking status: Never Smoker  . Smokeless tobacco: Never Used  Substance Use Topics  . Alcohol use: Yes    Alcohol/week: 1.2 oz    Types: 2 Glasses of wine per week  . Drug use: No    Review of Systems  Constitutional: Negative for chills, fever and unexpected weight change.  HENT: Negative for congestion.   Respiratory: Negative for cough.   Cardiovascular: Negative for chest pain, palpitations and leg swelling.  Gastrointestinal: Negative for nausea and vomiting.  Genitourinary: Negative for menstrual  problem, vaginal discharge and vaginal pain.  Musculoskeletal: Negative for arthralgias and myalgias.  Skin: Negative for rash.  Neurological: Negative for headaches.  Hematological: Negative for adenopathy.  Psychiatric/Behavioral: Negative for confusion and suicidal ideas.      Objective:    BP 134/84   Pulse 81   Temp 98.4 F (36.9 C) (Oral)   Ht 5\' 6"  (1.676 m)   Wt 178 lb 6.4 oz (80.9 kg)   LMP 05/26/2016 Comment: preg test negative  SpO2 97%   BMI 28.79 kg/m   BP Readings from Last 3 Encounters:  08/24/17 134/84  04/05/17 130/80  01/30/17 120/68   Wt  Readings from Last 3 Encounters:  08/24/17 178 lb 6.4 oz (80.9 kg)  04/05/17 178 lb (80.7 kg)  01/30/17 179 lb 12.8 oz (81.6 kg)    Physical Exam  Constitutional: She appears well-developed and well-nourished.  Eyes: Conjunctivae are normal.  Neck: No thyroid mass and no thyromegaly present.  Cardiovascular: Normal rate, regular rhythm, normal heart sounds and normal pulses.  Pulmonary/Chest: Effort normal and breath sounds normal. She has no wheezes. She has no rhonchi. She has no rales. Right breast exhibits no inverted nipple, no mass, no nipple discharge, no skin change and no tenderness. Left breast exhibits no inverted nipple, no mass, no nipple discharge, no skin change and no tenderness. Breasts are symmetrical.  CBE performed.   Lymphadenopathy:       Head (right side): No submental, no submandibular, no tonsillar, no preauricular, no posterior auricular and no occipital adenopathy present.       Head (left side): No submental, no submandibular, no tonsillar, no preauricular, no posterior auricular and no occipital adenopathy present.    She has no cervical adenopathy.       Right cervical: No superficial cervical, no deep cervical and no posterior cervical adenopathy present.      Left cervical: No superficial cervical, no deep cervical and no posterior cervical adenopathy present.    She has no axillary adenopathy.  Neurological: She is alert.  Skin: Skin is warm and dry.  Psychiatric: She has a normal mood and affect. Her speech is normal and behavior is normal. Thought content normal.  Vitals reviewed.      Assessment & Plan:   Problem List Items Addressed This Visit      Cardiovascular and Mediastinum   Hypertension    Elevated today. Patient will keep blood pressure log at home and let me know persistently elevated.      Relevant Medications   lisinopril (PRINIVIL,ZESTRIL) 10 MG tablet     Respiratory   Exercise-induced asthma    Stable. Will continue current  regimen      Relevant Medications   albuterol (VENTOLIN HFA) 108 (90 Base) MCG/ACT inhaler     Other   Routine general medical examination at a health care facility - Primary    Declines pelvic exam in the absence symptoms, and Pap is up-to-date. Breast exam performed. Mammogram ordered. Screening labs ordered. Patient declines referral dermatology as states she will call her prior dermatologist to schedule follow-up.      Relevant Orders   CBC with Differential/Platelet (Completed)   Comprehensive metabolic panel (Completed)   Hemoglobin A1c (Completed)   Lipid panel (Completed)   TSH (Completed)   VITAMIN D 25 Hydroxy (Vit-D Deficiency, Fractures) (Completed)   MM SCREENING BREAST TOMO BILATERAL   MM SCREENING BREAST TOMO BILATERAL   Depression    Stable on current pharmacologic regimen. Patient would  like to also do CPT. Referral placed.      Relevant Medications   escitalopram (LEXAPRO) 10 MG tablet   buPROPion (WELLBUTRIN XL) 300 MG 24 hr tablet   Other Relevant Orders   Ambulatory referral to Psychology       I have discontinued Lanice Schwab. Brent's promethazine-dextromethorphan, amoxicillin, amLODipine, sulfamethoxazole-trimethoprim, and amLODipine. I have also changed her escitalopram, lisinopril, buPROPion, and albuterol. Additionally, I am having her maintain her Fluticasone Propionate (FLONASE NA) and cetirizine.   Meds ordered this encounter  Medications  . escitalopram (LEXAPRO) 10 MG tablet    Sig: Take 1 tablet (10 mg total) daily for 30 doses by mouth.    Dispense:  90 tablet    Refill:  1    Order Specific Question:   Supervising Provider    Answer:   Deborra Medina L [[id number]]  . lisinopril (PRINIVIL,ZESTRIL) 10 MG tablet    Sig: Take 2 tablets (20 mg total) daily by mouth.    Dispense:  90 tablet    Refill:  1    Order Specific Question:   Supervising Provider    Answer:   Deborra Medina L [[id number]]  . buPROPion (WELLBUTRIN XL) 300 MG 24 hr tablet    Sig:  Take 1 tablet (300 mg total) daily by mouth.    Dispense:  90 tablet    Refill:  2    Order Specific Question:   Supervising Provider    Answer:   Deborra Medina L [[id number]]  . albuterol (VENTOLIN HFA) 108 (90 Base) MCG/ACT inhaler    Sig: Inhale 2 puffs every 6 (six) hours as needed into the lungs for wheezing.    Dispense:  18 g    Refill:  3    Order Specific Question:   Supervising Provider    Answer:   Crecencio Mc [[id number]]    Return precautions given.   Risks, benefits, and alternatives of the medications and treatment plan prescribed today were discussed, and patient expressed understanding.   Education regarding symptom management and diagnosis given to patient on AVS.   Continue to follow with Burnard Hawthorne, FNP for routine health maintenance.   Jill Atkinson and I agreed with plan.   Mable Paris, FNP

## 2017-09-14 ENCOUNTER — Ambulatory Visit: Payer: BLUE CROSS/BLUE SHIELD | Admitting: Psychology

## 2017-09-14 DIAGNOSIS — J019 Acute sinusitis, unspecified: Secondary | ICD-10-CM | POA: Diagnosis not present

## 2017-09-14 DIAGNOSIS — F341 Dysthymic disorder: Secondary | ICD-10-CM

## 2017-09-14 DIAGNOSIS — R0982 Postnasal drip: Secondary | ICD-10-CM | POA: Diagnosis not present

## 2017-09-14 DIAGNOSIS — F419 Anxiety disorder, unspecified: Secondary | ICD-10-CM | POA: Diagnosis not present

## 2017-10-04 ENCOUNTER — Ambulatory Visit
Admission: RE | Admit: 2017-10-04 | Discharge: 2017-10-04 | Disposition: A | Discharge status: Home / Self Care | Payer: BLUE CROSS/BLUE SHIELD | Source: Ambulatory Visit | Attending: Family | Admitting: Family

## 2017-10-04 DIAGNOSIS — Z0000 Encounter for general adult medical examination without abnormal findings: Secondary | ICD-10-CM | POA: Insufficient documentation

## 2017-10-04 DIAGNOSIS — Z1231 Encounter for screening mammogram for malignant neoplasm of breast: Secondary | ICD-10-CM | POA: Insufficient documentation

## 2017-12-13 DIAGNOSIS — J309 Allergic rhinitis, unspecified: Secondary | ICD-10-CM | POA: Diagnosis not present

## 2017-12-13 DIAGNOSIS — J342 Deviated nasal septum: Secondary | ICD-10-CM | POA: Diagnosis not present

## 2018-01-31 ENCOUNTER — Other Ambulatory Visit: Payer: Self-pay | Admitting: Family

## 2018-01-31 DIAGNOSIS — I10 Essential (primary) hypertension: Secondary | ICD-10-CM

## 2018-03-17 ENCOUNTER — Other Ambulatory Visit: Payer: Self-pay | Admitting: Family

## 2018-03-17 DIAGNOSIS — I10 Essential (primary) hypertension: Secondary | ICD-10-CM

## 2018-04-06 ENCOUNTER — Other Ambulatory Visit: Payer: Self-pay | Admitting: Family

## 2018-04-06 DIAGNOSIS — F32A Depression, unspecified: Secondary | ICD-10-CM

## 2018-04-06 DIAGNOSIS — F329 Major depressive disorder, single episode, unspecified: Secondary | ICD-10-CM

## 2018-04-12 DIAGNOSIS — M9902 Segmental and somatic dysfunction of thoracic region: Secondary | ICD-10-CM | POA: Diagnosis not present

## 2018-04-12 DIAGNOSIS — M62830 Muscle spasm of back: Secondary | ICD-10-CM | POA: Diagnosis not present

## 2018-04-12 DIAGNOSIS — M9901 Segmental and somatic dysfunction of cervical region: Secondary | ICD-10-CM | POA: Diagnosis not present

## 2018-04-12 DIAGNOSIS — M5412 Radiculopathy, cervical region: Secondary | ICD-10-CM | POA: Diagnosis not present

## 2018-04-14 DIAGNOSIS — M9902 Segmental and somatic dysfunction of thoracic region: Secondary | ICD-10-CM | POA: Diagnosis not present

## 2018-04-14 DIAGNOSIS — M5412 Radiculopathy, cervical region: Secondary | ICD-10-CM | POA: Diagnosis not present

## 2018-04-14 DIAGNOSIS — M62830 Muscle spasm of back: Secondary | ICD-10-CM | POA: Diagnosis not present

## 2018-04-14 DIAGNOSIS — M9901 Segmental and somatic dysfunction of cervical region: Secondary | ICD-10-CM | POA: Diagnosis not present

## 2018-04-28 DIAGNOSIS — M9901 Segmental and somatic dysfunction of cervical region: Secondary | ICD-10-CM | POA: Diagnosis not present

## 2018-04-28 DIAGNOSIS — M62830 Muscle spasm of back: Secondary | ICD-10-CM | POA: Diagnosis not present

## 2018-04-28 DIAGNOSIS — M9902 Segmental and somatic dysfunction of thoracic region: Secondary | ICD-10-CM | POA: Diagnosis not present

## 2018-04-28 DIAGNOSIS — M5412 Radiculopathy, cervical region: Secondary | ICD-10-CM | POA: Diagnosis not present

## 2018-05-02 ENCOUNTER — Other Ambulatory Visit: Payer: Self-pay | Admitting: Family

## 2018-05-02 DIAGNOSIS — I10 Essential (primary) hypertension: Secondary | ICD-10-CM

## 2018-05-26 DIAGNOSIS — M62830 Muscle spasm of back: Secondary | ICD-10-CM | POA: Diagnosis not present

## 2018-05-26 DIAGNOSIS — M5412 Radiculopathy, cervical region: Secondary | ICD-10-CM | POA: Diagnosis not present

## 2018-05-26 DIAGNOSIS — M9902 Segmental and somatic dysfunction of thoracic region: Secondary | ICD-10-CM | POA: Diagnosis not present

## 2018-05-26 DIAGNOSIS — M9901 Segmental and somatic dysfunction of cervical region: Secondary | ICD-10-CM | POA: Diagnosis not present

## 2018-07-09 DIAGNOSIS — D1801 Hemangioma of skin and subcutaneous tissue: Secondary | ICD-10-CM | POA: Diagnosis not present

## 2018-07-09 DIAGNOSIS — L573 Poikiloderma of Civatte: Secondary | ICD-10-CM | POA: Diagnosis not present

## 2018-07-09 DIAGNOSIS — L738 Other specified follicular disorders: Secondary | ICD-10-CM | POA: Diagnosis not present

## 2018-07-09 DIAGNOSIS — X32XXXA Exposure to sunlight, initial encounter: Secondary | ICD-10-CM | POA: Diagnosis not present

## 2018-08-27 ENCOUNTER — Encounter: Payer: Self-pay | Admitting: Family

## 2018-08-27 ENCOUNTER — Other Ambulatory Visit (HOSPITAL_COMMUNITY)
Admission: RE | Admit: 2018-08-27 | Discharge: 2018-08-27 | Disposition: A | Discharge status: Home / Self Care | Payer: BLUE CROSS/BLUE SHIELD | Source: Ambulatory Visit | Attending: Family | Admitting: Family

## 2018-08-27 ENCOUNTER — Ambulatory Visit (INDEPENDENT_AMBULATORY_CARE_PROVIDER_SITE_OTHER): Payer: BLUE CROSS/BLUE SHIELD | Admitting: Family

## 2018-08-27 VITALS — BP 140/90 | HR 85 | Temp 98.2°F | Resp 16 | Ht 66.0 in | Wt 193.5 lb

## 2018-08-27 DIAGNOSIS — Z0000 Encounter for general adult medical examination without abnormal findings: Secondary | ICD-10-CM

## 2018-08-27 DIAGNOSIS — J45990 Exercise induced bronchospasm: Secondary | ICD-10-CM | POA: Diagnosis not present

## 2018-08-27 DIAGNOSIS — Z124 Encounter for screening for malignant neoplasm of cervix: Secondary | ICD-10-CM | POA: Diagnosis not present

## 2018-08-27 DIAGNOSIS — Z23 Encounter for immunization: Secondary | ICD-10-CM | POA: Diagnosis not present

## 2018-08-27 DIAGNOSIS — I10 Essential (primary) hypertension: Secondary | ICD-10-CM

## 2018-08-27 DIAGNOSIS — F331 Major depressive disorder, recurrent, moderate: Secondary | ICD-10-CM

## 2018-08-27 MED ORDER — BUSPIRONE HCL 7.5 MG PO TABS: 7.5000 mg | ORAL_TABLET | Freq: Two times a day (BID) | ORAL | 2 refills | Status: DC

## 2018-08-27 NOTE — Assessment & Plan Note (Addendum)
Mammogram ordered, patient understands to schedule.  Clinical breast exam performed.  Pap smear performed.referral to medical weight loss.   Note: We discussed relevant and past history of abnormal labs today at length, we decided to order appropriate labs for screening today based on this discussion. Patient was comfortable with this. Patient was advised if any symptoms were to change after today's visit, to notify me as we may always order additional labs.

## 2018-08-27 NOTE — Addendum Note (Signed)
Addended by: Johna Sheriff on: 08/27/2018 03:57 PM   Modules accepted: Orders

## 2018-08-27 NOTE — Progress Notes (Signed)
Subjective:    Patient ID: Jill Atkinson, female    DOB: 09/04/1966, 52 y.o.   MRN: [id number]  CC: Jill Atkinson is a 52 y.o. female who presents today for physical exam.    HPI: Concern today is decreased libido. Lexapro has been helpful for anxiety. Endorses fatigue. No si/hi.   Asthma - has been using inhaler with resolve of cough.When cough is present, cough is 'all day long.'  Has seen Dr Tami Ribas. No sob, fever, wheezing.  Seasonal such as february symptoms will worsen with cough, nasal congestion. Using flonase, xyzal. Thinks cough is related allergies.  Had been on allergy shots in the past however insurance will not cover.   Occasionally has epigastric burning. No burping, belching.   HTN- compliant with lisinopril. Doesn't check at home, at dentist 135/88. No CP.     Colorectal Cancer Screening: UTD  Breast Cancer Screening: Mammogram due Cervical Cancer Screening: due  Immunizations       Tetanus - UTD      Labs: Screening labs today. Exercise: Gets regular exercise 4-5 mornings per week.  Alcohol use: Occasional Smoking/tobacco use: Nonsmoker.  Regular dental exams:UTD Wears seat belt: Yes. Skin: recently at dermatology for skin check.   HISTORY:  Past Medical History:  Diagnosis Date  . Allergy    hay fever  . Arrhythmia    AVNRT, s/p ablation Dr. Westley Gambles at [facility]  . Asthma    exercise induced  . Chicken pox   . Depression    post partum  . Hypertension   . Shingles   . UTI (lower urinary tract infection)     Past Surgical History:  Procedure Laterality Date  . CARDIAC ELECTROPHYSIOLOGY STUDY AND ABLATION  2012   . COLONOSCOPY WITH PROPOFOL N/A 11/02/2015   Procedure: COLONOSCOPY WITH PROPOFOL;  Surgeon: Manya Silvas, MD;  Location: [facility] ENDOSCOPY;  Service: Endoscopy;  Laterality: N/A;  . [place]   post delivery  . NASAL SINUS SURGERY  2006  . TONSILLECTOMY  1978   Family History  Problem Relation Age  of Onset  . Arthritis Mother   . Hypertension Mother   . Arthritis Father   . Alcohol abuse Maternal Grandmother   . Drug abuse Maternal Grandmother   . Arthritis Maternal Grandmother   . Stroke Maternal Grandmother   . Hypertension Maternal Grandmother   . Alcohol abuse Maternal Grandfather   . Drug abuse Maternal Grandfather   . Arthritis Maternal Grandfather   . Hypertension Maternal Grandfather       ALLERGIES: Bactrim [sulfamethoxazole-trimethoprim]  Current Outpatient Medications on File Prior to Visit  Medication Sig Dispense Refill  . albuterol (VENTOLIN HFA) 108 (90 Base) MCG/ACT inhaler Inhale 2 puffs every 6 (six) hours as needed into the lungs for wheezing. 18 g 3  . buPROPion (WELLBUTRIN XL) 300 MG 24 hr tablet Take 1 tablet (300 mg total) daily by mouth. 90 tablet 2  . Fluticasone Propionate (FLONASE NA) Place into the nose.    . Levocetirizine Dihydrochloride (XYZAL ALLERGY 24HR PO) Take by mouth daily.    Marland Kitchen lisinopril (PRINIVIL,ZESTRIL) 10 MG tablet TAKE 2 TABLETS (20 MG TOTAL) DAILY BY MOUTH. 60 tablet 0   No current facility-administered medications on file prior to visit.     Social History   Tobacco Use  . Smoking status: Never Smoker  . Smokeless tobacco: Never Used  Substance Use Topics  . Alcohol use: Yes    Alcohol/week:  2.0 standard drinks    Types: 2 Glasses of wine per week    Comment: occasional, wine  . Drug use: No    Review of Systems  Constitutional: Negative for chills and fever.  HENT: Positive for congestion and postnasal drip. Negative for sinus pain and sore throat.   Respiratory: Positive for cough. Negative for shortness of breath and wheezing.   Cardiovascular: Negative for chest pain and palpitations.  Gastrointestinal: Negative for nausea and vomiting.      Objective:    BP 140/90 (BP Location: Right Arm, Patient Position: Sitting, Cuff Size: Large)   Pulse 85   Temp 98.2 F (36.8 C) (Oral)   Resp 16   Ht 5\' 6"  (1.676  m)   Wt 193 lb 8 oz (87.8 kg)   LMP 05/26/2016 Comment: preg test negative  SpO2 100%   BMI 31.23 kg/m   BP Readings from Last 3 Encounters:  08/27/18 140/90  08/24/17 134/84  04/05/17 130/80   Wt Readings from Last 3 Encounters:  08/27/18 193 lb 8 oz (87.8 kg)  08/24/17 178 lb 6.4 oz (80.9 kg)  04/05/17 178 lb (80.7 kg)    Physical Exam  Constitutional: She appears well-developed and well-nourished.  HENT:  Head: Normocephalic and atraumatic.  Right Ear: Hearing, tympanic membrane, external ear and ear canal normal. No drainage, swelling or tenderness. No foreign bodies. Tympanic membrane is not erythematous and not bulging. No middle ear effusion. No decreased hearing is noted.  Left Ear: Hearing, tympanic membrane, external ear and ear canal normal. No drainage, swelling or tenderness. No foreign bodies. Tympanic membrane is not erythematous and not bulging.  No middle ear effusion. No decreased hearing is noted.  Nose: Nose normal. No rhinorrhea. Right sinus exhibits no maxillary sinus tenderness and no frontal sinus tenderness. Left sinus exhibits no maxillary sinus tenderness and no frontal sinus tenderness.  Mouth/Throat: Uvula is midline, oropharynx is clear and moist and mucous membranes are normal. No oropharyngeal exudate, posterior oropharyngeal edema, posterior oropharyngeal erythema or tonsillar abscesses.  Eyes: Conjunctivae are normal.  Neck: No thyroid mass and no thyromegaly present.  Cardiovascular: Normal rate, regular rhythm, normal heart sounds and normal pulses.  Pulmonary/Chest: Effort normal and breath sounds normal. She has no wheezes. She has no rhonchi. She has no rales. Right breast exhibits no inverted nipple, no mass, no nipple discharge, no skin change and no tenderness. Left breast exhibits no inverted nipple, no mass, no nipple discharge, no skin change and no tenderness. Breasts are symmetrical.  No masses or asymmetry appreciated during CBE.    Genitourinary: Uterus is not enlarged, not fixed and not tender. Cervix exhibits no motion tenderness, no discharge and no friability. Right adnexum displays no mass, no tenderness and no fullness. Left adnexum displays no mass, no tenderness and no fullness.  Genitourinary Comments: Pap performed. No CMT. Unable to appreciated ovaries.  Lymphadenopathy:       Head (right side): No submental, no submandibular, no tonsillar, no preauricular, no posterior auricular and no occipital adenopathy present.       Head (left side): No submental, no submandibular, no tonsillar, no preauricular, no posterior auricular and no occipital adenopathy present.    She has no cervical adenopathy.       Right cervical: No superficial cervical, no deep cervical and no posterior cervical adenopathy present.      Left cervical: No superficial cervical, no deep cervical and no posterior cervical adenopathy present.    She has no axillary  adenopathy.       Right axillary: No pectoral and no lateral adenopathy present.       Left axillary: No pectoral and no lateral adenopathy present. Neurological: She is alert.  Skin: Skin is warm and dry.  Psychiatric: She has a normal mood and affect. Her speech is normal and behavior is normal. Thought content normal.  Vitals reviewed.      Assessment & Plan:   Problem List Items Addressed This Visit      Cardiovascular and Mediastinum   Hypertension    Elevated today.  Discussed the newest blood pressure guidelines with patient.  She prefers to pursue weight loss, and monitor at home.  She will let me know readings from home.        Respiratory   Exercise-induced asthma    Chronic cough.  Discussed differentials including allergic rhinitis, asthma, GERD.  Patient will do trial of Pepcid AC.  At Follow-up if persists, will consider Symbicort, pulmonology referral.        Other   Routine general medical examination at a health care facility - Primary    Mammogram  ordered, patient understands to schedule.  Clinical breast exam performed.  Pap smear performed.referral to medical weight loss.   Note: We discussed relevant and past history of abnormal labs today at length, we decided to order appropriate labs for screening today based on this discussion. Patient was comfortable with this. Patient was advised if any symptoms were to change after today's visit, to notify me as we may always order additional labs.       Relevant Orders   Amb Ref to Medical Weight Management   Comprehensive metabolic panel   TSH   MM 3D SCREEN BREAST BILATERAL   Depression    Due to decreased libido, will stop lexapro. Start buspar with close monitoring.       Relevant Medications   busPIRone (BUSPAR) 7.5 MG tablet       I have discontinued Lanice Schwab. Arvelo's cetirizine and escitalopram. I am also having her start on busPIRone. Additionally, I am having her maintain her Fluticasone Propionate (FLONASE NA), buPROPion, albuterol, lisinopril, and Levocetirizine Dihydrochloride (XYZAL ALLERGY 24HR PO).   Meds ordered this encounter  Medications  . busPIRone (BUSPAR) 7.5 MG tablet    Sig: Take 1 tablet (7.5 mg total) by mouth 2 (two) times daily.    Dispense:  60 tablet    Refill:  2    Order Specific Question:   Supervising Provider    Answer:   Crecencio Mc [[id number]]    Return precautions given.   Risks, benefits, and alternatives of the medications and treatment plan prescribed today were discussed, and patient expressed understanding.   Education regarding symptom management and diagnosis given to patient on AVS.   Continue to follow with Burnard Hawthorne, FNP for routine health maintenance.   Jill Atkinson and I agreed with plan.   Mable Paris, FNP

## 2018-08-27 NOTE — Assessment & Plan Note (Signed)
Elevated today.  Discussed the newest blood pressure guidelines with patient.  She prefers to pursue weight loss, and monitor at home.  She will let me know readings from home.

## 2018-08-27 NOTE — Assessment & Plan Note (Signed)
Due to decreased libido, will stop lexapro. Start buspar with close monitoring.

## 2018-08-27 NOTE — Patient Instructions (Addendum)
Stop lexapro.  You need to wean off this medication.  Please start taking this medication every other day for the next week.  For the following week may take every third day.  As long as you feel well, you may discontinue this medication after the second week.   Start buspar AFTER you have stopped the lexapro.   Monitor blood pressure,  Goal is less than 120/80, based on newest guidelines; if persistently higher, please make sooner follow up appointment so we can recheck you blood pressure and manage medications   Today we discussed referrals, orders. Medical weight loss- Redgie Grayer   I have placed these orders in the system for you.  Please be sure to give [place] a call if you have not heard from our office regarding this. We should hear from [place] within ONE week with information regarding your appointment. If not, please let me know immediately.   Trial of Pepcid AC - this is twice daily to see if cough improves.    We placed a referral for mammogram this year. I asked that you call one the below locations and schedule this when it is convenient for you.   As discussed, I would like you to ask for 3D mammogram over the traditional 2D mammogram as new evidence suggest 3D is superior.   Please note that NOT all insurance companies cover 3D and you may have to pay a higher copay. You may call your insurance company to further clarify your benefits.   Options for [place]  [place], [place]  * [facility] [place],  [place] * Note if you ask for 3D mammogram at this location, you must request [place], [place] location*      Health Maintenance, Female Adopting a healthy lifestyle and getting preventive care can go a long way to promote health and wellness. Talk with your health care provider about what schedule of regular examinations is  right for you. This is a good chance for you to check in with your provider about disease prevention and staying healthy. In between checkups, there are plenty of things you can do on your own. Experts have done a lot of research about which lifestyle changes and preventive measures are most likely to keep you healthy. Ask your health care provider for more information. Weight and diet Eat a healthy diet  Be sure to include plenty of vegetables, fruits, low-fat dairy products, and lean protein.  Do not eat a lot of foods high in solid fats, added sugars, or salt.  Get regular exercise. This is one of the most important things you can do for your health. ? Most adults should exercise for at least 150 minutes each week. The exercise should increase your heart rate and make you sweat (moderate-intensity exercise). ? Most adults should also do strengthening exercises at least twice a week. This is in addition to the moderate-intensity exercise.  Maintain a healthy weight  Body mass index (BMI) is a measurement that can be used to identify possible weight problems. It estimates body fat based on height and weight. Your health care provider can help determine your BMI and help you achieve or maintain a healthy weight.  For females 80 years of age and older: ? A BMI below 18.5 is considered underweight. ? A BMI of 18.5 to 24.9 is normal. ? A  BMI of 25 to 29.9 is considered overweight. ? A BMI of 30 and above is considered obese.  Watch levels of cholesterol and blood lipids  You should start having your blood tested for lipids and cholesterol at 54 years of age, then have this test every 5 years.  You may need to have your cholesterol levels checked more often if: ? Your lipid or cholesterol levels are high. ? You are older than 54 years of age. ? You are at high risk for heart disease.  Cancer screening Lung Cancer  Lung cancer screening is recommended for adults 48-36 years old who are  at high risk for lung cancer because of a history of smoking.  A yearly low-dose CT scan of the lungs is recommended for people who: ? Currently smoke. ? Have quit within the past 15 years. ? Have at least a 30-pack-year history of smoking. A pack year is smoking an average of one pack of cigarettes a day for 1 year.  Yearly screening should continue until it has been 15 years since you quit.  Yearly screening should stop if you develop a health problem that would prevent you from having lung cancer treatment.  Breast Cancer  Practice breast self-awareness. This means understanding how your breasts normally appear and feel.  It also means doing regular breast self-exams. Let your health care provider know about any changes, no matter how small.  If you are in your 20s or 30s, you should have a clinical breast exam (CBE) by a health care provider every 1-3 years as part of a regular health exam.  If you are 71 or older, have a CBE every year. Also consider having a breast X-ray (mammogram) every year.  If you have a family history of breast cancer, talk to your health care provider about genetic screening.  If you are at high risk for breast cancer, talk to your health care provider about having an MRI and a mammogram every year.  Breast cancer gene (BRCA) assessment is recommended for women who have family members with BRCA-related cancers. BRCA-related cancers include: ? Breast. ? Ovarian. ? Tubal. ? Peritoneal cancers.  Results of the assessment will determine the need for genetic counseling and BRCA1 and BRCA2 testing.  Cervical Cancer Your health care provider may recommend that you be screened regularly for cancer of the pelvic organs (ovaries, uterus, and vagina). This screening involves a pelvic examination, including checking for microscopic changes to the surface of your cervix (Pap test). You may be encouraged to have this screening done every 3 years, beginning at age  45.  For women ages 20-65, health care providers may recommend pelvic exams and Pap testing every 3 years, or they may recommend the Pap and pelvic exam, combined with testing for human papilloma virus (HPV), every 5 years. Some types of HPV increase your risk of cervical cancer. Testing for HPV may also be done on women of any age with unclear Pap test results.  Other health care providers may not recommend any screening for nonpregnant women who are considered low risk for pelvic cancer and who do not have symptoms. Ask your health care provider if a screening pelvic exam is right for you.  If you have had past treatment for cervical cancer or a condition that could lead to cancer, you need Pap tests and screening for cancer for at least 20 years after your treatment. If Pap tests have been discontinued, your risk factors (such as having a new  sexual partner) need to be reassessed to determine if screening should resume. Some women have medical problems that increase the chance of getting cervical cancer. In these cases, your health care provider may recommend more frequent screening and Pap tests.  Colorectal Cancer  This type of cancer can be detected and often prevented.  Routine colorectal cancer screening usually begins at 54 years of age and continues through 54 years of age.  Your health care provider may recommend screening at an earlier age if you have risk factors for colon cancer.  Your health care provider may also recommend using home test kits to check for hidden blood in the stool.  A small camera at the end of a tube can be used to examine your colon directly (sigmoidoscopy or colonoscopy). This is done to check for the earliest forms of colorectal cancer.  Routine screening usually begins at age 66.  Direct examination of the colon should be repeated every 5-10 years through 54 years of age. However, you may need to be screened more often if early forms of precancerous polyps  or small growths are found.  Skin Cancer  Check your skin from head to toe regularly.  Tell your health care provider about any new moles or changes in moles, especially if there is a change in a mole's shape or color.  Also tell your health care provider if you have a mole that is larger than the size of a pencil eraser.  Always use sunscreen. Apply sunscreen liberally and repeatedly throughout the day.  Protect yourself by wearing long sleeves, pants, a wide-brimmed hat, and sunglasses whenever you are outside.  Heart disease, diabetes, and high blood pressure  High blood pressure causes heart disease and increases the risk of stroke. High blood pressure is more likely to develop in: ? People who have blood pressure in the high end of the normal range (130-139/85-89 mm Hg). ? People who are overweight or obese. ? People who are African American.  If you are 36-24 years of age, have your blood pressure checked every 3-5 years. If you are 33 years of age or older, have your blood pressure checked every year. You should have your blood pressure measured twice-once when you are at a hospital or clinic, and once when you are not at a hospital or clinic. Record the average of the two measurements. To check your blood pressure when you are not at a hospital or clinic, you can use: ? An automated blood pressure machine at a pharmacy. ? A home blood pressure monitor.  If you are between 81 years and 67 years old, ask your health care provider if you should take aspirin to prevent strokes.  Have regular diabetes screenings. This involves taking a blood sample to check your fasting blood sugar level. ? If you are at a normal weight and have a low risk for diabetes, have this test once every three years after 55 years of age. ? If you are overweight and have a high risk for diabetes, consider being tested at a younger age or more often. Preventing infection Hepatitis B  If you have a higher  risk for hepatitis B, you should be screened for this virus. You are considered at high risk for hepatitis B if: ? You were born in a country where hepatitis B is common. Ask your health care provider which countries are considered high risk. ? Your parents were born in a high-risk country, and you have not been immunized  against hepatitis B (hepatitis B vaccine). ? You have HIV or AIDS. ? You use needles to inject street drugs. ? You live with someone who has hepatitis B. ? You have had sex with someone who has hepatitis B. ? You get hemodialysis treatment. ? You take certain medicines for conditions, including cancer, organ transplantation, and autoimmune conditions.  Hepatitis C  Blood testing is recommended for: ? Everyone born from [birth year] through 1967. ? Anyone with known risk factors for hepatitis C.  Sexually transmitted infections (STIs)  You should be screened for sexually transmitted infections (STIs) including gonorrhea and chlamydia if: ? You are sexually active and are younger than 54 years of age. ? You are older than 54 years of age and your health care provider tells you that you are at risk for this type of infection. ? Your sexual activity has changed since you were last screened and you are at an increased risk for chlamydia or gonorrhea. Ask your health care provider if you are at risk.  If you do not have HIV, but are at risk, it may be recommended that you take a prescription medicine daily to prevent HIV infection. This is called pre-exposure prophylaxis (PrEP). You are considered at risk if: ? You are sexually active and do not regularly use condoms or know the HIV status of your partner(s). ? You take drugs by injection. ? You are sexually active with a partner who has HIV.  Talk with your health care provider about whether you are at high risk of being infected with HIV. If you choose to begin PrEP, you should first be tested for HIV. You should then be tested  every 3 months for as long as you are taking PrEP. Pregnancy  If you are premenopausal and you may become pregnant, ask your health care provider about preconception counseling.  If you may become pregnant, take 400 to 800 micrograms (mcg) of folic acid every day.  If you want to prevent pregnancy, talk to your health care provider about birth control (contraception). Osteoporosis and menopause  Osteoporosis is a disease in which the bones lose minerals and strength with aging. This can result in serious bone fractures. Your risk for osteoporosis can be identified using a bone density scan.  If you are 17 years of age or older, or if you are at risk for osteoporosis and fractures, ask your health care provider if you should be screened.  Ask your health care provider whether you should take a calcium or vitamin D supplement to lower your risk for osteoporosis.  Menopause may have certain physical symptoms and risks.  Hormone replacement therapy may reduce some of these symptoms and risks. Talk to your health care provider about whether hormone replacement therapy is right for you. Follow these instructions at home:  Schedule regular health, dental, and eye exams.  Stay current with your immunizations.  Do not use any tobacco products including cigarettes, chewing tobacco, or electronic cigarettes.  If you are pregnant, do not drink alcohol.  If you are breastfeeding, limit how much and how often you drink alcohol.  Limit alcohol intake to no more than 1 drink per day for nonpregnant women. One drink equals 12 ounces of beer, 5 ounces of wine, or 1 ounces of hard liquor.  Do not use street drugs.  Do not share needles.  Ask your health care provider for help if you need support or information about quitting drugs.  Tell your health care provider if you  often feel depressed.  Tell your health care provider if you have ever been abused or do not feel safe at home. This  information is not intended to replace advice given to you by your health care provider. Make sure you discuss any questions you have with your health care provider. Document Released: 04/18/2011 Document Revised: 03/10/2016 Document Reviewed: 07/07/2015 Elsevier Interactive Patient Education  [employer].

## 2018-08-27 NOTE — Assessment & Plan Note (Signed)
Chronic cough.  Discussed differentials including allergic rhinitis, asthma, GERD.  Patient will do trial of Pepcid AC.  At Follow-up if persists, will consider Symbicort, pulmonology referral.

## 2018-08-28 ENCOUNTER — Encounter: Payer: Self-pay | Admitting: Family

## 2018-08-28 ENCOUNTER — Other Ambulatory Visit: Payer: Self-pay | Admitting: Family

## 2018-08-28 DIAGNOSIS — F329 Major depressive disorder, single episode, unspecified: Secondary | ICD-10-CM

## 2018-08-28 DIAGNOSIS — J45990 Exercise induced bronchospasm: Secondary | ICD-10-CM

## 2018-08-28 DIAGNOSIS — I10 Essential (primary) hypertension: Secondary | ICD-10-CM

## 2018-08-28 DIAGNOSIS — F32A Depression, unspecified: Secondary | ICD-10-CM

## 2018-08-28 LAB — COMPREHENSIVE METABOLIC PANEL
ALBUMIN: 4.5 g/dL (ref 3.5–5.2)
ALT: 23 U/L (ref 0–35)
AST: 19 U/L (ref 0–37)
Alkaline Phosphatase: 76 U/L (ref 39–117)
BUN: 15 mg/dL (ref 6–23)
CALCIUM: 9.6 mg/dL (ref 8.4–10.5)
CHLORIDE: 104 meq/L (ref 96–112)
CO2: 26 meq/L (ref 19–32)
CREATININE: 0.88 mg/dL (ref 0.40–1.20)
GFR: 71.12 mL/min (ref >60.00)
Glucose, Bld: 104 mg/dL — ABNORMAL HIGH (ref 70–99)
POTASSIUM: 4.1 meq/L (ref 3.5–5.1)
Sodium: 140 mEq/L (ref 135–145)
Total Bilirubin: 0.4 mg/dL (ref 0.2–1.2)
Total Protein: 6.9 g/dL (ref 6.0–8.3)

## 2018-08-28 LAB — TSH: TSH: 2.15 u[IU]/mL (ref 0.35–4.50)

## 2018-08-28 MED ORDER — LISINOPRIL 10 MG PO TABS: 20.0000 mg | ORAL_TABLET | Freq: Every day | ORAL | 0 refills | Status: DC

## 2018-08-29 MED ORDER — BUPROPION HCL ER (XL) 300 MG PO TB24: 300.0000 mg | ORAL_TABLET | Freq: Every day | ORAL | 1 refills | Status: DC

## 2018-08-29 MED ORDER — ALBUTEROL SULFATE HFA 108 (90 BASE) MCG/ACT IN AERS: 2.0000 | INHALATION_SPRAY | Freq: Four times a day (QID) | RESPIRATORY_TRACT | 3 refills | Status: DC | PRN

## 2018-08-30 LAB — CYTOLOGY - PAP
DIAGNOSIS: NEGATIVE
HPV: NOT DETECTED

## 2018-09-22 ENCOUNTER — Other Ambulatory Visit: Payer: Self-pay | Admitting: Family

## 2018-09-22 DIAGNOSIS — F331 Major depressive disorder, recurrent, moderate: Secondary | ICD-10-CM

## 2018-09-22 DIAGNOSIS — I10 Essential (primary) hypertension: Secondary | ICD-10-CM

## 2018-10-02 ENCOUNTER — Encounter (INDEPENDENT_AMBULATORY_CARE_PROVIDER_SITE_OTHER): Payer: Self-pay

## 2018-10-19 ENCOUNTER — Other Ambulatory Visit: Payer: Self-pay | Admitting: Family

## 2018-10-19 DIAGNOSIS — I10 Essential (primary) hypertension: Secondary | ICD-10-CM

## 2018-10-22 ENCOUNTER — Encounter: Payer: Self-pay | Admitting: Family

## 2018-10-29 ENCOUNTER — Encounter: Payer: Self-pay | Admitting: Family

## 2018-10-29 ENCOUNTER — Ambulatory Visit: Payer: BLUE CROSS/BLUE SHIELD | Admitting: Family

## 2018-10-29 DIAGNOSIS — F32A Depression, unspecified: Secondary | ICD-10-CM

## 2018-10-29 DIAGNOSIS — K219 Gastro-esophageal reflux disease without esophagitis: Secondary | ICD-10-CM

## 2018-10-29 DIAGNOSIS — I10 Essential (primary) hypertension: Secondary | ICD-10-CM

## 2018-10-29 DIAGNOSIS — F329 Major depressive disorder, single episode, unspecified: Secondary | ICD-10-CM

## 2018-10-29 MED ORDER — ESCITALOPRAM OXALATE 10 MG PO TABS: 10.0000 mg | ORAL_TABLET | Freq: Every day | ORAL | 3 refills | Status: DC

## 2018-10-29 MED ORDER — LISINOPRIL 20 MG PO TABS: 20.0000 mg | ORAL_TABLET | Freq: Every day | ORAL | 3 refills | Status: DC

## 2018-10-29 NOTE — Progress Notes (Signed)
Subjective:    Patient ID: Jill Atkinson, female    DOB: [birth year], 53 y.o.   MRN: [id number]  CC: Jill Atkinson is a 53 y.o. female who presents today for follow up.   HPI: Chronic cough- started prevacid and it has nearly resolved. Vomiting with cough has resolved.    Depression- felt better on Lexapro.  Has not felt as well on BuSpar.  More tearful.  No thoughts of hurting herself or anyone else.  Libido is unchanged.    Starting to see weight loss clinic this week.    HISTORY:  Past Medical History:  Diagnosis Date  . Allergy    hay fever  . Arrhythmia    AVNRT, s/p ablation Dr. Westley Gambles at [facility]  . Asthma    exercise induced  . Chicken pox   . Depression    post partum  . Hypertension   . Shingles   . UTI (lower urinary tract infection)    Past Surgical History:  Procedure Laterality Date  . CARDIAC ELECTROPHYSIOLOGY STUDY AND ABLATION  2012   . COLONOSCOPY WITH PROPOFOL N/A 11/02/2015   Procedure: COLONOSCOPY WITH PROPOFOL;  Surgeon: Jill Silvas, MD;  Location: [facility] END[facility]COPY;  Service: Endoscopy;  Laterality: N/A;  . [place]   post delivery  . NASAL SINUS SURGERY  2006  . TONSILLECTOMY  1978   Family History  Problem Relation Age of Onset  . Arthritis Mother   . Hypertension Mother   . Arthritis Father   . Alcohol abuse Maternal Grandmother   . Drug abuse Maternal Grandmother   . Arthritis Maternal Grandmother   . Stroke Maternal Grandmother   . Hypertension Maternal Grandmother   . Alcohol abuse Maternal Grandfather   . Drug abuse Maternal Grandfather   . Arthritis Maternal Grandfather   . Hypertension Maternal Grandfather     Allergies: Bactrim [sulfamethoxazole-trimethoprim] Current Outpatient Medications on File Prior to Visit  Medication Sig Dispense Refill  . albuterol (VENTOLIN HFA) 108 (90 Base) MCG/ACT inhaler Inhale 2 puffs into the lungs every 6 (six) hours as needed for wheezing. 18 g 3  .  buPROPion (WELLBUTRIN XL) 300 MG 24 hr tablet Take 1 tablet (300 mg total) by mouth daily. 90 tablet 1  . cetirizine (ZYRTEC) 10 MG tablet Take 10 mg by mouth daily.    . Fluticasone Propionate (FLONASE NA) Place into the nose.    . lansoprazole (PREVACID) 15 MG capsule Take 15 mg by mouth daily at 12 noon.    . Levocetirizine Dihydrochloride (XYZAL ALLERGY 24HR PO) Take by mouth daily.     No current facility-administered medications on file prior to visit.     Social History   Tobacco Use  . Smoking status: Never Smoker  . Smokeless tobacco: Never Used  Substance Use Topics  . Alcohol use: Yes    Alcohol/week: 2.0 standard drinks    Types: 2 Glasses of wine per week    Comment: occasional, wine  . Drug use: No    Review of Systems  Constitutional: Negative for chills and fever.  Respiratory: Positive for cough (improved).   Cardiovascular: Negative for chest pain and palpitations.  Gastrointestinal: Negative for nausea and vomiting.      Objective:    BP 130/82 (BP Location: Left Arm, Patient Position: Sitting, Cuff Size: Large)   Pulse 92   Temp 98.4 F (36.9 C)   Wt 193 lb 3.2 oz (87.6 kg)   LMP  05/26/2016 Comment: preg test negative  SpO2 98%   BMI 31.18 kg/m  BP Readings from Last 3 Encounters:  10/29/18 130/82  08/27/18 140/90  08/24/17 134/84   Wt Readings from Last 3 Encounters:  10/29/18 193 lb 3.2 oz (87.6 kg)  08/27/18 193 lb 8 oz (87.8 kg)  08/24/17 178 lb 6.4 oz (80.9 kg)    Physical Exam Vitals signs reviewed.  Constitutional:      Appearance: She is well-developed.  Eyes:     Conjunctiva/sclera: Conjunctivae normal.  Cardiovascular:     Rate and Rhythm: Normal rate and regular rhythm.     Pulses: Normal pulses.     Heart sounds: Normal heart sounds.  Pulmonary:     Effort: Pulmonary effort is normal.     Breath sounds: Normal breath sounds. No wheezing, rhonchi or rales.  Skin:    General: Skin is warm and dry.  Neurological:      Mental Status: She is alert.  Psychiatric:        Speech: Speech normal.        Behavior: Behavior normal.        Thought Content: Thought content normal.        Assessment & Plan:   Problem List Items Addressed This Visit      Cardiovascular and Mediastinum   Hypertension    At goal, continue regimen      Relevant Medications   lisinopril (PRINIVIL,ZESTRIL) 20 MG tablet     Digestive   GERD (gastroesophageal reflux disease)    Cough has largely resolved on Prevacid.  Discussed long-term risk of PPIs with patient today.  Education discussed and also printed on AVS.      Relevant Medications   lansoprazole (PREVACID) 15 MG capsule     Other   Depression    We discussed at length that suspect decreased libido, concentration, tearfulness are manifestations of worsening depression.  Will resume Lexapro, discontinue BuSpar.  She declines seeing the counselor at this time.  Referral made to GYN, Melody, for any further advice, consult in regards to decreased libido.      Relevant Medications   escitalopram (LEXAPRO) 10 MG tablet   Other Relevant Orders   Ambulatory referral to Obstetrics / Gynecology       I have discontinued Lanice Schwab. Jill Atkinson's lisinopril. I have also changed her escitalopram. Additionally, I am having her start on lisinopril. Lastly, I am having her maintain her Fluticasone Propionate (FLONASE NA), Levocetirizine Dihydrochloride (XYZAL ALLERGY 24HR PO), albuterol, buPROPion, cetirizine, and lansoprazole.   Meds ordered this encounter  Medications  . lisinopril (PRINIVIL,ZESTRIL) 20 MG tablet    Sig: Take 1 tablet (20 mg total) by mouth daily.    Dispense:  90 tablet    Refill:  3    Order Specific Question:   Supervising Provider    Answer:   Deborra Medina L [[id number]]  . escitalopram (LEXAPRO) 10 MG tablet    Sig: Take 1 tablet (10 mg total) by mouth daily for 30 doses.    Dispense:  90 tablet    Refill:  3    Order Specific Question:   Supervising  Provider    Answer:   Crecencio Mc [[id number]]    Return precautions given.   Risks, benefits, and alternatives of the medications and treatment plan prescribed today were discussed, and patient expressed understanding.   Education regarding symptom management and diagnosis given to patient on AVS.  Continue to follow with Burnard Hawthorne,  FNP for routine health maintenance.   Jill Atkinson and I agreed with plan.   Mable Paris, FNP

## 2018-10-29 NOTE — Patient Instructions (Addendum)
Today we discussed referrals, orders. Melody at [facility]    I have placed these orders in the system for you.  Please be sure to give [place] a call if you have not heard from our office regarding this. We should hear from [place] within ONE week with information regarding your appointment. If not, please let me know immediately.      Long term use beyond 3 months of proton pump inhibitors , also called PPI's, is associated with malabsorption of vitamins, chronic kidney disease, fracture risk, and diarrheal illnesses. PPI's include Nexium, Prilosec, Protonix, Dexilant, and Prevacid.   I generally recommend trying to control acid reflux with lifestyle modifications including avoiding trigger foods, not eating 2 hours prior to bedtime. You may use histamine 2 blockers daily to twice daily ( this is Zantac, Pepcid) and then when symptoms flare, start back on PPI for short course.   Of note, we will need to do an endoscopy ( upper GI) to evaluate your esophagus, stomach in the future if acid reflux persists are you develop red flag symptoms: trouble swallowing, hoarseness, chronic cough, unexplained weight loss.

## 2018-10-29 NOTE — Assessment & Plan Note (Signed)
We discussed at length that suspect decreased libido, concentration, tearfulness are manifestations of worsening depression.  Will resume Lexapro, discontinue BuSpar.  She declines seeing the counselor at this time.  Referral made to GYN, Melody, for any further advice, consult in regards to decreased libido.

## 2018-10-29 NOTE — Assessment & Plan Note (Signed)
Cough has largely resolved on Prevacid.  Discussed long-term risk of PPIs with patient today.  Education discussed and also printed on AVS.

## 2018-10-29 NOTE — Assessment & Plan Note (Signed)
At goal, continue regimen. 

## 2018-10-30 ENCOUNTER — Ambulatory Visit (INDEPENDENT_AMBULATORY_CARE_PROVIDER_SITE_OTHER): Payer: BLUE CROSS/BLUE SHIELD | Admitting: Bariatrics

## 2018-10-30 ENCOUNTER — Encounter (INDEPENDENT_AMBULATORY_CARE_PROVIDER_SITE_OTHER): Payer: Self-pay | Admitting: Bariatrics

## 2018-10-30 VITALS — BP 142/84 | HR 68 | Temp 97.6°F | Ht 66.0 in | Wt 190.0 lb

## 2018-10-30 DIAGNOSIS — Z9189 Other specified personal risk factors, not elsewhere classified: Secondary | ICD-10-CM | POA: Diagnosis not present

## 2018-10-30 DIAGNOSIS — R5383 Other fatigue: Secondary | ICD-10-CM

## 2018-10-30 DIAGNOSIS — Z1331 Encounter for screening for depression: Secondary | ICD-10-CM | POA: Diagnosis not present

## 2018-10-30 DIAGNOSIS — E669 Obesity, unspecified: Secondary | ICD-10-CM

## 2018-10-30 DIAGNOSIS — I10 Essential (primary) hypertension: Secondary | ICD-10-CM | POA: Diagnosis not present

## 2018-10-30 DIAGNOSIS — E559 Vitamin D deficiency, unspecified: Secondary | ICD-10-CM | POA: Diagnosis not present

## 2018-10-30 DIAGNOSIS — R7309 Other abnormal glucose: Secondary | ICD-10-CM | POA: Diagnosis not present

## 2018-10-30 DIAGNOSIS — Z6830 Body mass index (BMI) 30.0-30.9, adult: Secondary | ICD-10-CM

## 2018-10-30 DIAGNOSIS — R0602 Shortness of breath: Secondary | ICD-10-CM | POA: Diagnosis not present

## 2018-10-30 DIAGNOSIS — Z0289 Encounter for other administrative examinations: Secondary | ICD-10-CM

## 2018-10-30 DIAGNOSIS — J45909 Unspecified asthma, uncomplicated: Secondary | ICD-10-CM

## 2018-10-31 LAB — LIPID PANEL WITH LDL/HDL RATIO
Cholesterol, Total: 225 mg/dL — ABNORMAL HIGH (ref 100–199)
HDL: 61 mg/dL (ref >39)
LDL Calculated: 142 mg/dL — ABNORMAL HIGH (ref 0–99)
LDl/HDL Ratio: 2.3 ratio (ref 0.0–3.2)
Triglycerides: 109 mg/dL (ref 0–149)
VLDL Cholesterol Cal: 22 mg/dL (ref 5–40)

## 2018-10-31 LAB — INSULIN, RANDOM: INSULIN: 18.3 u[IU]/mL (ref 2.6–24.9)

## 2018-10-31 LAB — VITAMIN D 25 HYDROXY (VIT D DEFICIENCY, FRACTURES): Vit D, 25-Hydroxy: 17.8 ng/mL — ABNORMAL LOW (ref 30.0–100.0)

## 2018-10-31 LAB — HEMOGLOBIN A1C
Est. average glucose Bld gHb Est-mCnc: 117 mg/dL
Hgb A1c MFr Bld: 5.7 % — ABNORMAL HIGH (ref 4.8–5.6)

## 2018-10-31 NOTE — Progress Notes (Signed)
Office: [phone]  /  Fax: [phone]   Dear Jill Atkinson. Jill Schwalbe, FNP,   Thank you for referring Jill Atkinson to our clinic. The following note includes my evaluation and treatment recommendations.  HPI:   Chief Complaint: OBESITY    Jill Atkinson has been referred by Jill Atkinson. Jill Schwalbe, FNP for consultation regarding her obesity and obesity related comorbidities.    Jill Atkinson (MR# [id number]) is a 53 y.o. female who presents on 10/31/2018 for obesity evaluation and treatment. Current BMI is Body mass index is 30.67 kg/m.Jill Atkinson has been struggling with her weight for many years and has been unsuccessful in either losing weight, maintaining weight loss, or reaching her healthy weight goal.     Jill Atkinson attended our information session and states she is currently in the action stage of change and ready to dedicate time achieving and maintaining a healthier weight. Jill Atkinson is interested in becoming our patient and working on intensive lifestyle modifications including (but not limited to) diet, exercise and weight loss.    Jill Atkinson states her family eats meals together she thinks her family will eat healthier with her  she does like to cook her desired weight loss is 50 lbs she started gaining weight in her 30's her heaviest weight ever was 190 lbs. she snacks frequently in the evenings she does frequently snack between meals she is frequently drinking liquids with calories she struggles with emotional eating    Fatigue Jill Atkinson feels her energy is lower than it should be. This has worsened with weight gain and has not worsened recently. Jill Atkinson admits to daytime somnolence and she admits to waking up still tired. Patient is at risk for obstructive sleep apnea. Patent has a history of symptoms of daytime fatigue, morning fatigue and hypertension. Patient generally gets 8 hours of sleep per night, and states they generally have restless sleep. Snoring is present.  Apneic episodes are not present. Epworth Sleepiness Score is 11  Dyspnea on exertion Jill Atkinson notes increasing shortness of breath with certain activities and seems to be worsening over time with weight gain. She notes getting out of breath sooner with activity than she used to. This has not gotten worse recently. Jill Atkinson has a history of asthma.  Jill Atkinson denies orthopnea.  Hypertension Jill Atkinson is a 53 y.o. female with hypertension. She is currently taking Lisinopril. Her blood pressure is running slightly high at 142/84. Jill Atkinson denies chest pain. She is working weight loss to help control her blood pressure with the goal of decreasing her risk of heart attack and stroke. Jill Atkinson blood pressure is not currently controlled.  At risk for cardiovascular disease Jill Atkinson is at a higher than average risk for cardiovascular disease due to obesity and hypertension. She currently denies any chest pain.  Asthma (exercise induced) Jill Atkinson has a history of exercise induced asthma and she uses her inhaler in extreme situations.  Elevated Glucose Jill Atkinson has a history of some elevated blood glucose readings without a diagnosis of diabetes. She denies polyphagia.  Vitamin D deficiency Jill Atkinson has a diagnosis of vitamin D deficiency. She is not currently taking vit D and denies nausea, vomiting or muscle weakness.  Depression Screen Jill Atkinson's Food and Mood (modified PHQ-9) score was  Depression screen PHQ 2/9 10/30/2018  Decreased Interest 1  Down, Depressed, Hopeless 2  PHQ - 2 Score 3  Altered sleeping 3  Tired, decreased energy 2  Change in appetite 1  Feeling bad or failure about yourself  3  Trouble concentrating 1  Moving slowly or fidgety/restless 0  Suicidal thoughts 0  PHQ-9 Score 13  Difficult doing work/chores Very difficult    ASSESSMENT AND PLAN:  Other fatigue - Plan: EKG 12-Lead  Shortness of breath on exertion - Plan: Lipid Panel With LDL/HDL  Ratio  Elevated glucose - Plan: Hemoglobin A1c, Insulin, random  Essential hypertension - Plan: Lipid Panel With LDL/HDL Ratio  Uncomplicated asthma, unspecified asthma severity, unspecified whether persistent  Vitamin D deficiency - Plan: VITAMIN D 25 Hydroxy (Vit-D Deficiency, Fractures)  Depression screening  At risk for heart disease  Class 1 obesity with serious comorbidity and body mass index (BMI) of 30.0 to 30.9 in adult, unspecified obesity type  PLAN:  Fatigue Jill Atkinson was informed that her fatigue may be related to obesity, depression or many other causes. Labs will be ordered, and in the meanwhile Jill Atkinson has agreed to work on diet, exercise and weight loss to help with fatigue. Proper sleep hygiene was discussed including the need for 7-8 hours of quality sleep each night. A sleep study was not ordered based on symptoms and Epworth score.  Dyspnea on exertion Jill Atkinson's shortness of breath appears to be obesity related and exercise induced. She has agreed to work on weight loss and gradually increase exercise to treat her exercise induced shortness of breath. If Jill Atkinson follows our instructions and loses weight without improvement of her shortness of breath, we will plan to refer to pulmonology. We will monitor this condition regularly. Jill Atkinson agrees to this plan.  Hypertension We discussed sodium restriction, working on healthy weight loss, and a regular exercise program as the means to achieve improved blood pressure control. Jill Atkinson agreed with this plan and agreed to follow up as directed. We will continue to monitor her blood pressure as well as her progress with the above lifestyle modifications. She will continue her medications as prescribed and will watch for signs of hypotension as she continues her lifestyle modifications.  Cardiovascular risk counseling Jill Atkinson was given extended (15 minutes) coronary artery disease prevention counseling today. She is 53  y.o. female and has risk factors for heart disease including obesity and hypertension. We discussed intensive lifestyle modifications today with an emphasis on specific weight loss instructions and strategies. Pt was also informed of the importance of increasing exercise and decreasing saturated fats to help prevent heart disease.  Asthma (exercise induced) Jill Atkinson will use her inhaler as needed and will follow up with our clinic in 2 weeks.  Elevated Glucose Fasting labs (Hgb A1c, insulin level) will be obtained and results with be discussed with Jill Atkinson in 2 weeks at her follow up visit. In the meanwhile Declynn was started on a lower simple carbohydrate diet and will work on weight loss efforts.  Vitamin D Deficiency Saryn was informed that low vitamin D levels contributes to fatigue and are associated with obesity, breast, and colon cancer. We will check vitamin D level and she will follow up for routine testing of vitamin D, at least 2-3 times per year.   Depression Screen Jill Atkinson had a moderately positive depression screening. Depression is commonly associated with obesity and often results in emotional eating behaviors. We will monitor this closely and work on CBT to help improve the non-hunger eating patterns. Referral to Psychology may be required if no improvement is seen as she continues in our clinic.  Obesity Jill Atkinson is currently in the action stage of change and her goal is to continue with weight loss efforts. I recommend Jill Atkinson begin  the structured treatment plan as follows:  She has agreed to follow the Category 3 plan Jill Atkinson has been instructed to eventually work up to a goal of 150 minutes of combined cardio and strengthening exercise per week for weight loss and overall health benefits. We discussed the following Behavioral Modification Strategies today: increase H2O intake, keeping healthy foods in the home, increasing lean protein intake, decreasing simple  carbohydrates, increasing vegetables and work on meal planning and easy cooking plans   She was informed of the importance of frequent follow up visits to maximize her success with intensive lifestyle modifications for her multiple health conditions. She was informed we would discuss her lab results at her next visit unless there is a critical issue that needs to be addressed sooner. Jill Atkinson agreed to keep her next visit at the agreed upon time to discuss these results.  ALLERGIES: Allergies  Allergen Reactions  . Bactrim [Sulfamethoxazole-Trimethoprim]     Lip swelling    MEDICATIONS: Current Outpatient Medications on File Prior to Visit  Medication Sig Dispense Refill  . albuterol (VENTOLIN HFA) 108 (90 Base) MCG/ACT inhaler Inhale 2 puffs into the lungs every 6 (six) hours as needed for wheezing. 18 g 3  . buPROPion (WELLBUTRIN XL) 300 MG 24 hr tablet Take 1 tablet (300 mg total) by mouth daily. 90 tablet 1  . cetirizine (ZYRTEC) 10 MG tablet Take 10 mg by mouth daily.    Marland Kitchen escitalopram (LEXAPRO) 10 MG tablet Take 1 tablet (10 mg total) by mouth daily for 30 doses. 90 tablet 3  . famotidine (PEPCID) 10 MG tablet Take 10 mg by mouth 2 (two) times daily.    . Fluticasone Propionate (FLONASE NA) Place into the nose.    . lansoprazole (PREVACID) 15 MG capsule Take 15 mg by mouth daily at 12 noon.    . Levocetirizine Dihydrochloride (XYZAL ALLERGY 24HR PO) Take by mouth daily.    Marland Kitchen lisinopril (PRINIVIL,ZESTRIL) 20 MG tablet Take 1 tablet (20 mg total) by mouth daily. 90 tablet 3   No current facility-administered medications on file prior to visit.     PAST MEDICAL HISTORY: Past Medical History:  Diagnosis Date  . Allergy    hay fever  . Anxiety   . Arrhythmia    AVNRT, s/p ablation Dr. Westley Gambles at [facility]  . Asthma    exercise induced  . Chicken pox   . Depression    post partum  . Food allergy   . GERD (gastroesophageal reflux disease)   . Hypertension   . IBS (irritable  bowel syndrome)   . Palpitations   . Reflux esophagitis   . Shingles   . Shortness of breath   . Swelling of both lower extremities   . UTI (lower urinary tract infection)     PAST SURGICAL HISTORY: Past Surgical History:  Procedure Laterality Date  . CARDIAC ELECTROPHYSIOLOGY STUDY AND ABLATION  2012   . COLONOSCOPY WITH PROPOFOL N/A 11/02/2015   Procedure: COLONOSCOPY WITH PROPOFOL;  Surgeon: Manya Silvas, MD;  Location: [facility] ENDOSCOPY;  Service: Endoscopy;  Laterality: N/A;  . [place]   post delivery  . NASAL SINUS SURGERY  2006  . TONSILLECTOMY  1978    SOCIAL HISTORY: Social History   Tobacco Use  . Smoking status: Never Smoker  . Smokeless tobacco: Never Used  Substance Use Topics  . Alcohol use: Yes    Alcohol/week: 2.0 standard drinks    Types: 2 Glasses of  wine per week    Comment: occasional, wine  . Drug use: No    FAMILY HISTORY: Family History  Problem Relation Age of Onset  . Arthritis Mother   . Hypertension Mother   . Thyroid disease Mother   . Arthritis Father   . Alcohol abuse Maternal Grandmother   . Drug abuse Maternal Grandmother   . Arthritis Maternal Grandmother   . Stroke Maternal Grandmother   . Hypertension Maternal Grandmother   . Alcohol abuse Maternal Grandfather   . Drug abuse Maternal Grandfather   . Arthritis Maternal Grandfather   . Hypertension Maternal Grandfather     ROS: Review of Systems  Constitutional: Positive for malaise/fatigue.  HENT: Positive for congestion (nasal stuffiness), sinus pain and tinnitus.        + Nasal Discharge + Hay Fever  Eyes:       + Wear Glasses or Contacts  Respiratory: Positive for cough and shortness of breath (on exertion).   Cardiovascular: Negative for chest pain and orthopnea.       + Very Cold Feet or Hands  Gastrointestinal: Positive for heartburn. Negative for nausea and vomiting.  Musculoskeletal:       Negative for muscle weakness    Endo/Heme/Allergies: Bruises/bleeds easily (bruising).       Negative for polyphagia  Psychiatric/Behavioral: Positive for depression.       + Stress    PHYSICAL EXAM: Blood pressure (!) 142/84, pulse 68, temperature 97.6 F (36.4 C), temperature source Oral, height 5\' 6"  (1.676 m), weight 190 lb (86.2 kg), last menstrual period 05/26/2016, SpO2 100 %. Body mass index is 30.67 kg/m. Physical Exam Vitals signs reviewed.  Constitutional:      Appearance: Normal appearance. She is well-developed. She is obese.  HENT:     Head: Normocephalic and atraumatic.     Nose: Nose normal.  Eyes:     General: No scleral icterus.    Extraocular Movements: Extraocular movements intact.  Neck:     Musculoskeletal: Normal range of motion and neck supple.     Thyroid: No thyromegaly.  Cardiovascular:     Rate and Rhythm: Normal rate and regular rhythm.  Pulmonary:     Effort: Pulmonary effort is normal. No respiratory distress.  Abdominal:     Palpations: Abdomen is soft.     Tenderness: There is no abdominal tenderness.  Musculoskeletal: Normal range of motion.     Comments: Range of Motion normal in all 4 extremities  Skin:    General: Skin is warm and dry.  Neurological:     Mental Status: She is alert and oriented to person, place, and time.     Coordination: Coordination normal.  Psychiatric:        Mood and Affect: Mood normal.        Behavior: Behavior normal.     RECENT LABS AND TESTS: BMET    Component Value Date/Time   NA 140 08/27/2018 1520   NA 139 08/02/2012 0954   K 4.1 08/27/2018 1520   CL 104 08/27/2018 1520   CO2 26 08/27/2018 1520   GLUCOSE 104 (H) 08/27/2018 1520   BUN 15 08/27/2018 1520   BUN 8 08/02/2012 0954   CREATININE 0.88 08/27/2018 1520   CALCIUM 9.6 08/27/2018 1520   GFRNONAA 103 08/02/2012 0954   GFRAA 118 08/02/2012 0954   Lab Results  Component Value Date   HGBA1C 5.7 (H) 10/30/2018   Lab Results  Component Value Date   INSULIN 18.3  10/30/2018  CBC    Component Value Date/Time   WBC 5.1 08/24/2017 0922   RBC 4.71 08/24/2017 0922   HGB 15.2 (H) 08/24/2017 0922   HCT 45.4 08/24/2017 0922   PLT 331.0 08/24/2017 0922   MCV 96.3 08/24/2017 0922   MCH 29.0 08/02/2012 0954   MCHC 33.5 08/24/2017 0922   RDW 12.8 08/24/2017 0922   RDW 14.4 08/02/2012 0954   LYMPHSABS 1.7 08/24/2017 0922   LYMPHSABS 1.5 08/02/2012 0954   MONOABS 0.5 08/24/2017 0922   EOSABS 0.1 08/24/2017 0922   EOSABS 0.1 08/02/2012 0954   BASOSABS 0.0 08/24/2017 0922   BASOSABS 0.0 08/02/2012 0954   Iron/TIBC/Ferritin/ %Sat No results found for: IRON, TIBC, FERRITIN, IRONPCTSAT Lipid Panel     Component Value Date/Time   CHOL 225 (H) 10/30/2018 0901   TRIG 109 10/30/2018 0901   HDL 61 10/30/2018 0901   CHOLHDL 4 08/24/2017 0922   VLDL 21.8 08/24/2017 0922   LDLCALC 142 (H) 10/30/2018 0901   LDLDIRECT 142.3 08/06/2013 0911   Hepatic Function Panel     Component Value Date/Time   PROT 6.9 08/27/2018 1520   PROT 6.8 08/02/2012 0954   ALBUMIN 4.5 08/27/2018 1520   ALBUMIN 4.4 08/02/2012 0954   AST 19 08/27/2018 1520   ALT 23 08/27/2018 1520   ALKPHOS 76 08/27/2018 1520   BILITOT 0.4 08/27/2018 1520      Component Value Date/Time   TSH 2.15 08/27/2018 1520   TSH 1.16 08/24/2017 0922   TSH 1.85 08/11/2015 0909    Ref. Range 08/06/2013 09:11  Vitamin D, 25-Hydroxy Latest Ref Range: 30 - 89 ng/mL 59    ECG  shows NSR with a rate of 73 BPM INDIRECT CALORIMETER done today shows a VO2 of 317 and a REE of 2206.  Her calculated basal metabolic rate is 8003 thus her basal metabolic rate is better than expected.       OBESITY BEHAVIORAL INTERVENTION VISIT  Today's visit was # 1   Starting weight: 190 lbs Starting date: 10/30/2017 Today's weight : 190 lbs  Today's date: 10/30/2018 Total lbs lost to date: 0  ASK: We discussed the diagnosis of obesity with Jill Atkinson today and Jill Atkinson agreed to give [place] permission to  discuss obesity behavioral modification therapy today.  ASSESS: Mikaylah has the diagnosis of obesity and her BMI today is 30.68 Marka is in the action stage of change   ADVISE: Loretta was educated on the multiple health risks of obesity as well as the benefit of weight loss to improve her health. She was advised of the need for long term treatment and the importance of lifestyle modifications to improve her current health and to decrease her risk of future health problems.  AGREE: Multiple dietary modification options and treatment options were discussed and  Toy agreed to follow the recommendations documented in the above note.  ARRANGE: Ardyn was educated on the importance of frequent visits to treat obesity as outlined per CMS and USPSTF guidelines and agreed to schedule her next follow up appointment today.  Corey Skains, am acting as Location manager for [employer]. Owens Shark, DO  I have reviewed the above documentation for accuracy and completeness, and I agree with the above. -Jearld Lesch, DO

## 2018-11-01 ENCOUNTER — Encounter (INDEPENDENT_AMBULATORY_CARE_PROVIDER_SITE_OTHER): Payer: Self-pay | Admitting: Bariatrics

## 2018-11-01 DIAGNOSIS — Z6830 Body mass index (BMI) 30.0-30.9, adult: Secondary | ICD-10-CM

## 2018-11-01 DIAGNOSIS — E66811 Obesity, class 1: Secondary | ICD-10-CM | POA: Insufficient documentation

## 2018-11-01 DIAGNOSIS — E669 Obesity, unspecified: Secondary | ICD-10-CM | POA: Insufficient documentation

## 2018-11-13 ENCOUNTER — Ambulatory Visit (INDEPENDENT_AMBULATORY_CARE_PROVIDER_SITE_OTHER): Payer: BLUE CROSS/BLUE SHIELD | Admitting: Bariatrics

## 2018-11-13 ENCOUNTER — Encounter (INDEPENDENT_AMBULATORY_CARE_PROVIDER_SITE_OTHER): Payer: Self-pay | Admitting: Bariatrics

## 2018-11-13 VITALS — BP 132/85 | HR 75 | Temp 97.6°F | Ht 66.0 in | Wt 186.0 lb

## 2018-11-13 DIAGNOSIS — E559 Vitamin D deficiency, unspecified: Secondary | ICD-10-CM | POA: Diagnosis not present

## 2018-11-13 DIAGNOSIS — E669 Obesity, unspecified: Secondary | ICD-10-CM

## 2018-11-13 DIAGNOSIS — R7303 Prediabetes: Secondary | ICD-10-CM | POA: Diagnosis not present

## 2018-11-13 DIAGNOSIS — Z9189 Other specified personal risk factors, not elsewhere classified: Secondary | ICD-10-CM | POA: Diagnosis not present

## 2018-11-13 DIAGNOSIS — I10 Essential (primary) hypertension: Secondary | ICD-10-CM

## 2018-11-13 DIAGNOSIS — Z6830 Body mass index (BMI) 30.0-30.9, adult: Secondary | ICD-10-CM

## 2018-11-13 MED ORDER — VITAMIN D (ERGOCALCIFEROL) 1.25 MG (50000 UNIT) PO CAPS: 50000.0000 [IU] | ORAL_CAPSULE | ORAL | 0 refills | Status: DC

## 2018-11-14 DIAGNOSIS — R7303 Prediabetes: Secondary | ICD-10-CM | POA: Insufficient documentation

## 2018-11-14 NOTE — Progress Notes (Signed)
Office: [phone]  /  Fax: [phone]   HPI:   Chief Complaint: OBESITY Jill Atkinson is here to discuss her progress with her obesity treatment plan. She is on the Category 3 plan and is following her eating plan approximately 90 % of the time. She states she is rowing for 10 minutes 4 times per week. Jill Atkinson has done well over the last two week. "The diet was boring" (looking forward to frozen dinners). She cannot eat "cottage cheese". Jill Atkinson did not have inappropriate hunger and she has no cravings. Jill Atkinson decreased diet soda. Her weight is 186 lb (84.4 kg) today and has had a weight loss of 4 pounds over a period of 2 weeks since her last visit. She has lost 4 lbs since starting treatment with [place].  Vitamin D deficiency Jill Atkinson has a diagnosis of vitamin D deficiency. Her last vitamin D level was at 17.8 She is not currently taking vit D and denies nausea, vomiting or muscle weakness.  At risk for osteopenia and osteoporosis Jill Atkinson is at higher risk of osteopenia and osteoporosis due to vitamin D deficiency.   Hypertension Jill Atkinson is a 53 y.o. female with hypertension. She is currently taking Lisinopril. Jill Atkinson denies chest pain or shortness of breath on exertion. She is working weight loss to help control her blood pressure with the goal of decreasing her risk of heart attack and stroke. Jill Atkinson blood pressure is reasonably well controlled.  Pre-Diabetes Jill Atkinson has a diagnosis of prediabetes based on her elevated Hgb A1c and was informed this puts her at greater risk of developing diabetes. Her last A1c was at 5.7 and last insulin level was at 18.3 She is not taking medications currently and continues to work on diet and exercise to decrease risk of diabetes. She denies polyphagia.  ASSESSMENT AND PLAN:  Vitamin D deficiency - Plan: Vitamin D, Ergocalciferol, (DRISDOL) 1.25 MG (50000 UT) CAPS capsule  Essential hypertension  Prediabetes  At risk  for osteoporosis  Class 1 obesity with serious comorbidity and body mass index (BMI) of 30.0 to 30.9 in adult, unspecified obesity type  PLAN:  Vitamin D Deficiency Jill Atkinson was informed that low vitamin D levels contributes to fatigue and are associated with obesity, breast, and colon cancer. She agrees to start prescription Vit D @50 ,000 IU every week #4 with no refills and will follow up for routine testing of vitamin D, at least 2-3 times per year. She was informed of the risk of over-replacement of vitamin D and agrees to not increase her dose unless she discusses this with [place] first. Jill Atkinson agrees to follow up as directed.  At risk for osteopenia and osteoporosis Jill Atkinson was given extended  (15 minutes) osteoporosis prevention counseling today. Jill Atkinson is at risk for osteopenia and osteoporosis due to her vitamin D deficiency. She was encouraged to take her vitamin D and follow her higher calcium diet and increase strengthening exercise to help strengthen her bones and decrease her risk of osteopenia and osteoporosis.  Hypertension We discussed sodium restriction, working on healthy weight loss, and a regular exercise program as the means to achieve improved blood pressure control. Jill Atkinson agreed with this plan and agreed to follow up as directed. We will continue to monitor her blood pressure as well as her progress with the above lifestyle modifications. She will continue her medications as prescribed and will watch for signs of hypotension as she continues her lifestyle modifications.  Pre-Diabetes Jill Atkinson will continue to work on weight loss, exercise,  increasing lean protein and decreasing simple carbohydrates in her diet to help decrease the risk of diabetes. We dicussed the importance of weight loss today.Marland Kitchen She was informed that eating too many simple carbohydrates or too many calories at one sitting increases the likelihood of GI side effects. Jill Atkinson agreed to follow up with [place]  as directed to monitor her progress.  Obesity Jill Atkinson is currently in the action stage of change. As such, her goal is to continue with weight loss efforts She has agreed to follow the Category 3 plan Jill Atkinson has been instructed to work up to a goal of 150 minutes of combined cardio and strengthening exercise per week for weight loss and overall health benefits. We discussed the following Behavioral Modification Strategies today: increase H2O intake, keeping healthy foods in the home, planning for success, increasing lean protein intake and she will spread her protein out during the day, decreasing simple carbohydrates, increasing vegetables and work on meal planning and easy cooking plans  Jill Atkinson has agreed to follow up with our clinic in 2 weeks. She was informed of the importance of frequent follow up visits to maximize her success with intensive lifestyle modifications for her multiple health conditions.  ALLERGIES: Allergies  Allergen Reactions  . Bactrim [Sulfamethoxazole-Trimethoprim]     Lip swelling    MEDICATIONS: Current Outpatient Medications on File Prior to Visit  Medication Sig Dispense Refill  . albuterol (VENTOLIN HFA) 108 (90 Base) MCG/ACT inhaler Inhale 2 puffs into the lungs every 6 (six) hours as needed for wheezing. 18 g 3  . buPROPion (WELLBUTRIN XL) 300 MG 24 hr tablet Take 1 tablet (300 mg total) by mouth daily. 90 tablet 1  . cetirizine (ZYRTEC) 10 MG tablet Take 10 mg by mouth daily.    Marland Kitchen escitalopram (LEXAPRO) 10 MG tablet Take 1 tablet (10 mg total) by mouth daily for 30 doses. 90 tablet 3  . famotidine (PEPCID) 10 MG tablet Take 10 mg by mouth 2 (two) times daily.    . Fluticasone Propionate (FLONASE NA) Place into the nose.    . lansoprazole (PREVACID) 15 MG capsule Take 15 mg by mouth daily at 12 noon.    . Levocetirizine Dihydrochloride (XYZAL ALLERGY 24HR PO) Take by mouth daily.    Marland Kitchen lisinopril (PRINIVIL,ZESTRIL) 20 MG tablet Take 1 tablet (20 mg  total) by mouth daily. 90 tablet 3   No current facility-administered medications on file prior to visit.     PAST MEDICAL HISTORY: Past Medical History:  Diagnosis Date  . Allergy    hay fever  . Anxiety   . Arrhythmia    AVNRT, s/p ablation Dr. Westley Gambles at [facility]  . Asthma    exercise induced  . Chicken pox   . Depression    post partum  . Food allergy   . GERD (gastroesophageal reflux disease)   . Hypertension   . IBS (irritable bowel syndrome)   . Palpitations   . Reflux esophagitis   . Shingles   . Shortness of breath   . Swelling of both lower extremities   . UTI (lower urinary tract infection)     PAST SURGICAL HISTORY: Past Surgical History:  Procedure Laterality Date  . CARDIAC ELECTROPHYSIOLOGY STUDY AND ABLATION  2012   . COLONOSCOPY WITH PROPOFOL N/A 11/02/2015   Procedure: COLONOSCOPY WITH PROPOFOL;  Surgeon: Manya Silvas, MD;  Location: [facility] ENDOSCOPY;  Service: Endoscopy;  Laterality: N/A;  . [place]   post delivery  .  NASAL SINUS SURGERY  2006  . TONSILLECTOMY  1978    SOCIAL HISTORY: Social History   Tobacco Use  . Smoking status: Never Smoker  . Smokeless tobacco: Never Used  Substance Use Topics  . Alcohol use: Yes    Alcohol/week: 2.0 standard drinks    Types: 2 Glasses of wine per week    Comment: occasional, wine  . Drug use: No    FAMILY HISTORY: Family History  Problem Relation Age of Onset  . Arthritis Mother   . Hypertension Mother   . Thyroid disease Mother   . Arthritis Father   . Alcohol abuse Maternal Grandmother   . Drug abuse Maternal Grandmother   . Arthritis Maternal Grandmother   . Stroke Maternal Grandmother   . Hypertension Maternal Grandmother   . Alcohol abuse Maternal Grandfather   . Drug abuse Maternal Grandfather   . Arthritis Maternal Grandfather   . Hypertension Maternal Grandfather     ROS: Review of Systems  Constitutional: Positive for weight loss.    Gastrointestinal: Negative for nausea and vomiting.  Musculoskeletal:       Negative for muscle weakness  Endo/Heme/Allergies:       Negative for polyphagia    PHYSICAL EXAM: Blood pressure 132/85, pulse 75, temperature 97.6 F (36.4 C), temperature source Oral, height 5\' 6"  (1.676 m), weight 186 lb (84.4 kg), last menstrual period 05/26/2016, SpO2 97 %. Body mass index is 30.02 kg/m. Physical Exam Vitals signs reviewed.  Constitutional:      Appearance: Normal appearance. She is well-developed. She is obese.  Cardiovascular:     Rate and Rhythm: Normal rate.  Pulmonary:     Effort: Pulmonary effort is normal.  Musculoskeletal: Normal range of motion.  Skin:    General: Skin is warm and dry.  Neurological:     Mental Status: She is alert and oriented to person, place, and time.  Psychiatric:        Mood and Affect: Mood normal.        Behavior: Behavior normal.     RECENT LABS AND TESTS: BMET    Component Value Date/Time   NA 140 08/27/2018 1520   NA 139 08/02/2012 0954   K 4.1 08/27/2018 1520   CL 104 08/27/2018 1520   CO2 26 08/27/2018 1520   GLUCOSE 104 (H) 08/27/2018 1520   BUN 15 08/27/2018 1520   BUN 8 08/02/2012 0954   CREATININE 0.88 08/27/2018 1520   CALCIUM 9.6 08/27/2018 1520   GFRNONAA 103 08/02/2012 0954   GFRAA 118 08/02/2012 0954   Lab Results  Component Value Date   HGBA1C 5.7 (H) 10/30/2018   HGBA1C 5.3 08/24/2017   HGBA1C 5.5 08/11/2015   Lab Results  Component Value Date   INSULIN 18.3 10/30/2018   CBC    Component Value Date/Time   WBC 5.1 08/24/2017 0922   RBC 4.71 08/24/2017 0922   HGB 15.2 (H) 08/24/2017 0922   HCT 45.4 08/24/2017 0922   PLT 331.0 08/24/2017 0922   MCV 96.3 08/24/2017 0922   MCH 29.0 08/02/2012 0954   MCHC 33.5 08/24/2017 0922   RDW 12.8 08/24/2017 0922   RDW 14.4 08/02/2012 0954   LYMPHSABS 1.7 08/24/2017 0922   LYMPHSABS 1.5 08/02/2012 0954   MONOABS 0.5 08/24/2017 0922   EOSABS 0.1 08/24/2017 0922    EOSABS 0.1 08/02/2012 0954   BASOSABS 0.0 08/24/2017 0922   BASOSABS 0.0 08/02/2012 0954   Iron/TIBC/Ferritin/ %Sat No results found for: IRON, TIBC, FERRITIN, IRONPCTSAT  Lipid Panel     Component Value Date/Time   CHOL 225 (H) 10/30/2018 0901   TRIG 109 10/30/2018 0901   HDL 61 10/30/2018 0901   CHOLHDL 4 08/24/2017 0922   VLDL 21.8 08/24/2017 0922   LDLCALC 142 (H) 10/30/2018 0901   LDLDIRECT 142.3 08/06/2013 0911   Hepatic Function Panel     Component Value Date/Time   PROT 6.9 08/27/2018 1520   PROT 6.8 08/02/2012 0954   ALBUMIN 4.5 08/27/2018 1520   ALBUMIN 4.4 08/02/2012 0954   AST 19 08/27/2018 1520   ALT 23 08/27/2018 1520   ALKPHOS 76 08/27/2018 1520   BILITOT 0.4 08/27/2018 1520      Component Value Date/Time   TSH 2.15 08/27/2018 1520   TSH 1.16 08/24/2017 0922   TSH 1.85 08/11/2015 0909      OBESITY BEHAVIORAL INTERVENTION VISIT  Today's visit was # 2   Starting weight: 190 lbs Starting date: 10/30/2017 Today's weight : 186 lbs  Today's date: 11/13/2018 Total lbs lost to date: 4   ASK: We discussed the diagnosis of obesity with Jill Atkinson today and Sharyn Lull agreed to give [place] permission to discuss obesity behavioral modification therapy today.  ASSESS: Pearlie has the diagnosis of obesity and her BMI today is 30.04 Nick is in the action stage of change   ADVISE: Nyssa was educated on the multiple health risks of obesity as well as the benefit of weight loss to improve her health. She was advised of the need for long term treatment and the importance of lifestyle modifications to improve her current health and to decrease her risk of future health problems.  AGREE: Multiple dietary modification options and treatment options were discussed and  Charlye agreed to follow the recommendations documented in the above note.  ARRANGE: Tahari was educated on the importance of frequent visits to treat obesity as outlined per CMS and  USPSTF guidelines and agreed to schedule her next follow up appointment today.  Corey Skains, am acting as Location manager for [employer]. Owens Shark, DO  I have reviewed the above documentation for accuracy and completeness, and I agree with the above. -Jearld Lesch, DO

## 2018-11-15 ENCOUNTER — Other Ambulatory Visit: Payer: Self-pay | Admitting: Family

## 2018-11-15 DIAGNOSIS — I10 Essential (primary) hypertension: Secondary | ICD-10-CM

## 2018-11-28 ENCOUNTER — Encounter (INDEPENDENT_AMBULATORY_CARE_PROVIDER_SITE_OTHER): Payer: Self-pay | Admitting: Bariatrics

## 2018-11-28 ENCOUNTER — Ambulatory Visit (INDEPENDENT_AMBULATORY_CARE_PROVIDER_SITE_OTHER): Payer: BLUE CROSS/BLUE SHIELD | Admitting: Bariatrics

## 2018-11-28 VITALS — BP 131/85 | HR 87 | Temp 97.8°F | Ht 66.0 in | Wt 184.0 lb

## 2018-11-28 DIAGNOSIS — E669 Obesity, unspecified: Secondary | ICD-10-CM

## 2018-11-28 DIAGNOSIS — I10 Essential (primary) hypertension: Secondary | ICD-10-CM | POA: Diagnosis not present

## 2018-11-28 DIAGNOSIS — R7303 Prediabetes: Secondary | ICD-10-CM | POA: Diagnosis not present

## 2018-11-28 DIAGNOSIS — Z9189 Other specified personal risk factors, not elsewhere classified: Secondary | ICD-10-CM

## 2018-11-28 DIAGNOSIS — E559 Vitamin D deficiency, unspecified: Secondary | ICD-10-CM

## 2018-11-28 DIAGNOSIS — Z6830 Body mass index (BMI) 30.0-30.9, adult: Secondary | ICD-10-CM

## 2018-11-28 MED ORDER — VITAMIN D (ERGOCALCIFEROL) 1.25 MG (50000 UNIT) PO CAPS: 50000.0000 [IU] | ORAL_CAPSULE | ORAL | 0 refills | Status: DC

## 2018-11-29 NOTE — Progress Notes (Signed)
Office: [phone]  /  Fax: [phone]   HPI:   Chief Complaint: OBESITY Jill Atkinson is here to discuss her progress with her obesity treatment plan. She is on the Category 3 plan and is following her eating plan approximately 85 to 90 % of the time. She states she is walking and rowing 10 minutes 2 times per week. Jill Atkinson is doing well overall. Her weight is 184 lb (83.5 kg) today and has had a weight loss of 2 pounds over a period of 2 weeks since her last visit. She has lost 6 lbs since starting treatment with [place].  Vitamin D deficiency Jill Atkinson has a diagnosis of vitamin D deficiency. She is currently taking high dose vit D and denies nausea, vomiting or muscle weakness.  At risk for osteopenia and osteoporosis Jill Atkinson is at higher risk of osteopenia and osteoporosis due to vitamin D deficiency.   Pre-Diabetes Jill Atkinson has a diagnosis of prediabetes based on her elevated Hgb A1c and was informed this puts her at greater risk of developing diabetes. She is not taking medications currently and continues to work on diet and exercise to decrease risk of diabetes. She denies polyphagia.  Hypertension Jill Atkinson is a 53 y.o. female with hypertension. She is taking Lisinopril. Jill Atkinson denies chest pain. She is working weight loss to help control her blood pressure with the goal of decreasing her risk of heart attack and stroke. Jill Atkinson blood pressure is well controlled.  ASSESSMENT AND PLAN:  Vitamin D deficiency - Plan: Vitamin D, Ergocalciferol, (DRISDOL) 1.25 MG (50000 UT) CAPS capsule  Prediabetes  Essential hypertension  At risk for osteoporosis  Class 1 obesity with serious comorbidity and body mass index (BMI) of 30.0 to 30.9 in adult, unspecified obesity type - Starting BMI greater then 30  PLAN:  Vitamin D Deficiency Jill Atkinson was informed that low vitamin D levels contributes to fatigue and are associated with obesity, breast, and colon cancer. She  agrees to continue to take prescription Vit D @50 ,000 IU every week #4 with no refills and will follow up for routine testing of vitamin D, at least 2-3 times per year. She was informed of the risk of over-replacement of vitamin D and agrees to not increase her dose unless she discusses this with [place] first. [name] agrees to follow up as directed.  At risk for osteopenia and osteoporosis Jill Atkinson was given extended  (15 minutes) osteoporosis prevention counseling today. Alicha is at risk for osteopenia and osteoporosis due to her vitamin D deficiency. She was encouraged to take her vitamin D and follow her higher calcium diet and increase strengthening exercise to help strengthen her bones and decrease her risk of osteopenia and osteoporosis.  Pre-Diabetes Jill Atkinson will continue to work on weight loss, exercise, increasing lean protein and decreasing simple carbohydrates in her diet to help decrease the risk of diabetes. She was informed that eating too many simple carbohydrates or too many calories at one sitting increases the likelihood of GI side effects. Jill Atkinson agreed to follow up with [place] as directed to monitor her progress.  Hypertension We discussed sodium restriction, working on healthy weight loss, and a regular exercise program as the means to achieve improved blood pressure control. Jill Atkinson agreed with this plan and agreed to follow up as directed. We will continue to monitor her blood pressure as well as her progress with the above lifestyle modifications. She will continue her medications as prescribed and will watch for signs of hypotension as she continues  her lifestyle modifications.  Obesity Jill Atkinson is currently in the action stage of change. As such, her goal is to continue with weight loss efforts She has agreed to follow the Category 3 plan Jill Atkinson has been instructed to work up to a goal of 150 minutes of combined cardio and strengthening exercise per week for weight loss  and overall health benefits. We discussed the following Behavioral Modification Strategies today: increase H2O intake, no skipping meals, increasing lean protein intake, decreasing simple carbohydrates, increasing vegetables and work on meal planning and easy cooking plans Additional lunch options were provided to patient today. Jill Atkinson can move her protein around.  Jill Atkinson has agreed to follow up with our clinic in 2 weeks. She was informed of the importance of frequent follow up visits to maximize her success with intensive lifestyle modifications for her multiple health conditions.  ALLERGIES: Allergies  Allergen Reactions  . Bactrim [Sulfamethoxazole-Trimethoprim]     Lip swelling    MEDICATIONS: Current Outpatient Medications on File Prior to Visit  Medication Sig Dispense Refill  . albuterol (VENTOLIN HFA) 108 (90 Base) MCG/ACT inhaler Inhale 2 puffs into the lungs every 6 (six) hours as needed for wheezing. 18 g 3  . buPROPion (WELLBUTRIN XL) 300 MG 24 hr tablet Take 1 tablet (300 mg total) by mouth daily. 90 tablet 1  . cetirizine (ZYRTEC) 10 MG tablet Take 10 mg by mouth daily.    Marland Kitchen escitalopram (LEXAPRO) 10 MG tablet Take 1 tablet (10 mg total) by mouth daily for 30 doses. 90 tablet 3  . famotidine (PEPCID) 10 MG tablet Take 10 mg by mouth 2 (two) times daily.    . Fluticasone Propionate (FLONASE NA) Place into the nose.    . lansoprazole (PREVACID) 15 MG capsule Take 15 mg by mouth daily at 12 noon.    . Levocetirizine Dihydrochloride (XYZAL ALLERGY 24HR PO) Take by mouth daily.    Marland Kitchen lisinopril (PRINIVIL,ZESTRIL) 10 MG tablet TAKE 2 TABLETS BY MOUTH EVERY DAY 60 tablet 0  . lisinopril (PRINIVIL,ZESTRIL) 20 MG tablet Take 1 tablet (20 mg total) by mouth daily. 90 tablet 3   No current facility-administered medications on file prior to visit.     PAST MEDICAL HISTORY: Past Medical History:  Diagnosis Date  . Allergy    hay fever  . Anxiety   . Arrhythmia    AVNRT, s/p  ablation Dr. Westley Gambles at [facility]  . Asthma    exercise induced  . Chicken pox   . Depression    post partum  . Food allergy   . GERD (gastroesophageal reflux disease)   . Hypertension   . IBS (irritable bowel syndrome)   . Palpitations   . Reflux esophagitis   . Shingles   . Shortness of breath   . Swelling of both lower extremities   . UTI (lower urinary tract infection)     PAST SURGICAL HISTORY: Past Surgical History:  Procedure Laterality Date  . CARDIAC ELECTROPHYSIOLOGY STUDY AND ABLATION  2012   . COLONOSCOPY WITH PROPOFOL N/A 11/02/2015   Procedure: COLONOSCOPY WITH PROPOFOL;  Surgeon: Manya Silvas, MD;  Location: [facility] ENDOSCOPY;  Service: Endoscopy;  Laterality: N/A;  . [place]   post delivery  . NASAL SINUS SURGERY  2006  . TONSILLECTOMY  1978    SOCIAL HISTORY: Social History   Tobacco Use  . Smoking status: Never Smoker  . Smokeless tobacco: Never Used  Substance Use Topics  . Alcohol use:  Yes    Alcohol/week: 2.0 standard drinks    Types: 2 Glasses of wine per week    Comment: occasional, wine  . Drug use: No    FAMILY HISTORY: Family History  Problem Relation Age of Onset  . Arthritis Mother   . Hypertension Mother   . Thyroid disease Mother   . Arthritis Father   . Alcohol abuse Maternal Grandmother   . Drug abuse Maternal Grandmother   . Arthritis Maternal Grandmother   . Stroke Maternal Grandmother   . Hypertension Maternal Grandmother   . Alcohol abuse Maternal Grandfather   . Drug abuse Maternal Grandfather   . Arthritis Maternal Grandfather   . Hypertension Maternal Grandfather     ROS: Review of Systems  Constitutional: Positive for weight loss.  Cardiovascular: Negative for chest pain.  Gastrointestinal: Negative for nausea and vomiting.  Musculoskeletal:       Negative for muscle weakness  Endo/Heme/Allergies:       Negative for polyphagia    PHYSICAL EXAM: Blood pressure 131/85, pulse 87,  temperature 97.8 F (36.6 C), temperature source Oral, height 5\' 6"  (1.676 m), weight 184 lb (83.5 kg), last menstrual period 05/26/2016, SpO2 98 %. Body mass index is 29.7 kg/m. Physical Exam Vitals signs reviewed.  Constitutional:      Appearance: Normal appearance. She is well-developed. She is obese.  Cardiovascular:     Rate and Rhythm: Normal rate.  Pulmonary:     Effort: Pulmonary effort is normal.  Musculoskeletal: Normal range of motion.  Skin:    General: Skin is warm and dry.  Neurological:     Mental Status: She is alert and oriented to person, place, and time.  Psychiatric:        Mood and Affect: Mood normal.        Behavior: Behavior normal.     RECENT LABS AND TESTS: BMET    Component Value Date/Time   NA 140 08/27/2018 1520   NA 139 08/02/2012 0954   K 4.1 08/27/2018 1520   CL 104 08/27/2018 1520   CO2 26 08/27/2018 1520   GLUCOSE 104 (H) 08/27/2018 1520   BUN 15 08/27/2018 1520   BUN 8 08/02/2012 0954   CREATININE 0.88 08/27/2018 1520   CALCIUM 9.6 08/27/2018 1520   GFRNONAA 103 08/02/2012 0954   GFRAA 118 08/02/2012 0954   Lab Results  Component Value Date   HGBA1C 5.7 (H) 10/30/2018   HGBA1C 5.3 08/24/2017   HGBA1C 5.5 08/11/2015   Lab Results  Component Value Date   INSULIN 18.3 10/30/2018   CBC    Component Value Date/Time   WBC 5.1 08/24/2017 0922   RBC 4.71 08/24/2017 0922   HGB 15.2 (H) 08/24/2017 0922   HCT 45.4 08/24/2017 0922   PLT 331.0 08/24/2017 0922   MCV 96.3 08/24/2017 0922   MCH 29.0 08/02/2012 0954   MCHC 33.5 08/24/2017 0922   RDW 12.8 08/24/2017 0922   RDW 14.4 08/02/2012 0954   LYMPHSABS 1.7 08/24/2017 0922   LYMPHSABS 1.5 08/02/2012 0954   MONOABS 0.5 08/24/2017 0922   EOSABS 0.1 08/24/2017 0922   EOSABS 0.1 08/02/2012 0954   BASOSABS 0.0 08/24/2017 0922   BASOSABS 0.0 08/02/2012 0954   Iron/TIBC/Ferritin/ %Sat No results found for: IRON, TIBC, FERRITIN, IRONPCTSAT Lipid Panel     Component Value  Date/Time   CHOL 225 (H) 10/30/2018 0901   TRIG 109 10/30/2018 0901   HDL 61 10/30/2018 0901   CHOLHDL 4 08/24/2017 7902  VLDL 21.8 08/24/2017 0922   LDLCALC 142 (H) 10/30/2018 0901   LDLDIRECT 142.3 08/06/2013 0911   Hepatic Function Panel     Component Value Date/Time   PROT 6.9 08/27/2018 1520   PROT 6.8 08/02/2012 0954   ALBUMIN 4.5 08/27/2018 1520   ALBUMIN 4.4 08/02/2012 0954   AST 19 08/27/2018 1520   ALT 23 08/27/2018 1520   ALKPHOS 76 08/27/2018 1520   BILITOT 0.4 08/27/2018 1520      Component Value Date/Time   TSH 2.15 08/27/2018 1520   TSH 1.16 08/24/2017 0922   TSH 1.85 08/11/2015 0909     Ref. Range 10/30/2018 09:01  Vitamin D, 25-Hydroxy Latest Ref Range: 30.0 - 100.0 ng/mL 17.8 (L)     OBESITY BEHAVIORAL INTERVENTION VISIT  Today's visit was # 3   Starting weight: 190 lbs Starting date: 10/30/2018 Today's weight : 184 lbs  Today's date: 11/28/2018 Total lbs lost to date: 6   ASK: We discussed the diagnosis of obesity with Jill Atkinson today and Jill Atkinson agreed to give [place] permission to discuss obesity behavioral modification therapy today.  ASSESS: Jill Atkinson has the diagnosis of obesity and her BMI today is 29.71 Jill Atkinson is in the action stage of change   ADVISE: Jill Atkinson was educated on the multiple health risks of obesity as well as the benefit of weight loss to improve her health. She was advised of the need for long term treatment and the importance of lifestyle modifications to improve her current health and to decrease her risk of future health problems.  AGREE: Multiple dietary modification options and treatment options were discussed and  Jill Atkinson agreed to follow the recommendations documented in the above note.  ARRANGE: Jill Atkinson was educated on the importance of frequent visits to treat obesity as outlined per CMS and USPSTF guidelines and agreed to schedule her next follow up appointment today.  Corey Skains, am acting as  Location manager for [employer]. Owens Shark, DO  I have reviewed the above documentation for accuracy and completeness, and I agree with the above. -Jearld Lesch, DO

## 2018-12-05 ENCOUNTER — Other Ambulatory Visit (INDEPENDENT_AMBULATORY_CARE_PROVIDER_SITE_OTHER): Payer: Self-pay | Admitting: Bariatrics

## 2018-12-05 DIAGNOSIS — E559 Vitamin D deficiency, unspecified: Secondary | ICD-10-CM

## 2018-12-06 ENCOUNTER — Encounter: Payer: Self-pay | Admitting: Obstetrics and Gynecology

## 2018-12-12 ENCOUNTER — Encounter (INDEPENDENT_AMBULATORY_CARE_PROVIDER_SITE_OTHER): Payer: Self-pay | Admitting: Bariatrics

## 2018-12-12 ENCOUNTER — Ambulatory Visit (INDEPENDENT_AMBULATORY_CARE_PROVIDER_SITE_OTHER): Payer: BLUE CROSS/BLUE SHIELD | Admitting: Bariatrics

## 2018-12-12 VITALS — BP 134/79 | HR 70 | Temp 97.8°F | Ht 66.0 in | Wt 184.0 lb

## 2018-12-12 DIAGNOSIS — Z6830 Body mass index (BMI) 30.0-30.9, adult: Secondary | ICD-10-CM | POA: Diagnosis not present

## 2018-12-12 DIAGNOSIS — E559 Vitamin D deficiency, unspecified: Secondary | ICD-10-CM

## 2018-12-12 DIAGNOSIS — R7303 Prediabetes: Secondary | ICD-10-CM

## 2018-12-12 DIAGNOSIS — E669 Obesity, unspecified: Secondary | ICD-10-CM

## 2018-12-13 NOTE — Progress Notes (Signed)
Office: [phone]  /  Fax: [phone]   HPI:   Chief Complaint: OBESITY Jill Atkinson is here to discuss her progress with her obesity treatment plan. She is on the Category 3 plan and is following her eating plan approximately 80 % of the time. She states she is rowing for 12 minutes 2 times per week and taking dance lessons 1 time per week. Jill Atkinson has been doing well overall. She has shifted some of her protein around (no significant changes per patient). She is up 1.4 pound in water weight. Her weight is 184 lb (83.5 kg) today and she has maintained weight over a period of 2 weeks since her last visit. She has lost 6 lbs since starting treatment with [place].  Vitamin D deficiency Jill Atkinson has a diagnosis of vitamin D deficiency. She is currently taking high dose vit D and denies nausea, vomiting or muscle weakness.  Pre-Diabetes [name] has a diagnosis of prediabetes based on her elevated Hgb A1c and was informed this puts her at greater risk of developing diabetes. She is not taking metformin currently and continues to work on diet and exercise to decrease risk of diabetes. She denies hypoglycemia.  ASSESSMENT AND PLAN:  Vitamin D deficiency  Prediabetes  Class 1 obesity with serious comorbidity and body mass index (BMI) of 30.0 to 30.9 in adult, unspecified obesity type - Starting BMI greater then 30  PLAN:  Vitamin D Deficiency Jill Atkinson was informed that low vitamin D levels contributes to fatigue and are associated with obesity, breast, and colon cancer. She agrees to continue to take prescription Vit D @50 ,000 IU every week and will follow up for routine testing of vitamin D, at least 2-3 times per year. She was informed of the risk of over-replacement of vitamin D and agrees to not increase her dose unless she discusses this with [place] first.  Pre-Diabetes Jill Atkinson will continue to work on weight loss, exercise, increasing lean protein and decreasing simple carbohydrates in her  diet to help decrease the risk of diabetes. She was informed that eating too many simple carbohydrates or too many calories at one sitting increases the likelihood of GI side effects.  Jill Atkinson agreed to follow up with [place] as directed to monitor her progress.  I spent > than 50% of the 15 minute visit on counseling as documented in the note.  Obesity [name] is currently in the action stage of change. As such, her goal is to continue with weight loss efforts She has agreed to follow the Category 3 plan Jill Atkinson has been instructed to work up to a goal of 150 minutes of combined cardio and strengthening exercise per week or using rowing machine for 12 minutes per day 6 days per week for weight loss and overall health benefits. We discussed the following Behavioral Modification Strategies today: increase H2O intake, no skipping meals, keeping healthy foods in the home, increasing lean protein intake and substitute her protein with other sources, decreasing simple carbohydrates, increasing vegetables, decrease eating out and work on meal planning and easy cooking plans  Jill Atkinson has agreed to follow up with our clinic in 2 weeks. She was informed of the importance of frequent follow up visits to maximize her success with intensive lifestyle modifications for her multiple health conditions.  ALLERGIES: Allergies  Allergen Reactions  . Bactrim [Sulfamethoxazole-Trimethoprim]     Lip swelling    MEDICATIONS: Current Outpatient Medications on File Prior to Visit  Medication Sig Dispense Refill  . albuterol (VENTOLIN HFA) 108 (90  Base) MCG/ACT inhaler Inhale 2 puffs into the lungs every 6 (six) hours as needed for wheezing. 18 g 3  . buPROPion (WELLBUTRIN XL) 300 MG 24 hr tablet Take 1 tablet (300 mg total) by mouth daily. 90 tablet 1  . cetirizine (ZYRTEC) 10 MG tablet Take 10 mg by mouth daily.    . famotidine (PEPCID) 10 MG tablet Take 10 mg by mouth 2 (two) times daily.    . Fluticasone  Propionate (FLONASE NA) Place into the nose.    . lansoprazole (PREVACID) 15 MG capsule Take 15 mg by mouth daily at 12 noon.    . Levocetirizine Dihydrochloride (XYZAL ALLERGY 24HR PO) Take by mouth daily.    Marland Kitchen lisinopril (PRINIVIL,ZESTRIL) 10 MG tablet TAKE 2 TABLETS BY MOUTH EVERY DAY 60 tablet 0  . lisinopril (PRINIVIL,ZESTRIL) 20 MG tablet Take 1 tablet (20 mg total) by mouth daily. 90 tablet 3  . Vitamin D, Ergocalciferol, (DRISDOL) 1.25 MG (50000 UT) CAPS capsule Take 1 capsule (50,000 Units total) by mouth every 7 (seven) days. 4 capsule 0  . escitalopram (LEXAPRO) 10 MG tablet Take 1 tablet (10 mg total) by mouth daily for 30 doses. 90 tablet 3   No current facility-administered medications on file prior to visit.     PAST MEDICAL HISTORY: Past Medical History:  Diagnosis Date  . Allergy    hay fever  . Anxiety   . Arrhythmia    AVNRT, s/p ablation Dr. Westley Gambles at [facility]  . Asthma    exercise induced  . Chicken pox   . Depression    post partum  . Food allergy   . GERD (gastroesophageal reflux disease)   . Hypertension   . IBS (irritable bowel syndrome)   . Palpitations   . Reflux esophagitis   . Shingles   . Shortness of breath   . Swelling of both lower extremities   . UTI (lower urinary tract infection)     PAST SURGICAL HISTORY: Past Surgical History:  Procedure Laterality Date  . CARDIAC ELECTROPHYSIOLOGY STUDY AND ABLATION  2012   . COLONOSCOPY WITH PROPOFOL N/A 11/02/2015   Procedure: COLONOSCOPY WITH PROPOFOL;  Surgeon: Manya Silvas, MD;  Location: [facility] ENDOSCOPY;  Service: Endoscopy;  Laterality: N/A;  . [place]   post delivery  . NASAL SINUS SURGERY  2006  . TONSILLECTOMY  1978    SOCIAL HISTORY: Social History   Tobacco Use  . Smoking status: Never Smoker  . Smokeless tobacco: Never Used  Substance Use Topics  . Alcohol use: Yes    Alcohol/week: 2.0 standard drinks    Types: 2 Glasses of wine per week     Comment: occasional, wine  . Drug use: No    FAMILY HISTORY: Family History  Problem Relation Age of Onset  . Arthritis Mother   . Hypertension Mother   . Thyroid disease Mother   . Arthritis Father   . Alcohol abuse Maternal Grandmother   . Drug abuse Maternal Grandmother   . Arthritis Maternal Grandmother   . Stroke Maternal Grandmother   . Hypertension Maternal Grandmother   . Alcohol abuse Maternal Grandfather   . Drug abuse Maternal Grandfather   . Arthritis Maternal Grandfather   . Hypertension Maternal Grandfather     ROS: Review of Systems  Constitutional: Negative for weight loss.  Gastrointestinal: Negative for nausea and vomiting.  Musculoskeletal:       Negative for muscle weakness  Endo/Heme/Allergies:  Negative for hypoglycemia    PHYSICAL EXAM: Blood pressure 134/79, pulse 70, temperature 97.8 F (36.6 C), temperature source Oral, height 5\' 6"  (1.676 m), weight 184 lb (83.5 kg), last menstrual period 05/26/2016, SpO2 97 %. Body mass index is 29.7 kg/m. Physical Exam Vitals signs reviewed.  Constitutional:      Appearance: Normal appearance. She is well-developed. She is obese.  Cardiovascular:     Rate and Rhythm: Normal rate.  Pulmonary:     Effort: Pulmonary effort is normal.  Musculoskeletal: Normal range of motion.  Skin:    General: Skin is warm and dry.  Neurological:     Mental Status: She is alert and oriented to person, place, and time.  Psychiatric:        Mood and Affect: Mood normal.        Behavior: Behavior normal.     RECENT LABS AND TESTS: BMET    Component Value Date/Time   NA 140 08/27/2018 1520   NA 139 08/02/2012 0954   K 4.1 08/27/2018 1520   CL 104 08/27/2018 1520   CO2 26 08/27/2018 1520   GLUCOSE 104 (H) 08/27/2018 1520   BUN 15 08/27/2018 1520   BUN 8 08/02/2012 0954   CREATININE 0.88 08/27/2018 1520   CALCIUM 9.6 08/27/2018 1520   GFRNONAA 103 08/02/2012 0954   GFRAA 118 08/02/2012 0954   Lab  Results  Component Value Date   HGBA1C 5.7 (H) 10/30/2018   HGBA1C 5.3 08/24/2017   HGBA1C 5.5 08/11/2015   Lab Results  Component Value Date   INSULIN 18.3 10/30/2018   CBC    Component Value Date/Time   WBC 5.1 08/24/2017 0922   RBC 4.71 08/24/2017 0922   HGB 15.2 (H) 08/24/2017 0922   HCT 45.4 08/24/2017 0922   PLT 331.0 08/24/2017 0922   MCV 96.3 08/24/2017 0922   MCH 29.0 08/02/2012 0954   MCHC 33.5 08/24/2017 0922   RDW 12.8 08/24/2017 0922   RDW 14.4 08/02/2012 0954   LYMPHSABS 1.7 08/24/2017 0922   LYMPHSABS 1.5 08/02/2012 0954   MONOABS 0.5 08/24/2017 0922   EOSABS 0.1 08/24/2017 0922   EOSABS 0.1 08/02/2012 0954   BASOSABS 0.0 08/24/2017 0922   BASOSABS 0.0 08/02/2012 0954   Iron/TIBC/Ferritin/ %Sat No results found for: IRON, TIBC, FERRITIN, IRONPCTSAT Lipid Panel     Component Value Date/Time   CHOL 225 (H) 10/30/2018 0901   TRIG 109 10/30/2018 0901   HDL 61 10/30/2018 0901   CHOLHDL 4 08/24/2017 0922   VLDL 21.8 08/24/2017 0922   LDLCALC 142 (H) 10/30/2018 0901   LDLDIRECT 142.3 08/06/2013 0911   Hepatic Function Panel     Component Value Date/Time   PROT 6.9 08/27/2018 1520   PROT 6.8 08/02/2012 0954   ALBUMIN 4.5 08/27/2018 1520   ALBUMIN 4.4 08/02/2012 0954   AST 19 08/27/2018 1520   ALT 23 08/27/2018 1520   ALKPHOS 76 08/27/2018 1520   BILITOT 0.4 08/27/2018 1520      Component Value Date/Time   TSH 2.15 08/27/2018 1520   TSH 1.16 08/24/2017 0922   TSH 1.85 08/11/2015 0909     Ref. Range 10/30/2018 09:01  Vitamin D, 25-Hydroxy Latest Ref Range: 30.0 - 100.0 ng/mL 17.8 (L)     OBESITY BEHAVIORAL INTERVENTION VISIT  Today's visit was # 4   Starting weight: 190 lbs Starting date: 10/30/2018 Today's weight : 184 lbs Today's date: 12/12/2018 Total lbs lost to date: 6    12/12/2018  Height 5'  6" (1.676 m)  Weight 184 lb (83.5 kg)  BMI (Calculated) 29.71  BLOOD PRESSURE - SYSTOLIC 741  BLOOD PRESSURE - DIASTOLIC 79   Body Fat %  32.9 %  Total Body Water (lbs) 77.8 lbs    ASK: We discussed the diagnosis of obesity with [name] today and Sharyn Lull agreed to give [place] permission to discuss obesity behavioral modification therapy today.  ASSESS: Tiani has the diagnosis of obesity and her BMI today is 29.71 [place] is in the action stage of change   ADVISE: Gracin was educated on the multiple health risks of obesity as well as the benefit of weight loss to improve her health. She was advised of the need for long term treatment and the importance of lifestyle modifications to improve her current health and to decrease her risk of future health problems.  AGREE: Multiple dietary modification options and treatment options were discussed and  Mirela agreed to follow the recommendations documented in the above note.  ARRANGE: Aliahna was educated on the importance of frequent visits to treat obesity as outlined per CMS and USPSTF guidelines and agreed to schedule her next follow up appointment today.  Corey Skains, am acting as Location manager for [employer]. Owens Shark, DO  I have reviewed the above documentation for accuracy and completeness, and I agree with the above. -Jearld Lesch, DO

## 2018-12-24 ENCOUNTER — Encounter (INDEPENDENT_AMBULATORY_CARE_PROVIDER_SITE_OTHER): Payer: Self-pay | Admitting: Bariatrics

## 2018-12-26 ENCOUNTER — Ambulatory Visit (INDEPENDENT_AMBULATORY_CARE_PROVIDER_SITE_OTHER): Payer: BLUE CROSS/BLUE SHIELD | Admitting: Bariatrics

## 2018-12-26 ENCOUNTER — Encounter (INDEPENDENT_AMBULATORY_CARE_PROVIDER_SITE_OTHER): Payer: Self-pay | Admitting: Bariatrics

## 2018-12-26 VITALS — BP 130/78 | HR 68 | Temp 97.6°F | Ht 66.0 in | Wt 182.0 lb

## 2018-12-26 DIAGNOSIS — R7303 Prediabetes: Secondary | ICD-10-CM

## 2018-12-26 DIAGNOSIS — I10 Essential (primary) hypertension: Secondary | ICD-10-CM | POA: Diagnosis not present

## 2018-12-26 DIAGNOSIS — Z6830 Body mass index (BMI) 30.0-30.9, adult: Secondary | ICD-10-CM

## 2018-12-26 DIAGNOSIS — E669 Obesity, unspecified: Secondary | ICD-10-CM

## 2018-12-26 NOTE — Progress Notes (Signed)
Office: [phone]  /  Fax: [phone]   HPI:   Chief Complaint: OBESITY Jill Atkinson is here to discuss her progress with her obesity treatment plan. She is on the Category 3 plan and is following her eating plan approximately 75 % of the time. She states she is running for 12 minutes 4 times a week and dancing for 40 minutes 1 to 2 times per week. Jill Atkinson is doing well overall. She denies any struggles. Her weight is 182 lb (82.6 kg) today and has had a weight loss of 2 pounds over a period of 2 weeks since her last visit. She has lost 8 lbs since starting treatment with [place].  Pre-Diabetes Jill Atkinson has a diagnosis of prediabetes based on her elevated Hgb A1c and was informed this puts her at greater risk of developing diabetes. She is not taking medications currently and continues to work on diet and exercise to decrease risk of diabetes.   Hypertension Jill Atkinson is a 53 y.o. female with hypertension. She is currently taking Lisinopril. Jill Atkinson denies chest pain. She is working weight loss to help control her blood pressure with the goal of decreasing her risk of heart attack and stroke. Michelles blood pressure is well controlled.  ASSESSMENT AND PLAN:  Prediabetes  Essential hypertension  Class 1 obesity with serious comorbidity and body mass index (BMI) of 30.0 to 30.9 in adult, unspecified obesity type - Starting BMI greater then 30  PLAN:  Pre-Diabetes Jill Atkinson will continue to work on weight loss, exercise, increasing lean protein and decreasing simple carbohydrates in her diet to help decrease the risk of diabetes. She was informed that eating too many simple carbohydrates or too many calories at one sitting increases the likelihood of GI side effects. Jill Atkinson agreed to follow up with [place] as directed to monitor her progress.  Hypertension We discussed sodium restriction, working on healthy weight loss, and a regular exercise program as the means to achieve  improved blood pressure control. Chee agreed with this plan and agreed to follow up as directed. We will continue to monitor her blood pressure as well as her progress with the above lifestyle modifications. She will continue her medications as prescribed and will watch for signs of hypotension as she continues her lifestyle modifications.  I spent > than 50% of the 15 minute visit on counseling as documented in the note.  Obesity Jill Atkinson is currently in the action stage of change. As such, her goal is to continue with weight loss efforts She has agreed to follow the Category 3 plan Jill Atkinson has been instructed to work up to a goal of 150 minutes of combined cardio and strengthening exercise per week for weight loss and overall health benefits. We discussed the following Behavioral Modification Strategies today: increase H2O intake, no skipping meals, keeping healthy foods in the home, increasing lean protein intake, decreasing simple carbohydrates, increasing vegetables, decrease eating out and work on meal planning and easy cooking plans Handouts for homemade seasonings and store bought seasonings were given to patient today.  Jill Atkinson has agreed to follow up with our clinic in 2 weeks. She was informed of the importance of frequent follow up visits to maximize her success with intensive lifestyle modifications for her multiple health conditions.  ALLERGIES: Allergies  Allergen Reactions  . Bactrim [Sulfamethoxazole-Trimethoprim]     Lip swelling    MEDICATIONS: Current Outpatient Medications on File Prior to Visit  Medication Sig Dispense Refill  . albuterol (VENTOLIN HFA) 108 (90  Base) MCG/ACT inhaler Inhale 2 puffs into the lungs every 6 (six) hours as needed for wheezing. 18 g 3  . buPROPion (WELLBUTRIN XL) 300 MG 24 hr tablet Take 1 tablet (300 mg total) by mouth daily. 90 tablet 1  . cetirizine (ZYRTEC) 10 MG tablet Take 10 mg by mouth daily.    . famotidine (PEPCID) 10 MG  tablet Take 10 mg by mouth 2 (two) times daily.    . Fluticasone Propionate (FLONASE NA) Place into the nose.    . lansoprazole (PREVACID) 15 MG capsule Take 15 mg by mouth daily at 12 noon.    . Levocetirizine Dihydrochloride (XYZAL ALLERGY 24HR PO) Take by mouth daily.    Marland Kitchen lisinopril (PRINIVIL,ZESTRIL) 10 MG tablet TAKE 2 TABLETS BY MOUTH EVERY DAY 60 tablet 0  . lisinopril (PRINIVIL,ZESTRIL) 20 MG tablet Take 1 tablet (20 mg total) by mouth daily. 90 tablet 3  . Vitamin D, Ergocalciferol, (DRISDOL) 1.25 MG (50000 UT) CAPS capsule Take 1 capsule (50,000 Units total) by mouth every 7 (seven) days. 4 capsule 0  . escitalopram (LEXAPRO) 10 MG tablet Take 1 tablet (10 mg total) by mouth daily for 30 doses. 90 tablet 3   No current facility-administered medications on file prior to visit.     PAST MEDICAL HISTORY: Past Medical History:  Diagnosis Date  . Allergy    hay fever  . Anxiety   . Arrhythmia    AVNRT, s/p ablation Dr. Westley Gambles at [facility]  . Asthma    exercise induced  . Chicken pox   . Depression    post partum  . Food allergy   . GERD (gastroesophageal reflux disease)   . Hypertension   . IBS (irritable bowel syndrome)   . Palpitations   . Reflux esophagitis   . Shingles   . Shortness of breath   . Swelling of both lower extremities   . UTI (lower urinary tract infection)     PAST SURGICAL HISTORY: Past Surgical History:  Procedure Laterality Date  . CARDIAC ELECTROPHYSIOLOGY STUDY AND ABLATION  2012   . COLONOSCOPY WITH PROPOFOL N/A 11/02/2015   Procedure: COLONOSCOPY WITH PROPOFOL;  Surgeon: Manya Silvas, MD;  Location: [facility] ENDOSCOPY;  Service: Endoscopy;  Laterality: N/A;  . [place]   post delivery  . NASAL SINUS SURGERY  2006  . TONSILLECTOMY  1978    SOCIAL HISTORY: Social History   Tobacco Use  . Smoking status: Never Smoker  . Smokeless tobacco: Never Used  Substance Use Topics  . Alcohol use: Yes    Alcohol/week:  2.0 standard drinks    Types: 2 Glasses of wine per week    Comment: occasional, wine  . Drug use: No    FAMILY HISTORY: Family History  Problem Relation Age of Onset  . Arthritis Mother   . Hypertension Mother   . Thyroid disease Mother   . Arthritis Father   . Alcohol abuse Maternal Grandmother   . Drug abuse Maternal Grandmother   . Arthritis Maternal Grandmother   . Stroke Maternal Grandmother   . Hypertension Maternal Grandmother   . Alcohol abuse Maternal Grandfather   . Drug abuse Maternal Grandfather   . Arthritis Maternal Grandfather   . Hypertension Maternal Grandfather     ROS: Review of Systems  Constitutional: Positive for weight loss.  Cardiovascular: Negative for chest pain.    PHYSICAL EXAM: Blood pressure 130/78, pulse 68, temperature 97.6 F (36.4 C), temperature source Oral,  height 5\' 6"  (1.676 m), weight 182 lb (82.6 kg), last menstrual period 05/26/2016, SpO2 99 %. Body mass index is 29.38 kg/m. Physical Exam Vitals signs reviewed.  Constitutional:      Appearance: Normal appearance. She is well-developed. She is obese.  Cardiovascular:     Rate and Rhythm: Normal rate.  Pulmonary:     Effort: Pulmonary effort is normal.  Musculoskeletal: Normal range of motion.  Skin:    General: Skin is warm and dry.  Neurological:     Mental Status: She is alert and oriented to person, place, and time.  Psychiatric:        Mood and Affect: Mood normal.        Behavior: Behavior normal.     RECENT LABS AND TESTS: BMET    Component Value Date/Time   NA 140 08/27/2018 1520   NA 139 08/02/2012 0954   K 4.1 08/27/2018 1520   CL 104 08/27/2018 1520   CO2 26 08/27/2018 1520   GLUCOSE 104 (H) 08/27/2018 1520   BUN 15 08/27/2018 1520   BUN 8 08/02/2012 0954   CREATININE 0.88 08/27/2018 1520   CALCIUM 9.6 08/27/2018 1520   GFRNONAA 103 08/02/2012 0954   GFRAA 118 08/02/2012 0954   Lab Results  Component Value Date   HGBA1C 5.7 (H) 10/30/2018    HGBA1C 5.3 08/24/2017   HGBA1C 5.5 08/11/2015   Lab Results  Component Value Date   INSULIN 18.3 10/30/2018   CBC    Component Value Date/Time   WBC 5.1 08/24/2017 0922   RBC 4.71 08/24/2017 0922   HGB 15.2 (H) 08/24/2017 0922   HCT 45.4 08/24/2017 0922   PLT 331.0 08/24/2017 0922   MCV 96.3 08/24/2017 0922   MCH 29.0 08/02/2012 0954   MCHC 33.5 08/24/2017 0922   RDW 12.8 08/24/2017 0922   RDW 14.4 08/02/2012 0954   LYMPHSABS 1.7 08/24/2017 0922   LYMPHSABS 1.5 08/02/2012 0954   MONOABS 0.5 08/24/2017 0922   EOSABS 0.1 08/24/2017 0922   EOSABS 0.1 08/02/2012 0954   BASOSABS 0.0 08/24/2017 0922   BASOSABS 0.0 08/02/2012 0954   Iron/TIBC/Ferritin/ %Sat No results found for: IRON, TIBC, FERRITIN, IRONPCTSAT Lipid Panel     Component Value Date/Time   CHOL 225 (H) 10/30/2018 0901   TRIG 109 10/30/2018 0901   HDL 61 10/30/2018 0901   CHOLHDL 4 08/24/2017 0922   VLDL 21.8 08/24/2017 0922   LDLCALC 142 (H) 10/30/2018 0901   LDLDIRECT 142.3 08/06/2013 0911   Hepatic Function Panel     Component Value Date/Time   PROT 6.9 08/27/2018 1520   PROT 6.8 08/02/2012 0954   ALBUMIN 4.5 08/27/2018 1520   ALBUMIN 4.4 08/02/2012 0954   AST 19 08/27/2018 1520   ALT 23 08/27/2018 1520   ALKPHOS 76 08/27/2018 1520   BILITOT 0.4 08/27/2018 1520      Component Value Date/Time   TSH 2.15 08/27/2018 1520   TSH 1.16 08/24/2017 0922   TSH 1.85 08/11/2015 0909   Results for FLOREAN, HOOBLER (MRN [id number]) as of 12/26/2018 12:58  Ref. Range 10/30/2018 09:01  Vitamin D, 25-Hydroxy Latest Ref Range: 30.0 - 100.0 ng/mL 17.8 (L)     OBESITY BEHAVIORAL INTERVENTION VISIT  Today's visit was # 5   Starting weight: 190 lbs Starting date: 10/30/2018 Today's weight : 182 lbs Today's date: 12/26/2018 Total lbs lost to date: 8    12/26/2018  Height 5\' 6"  (1.676 m)  Weight 182 lb (82.6 kg)  BMI (  Calculated) 29.39  BLOOD PRESSURE - SYSTOLIC 417  BLOOD PRESSURE - DIASTOLIC 78   Body  Fat % 31.7 %  Total Body Water (lbs) 76 lbs    ASK: We discussed the diagnosis of obesity with Jill Atkinson today and Sharyn Lull agreed to give [place] permission to discuss obesity behavioral modification therapy today.  ASSESS: Amirah has the diagnosis of obesity and her BMI today is 29.39 Brittany is in the action stage of change   ADVISE: Margit was educated on the multiple health risks of obesity as well as the benefit of weight loss to improve her health. She was advised of the need for long term treatment and the importance of lifestyle modifications to improve her current health and to decrease her risk of future health problems.  AGREE: Multiple dietary modification options and treatment options were discussed and  [place]etta agreed to follow the recommendations documented in the above note.  ARRANGE: Dossie was educated on the importance of frequent visits to treat obesity as outlined per CMS and USPSTF guidelines and agreed to schedule her next follow up appointment today.  Corey Skains, am acting as Location manager for [employer]. Owens Shark, DO  I have reviewed the above documentation for accuracy and completeness, and I agree with the above. -Jearld Lesch, DO

## 2019-01-02 ENCOUNTER — Encounter: Payer: Self-pay | Admitting: Family

## 2019-01-09 ENCOUNTER — Encounter (INDEPENDENT_AMBULATORY_CARE_PROVIDER_SITE_OTHER): Payer: Self-pay

## 2019-01-10 ENCOUNTER — Ambulatory Visit (INDEPENDENT_AMBULATORY_CARE_PROVIDER_SITE_OTHER): Payer: BLUE CROSS/BLUE SHIELD | Admitting: Bariatrics

## 2019-01-10 ENCOUNTER — Other Ambulatory Visit: Payer: Self-pay

## 2019-01-10 ENCOUNTER — Encounter (INDEPENDENT_AMBULATORY_CARE_PROVIDER_SITE_OTHER): Payer: Self-pay | Admitting: Bariatrics

## 2019-01-10 DIAGNOSIS — E669 Obesity, unspecified: Secondary | ICD-10-CM

## 2019-01-10 DIAGNOSIS — I10 Essential (primary) hypertension: Secondary | ICD-10-CM

## 2019-01-10 DIAGNOSIS — E559 Vitamin D deficiency, unspecified: Secondary | ICD-10-CM | POA: Diagnosis not present

## 2019-01-10 DIAGNOSIS — Z6830 Body mass index (BMI) 30.0-30.9, adult: Secondary | ICD-10-CM | POA: Diagnosis not present

## 2019-01-10 MED ORDER — VITAMIN D (ERGOCALCIFEROL) 1.25 MG (50000 UNIT) PO CAPS: 50000.0000 [IU] | ORAL_CAPSULE | ORAL | 0 refills | Status: DC

## 2019-01-14 NOTE — Progress Notes (Addendum)
Office: [phone]  /  Fax: [phone] TeleHealth Visit:  Jill Atkinson has consented to this TeleHealth visit today via [place]. The patient is located at home, the provider is located at the [employer] [facility] office. The participants in this visit include the listed provider and patient and any and all parties involved.   HPI:   Chief Complaint: OBESITY Jill Atkinson is here to discuss her progress with her obesity treatment plan. She is on the Category 3 plan and is following her eating plan approximately 70 % of the time. She states she is rowing and walking for 30 minutes 4 times per week. Jill Atkinson feels as though she has maintained her weight over the last few weeks. She has been able to find her food. Jill Atkinson denies inappropriate hunger. Her scale at home shows that her weight is the same. We were unable to weight the patient today for this TeleHealth visit.She feels as if she has maintained weight since her last visit. She has lost 8 lbs since starting treatment with [place].  Vitamin D deficiency Jill Atkinson has a diagnosis of vitamin D deficiency. Her last vitamin D level was at 17.8 Kauri is currently taking vit D and she denies nausea, vomiting or muscle weakness.  Hypertension Jill Atkinson is a 53 y.o. female with hypertension. She is currently taking Lisinopril. Her blood Atkinson at home yesterday, was at 130/80  Jill Atkinson denies chest pain or shortness of breath on exertion. She is working weight loss to help control her blood Atkinson with the goal of decreasing her risk of heart attack and stroke. Jill Atkinson is currently controlled.  ASSESSMENT AND PLAN:  Vitamin D deficiency - Plan: Vitamin D, Ergocalciferol, (DRISDOL) 1.25 MG (50000 UT) CAPS capsule  Essential hypertension  Class 1 obesity with serious comorbidity and body mass index (BMI) of 30.0 to 30.9 in adult, unspecified obesity type - Beginning BMI >30  PLAN:  Vitamin D  Deficiency Jill Atkinson was informed that low vitamin D levels contributes to fatigue and are associated with obesity, breast, and colon cancer. She agrees to continue to take prescription Vit D @50 ,000 IU every week #4 with no refills and will follow up for routine testing of vitamin D, at least 2-3 times per year. She was informed of the risk of over-replacement of vitamin D and agrees to not increase her dose unless she discusses this with [place] first. Jill Atkinson agrees to follow up as directed.  Hypertension We discussed sodium restriction, working on healthy weight loss, and a regular exercise program as the means to achieve improved blood Atkinson control. Jill Atkinson agreed with this plan and agreed to follow up as directed. We will continue to monitor her blood Atkinson as well as her progress with the above lifestyle modifications. She will continue her medications as prescribed and will watch for signs of hypotension as she continues her lifestyle modifications.  Obesity Jill Atkinson is currently in the action stage of change. As such, her goal is to continue with weight loss efforts She has agreed to follow the Category 3 plan Jill Atkinson has been instructed to work up to a goal of 150 minutes of combined cardio and strengthening exercise per week or continue rowing in the morning and walking the dogs for weight loss and overall health benefits. We discussed the following Behavioral Modification Strategies today: increase H2O intake, no skipping meals, keeping healthy foods in the home, increasing lean protein intake, decreasing simple carbohydrates, increasing vegetables, decrease eating out, work on  meal planning and easy cooking plans, dealing with family or coworker sabotage, travel eating strategies, celebration eating strategies and holiday eating strategies  Jill Atkinson will weigh herself at home, prior to her next visit.  Jill Atkinson has agreed to follow up with our clinic in 2 weeks. She was informed of the  importance of frequent follow up visits to maximize her success with intensive lifestyle modifications for her multiple health conditions.  ALLERGIES: Allergies  Allergen Reactions  . Bactrim [Sulfamethoxazole-Trimethoprim]     Lip swelling    MEDICATIONS: Current Outpatient Medications on File Prior to Visit  Medication Sig Dispense Refill  . albuterol (VENTOLIN HFA) 108 (90 Base) MCG/ACT inhaler Inhale 2 puffs into the lungs every 6 (six) hours as needed for wheezing. 18 g 3  . buPROPion (WELLBUTRIN XL) 300 MG 24 hr tablet Take 1 tablet (300 mg total) by mouth daily. 90 tablet 1  . escitalopram (LEXAPRO) 10 MG tablet Take 1 tablet (10 mg total) by mouth daily for 30 doses. 90 tablet 3  . famotidine (PEPCID) 10 MG tablet Take 10 mg by mouth 2 (two) times daily.    . Fluticasone Propionate (FLONASE NA) Place into the nose.    . lansoprazole (PREVACID) 15 MG capsule Take 15 mg by mouth daily at 12 noon.    . Levocetirizine Dihydrochloride (XYZAL ALLERGY 24HR PO) Take by mouth daily.    Marland Kitchen lisinopril (PRINIVIL,ZESTRIL) 20 MG tablet Take 1 tablet (20 mg total) by mouth daily. 90 tablet 3   No current facility-administered medications on file prior to visit.     PAST MEDICAL HISTORY: Past Medical History:  Diagnosis Date  . Allergy    hay fever  . Anxiety   . Arrhythmia    AVNRT, s/p ablation Dr. Westley Gambles at [facility]  . Asthma    exercise induced  . Chicken pox   . Depression    post partum  . Food allergy   . GERD (gastroesophageal reflux disease)   . Hypertension   . IBS (irritable bowel syndrome)   . Palpitations   . Reflux esophagitis   . Shingles   . Shortness of breath   . Swelling of both lower extremities   . UTI (lower urinary tract infection)     PAST SURGICAL HISTORY: Past Surgical History:  Procedure Laterality Date  . CARDIAC ELECTROPHYSIOLOGY STUDY AND ABLATION  2012   . COLONOSCOPY WITH PROPOFOL N/A 11/02/2015   Procedure: COLONOSCOPY WITH PROPOFOL;  Surgeon:  Manya Silvas, MD;  Location: [facility] ENDOSCOPY;  Service: Endoscopy;  Laterality: N/A;  . [place]   post delivery  . NASAL SINUS SURGERY  2006  . TONSILLECTOMY  1978    SOCIAL HISTORY: Social History   Tobacco Use  . Smoking status: Never Smoker  . Smokeless tobacco: Never Used  Substance Use Topics  . Alcohol use: Yes    Alcohol/week: 2.0 standard drinks    Types: 2 Glasses of wine per week    Comment: occasional, wine  . Drug use: No    FAMILY HISTORY: Family History  Problem Relation Age of Onset  . Arthritis Mother   . Hypertension Mother   . Thyroid disease Mother   . Arthritis Father   . Alcohol abuse Maternal Grandmother   . Drug abuse Maternal Grandmother   . Arthritis Maternal Grandmother   . Stroke Maternal Grandmother   . Hypertension Maternal Grandmother   . Alcohol abuse Maternal Grandfather   . Drug abuse  Maternal Grandfather   . Arthritis Maternal Grandfather   . Hypertension Maternal Grandfather     ROS: Review of Systems  Constitutional: Negative for weight loss.  Respiratory: Negative for shortness of breath (on exertion).   Cardiovascular: Negative for chest pain.  Gastrointestinal: Negative for nausea and vomiting.  Musculoskeletal:       Negative for muscle weakness    PHYSICAL EXAM: Pt in no acute distress  RECENT LABS AND TESTS: BMET    Component Value Date/Time   NA 140 08/27/2018 1520   NA 139 08/02/2012 0954   K 4.1 08/27/2018 1520   CL 104 08/27/2018 1520   CO2 26 08/27/2018 1520   GLUCOSE 104 (H) 08/27/2018 1520   BUN 15 08/27/2018 1520   BUN 8 08/02/2012 0954   CREATININE 0.88 08/27/2018 1520   CALCIUM 9.6 08/27/2018 1520   GFRNONAA 103 08/02/2012 0954   GFRAA 118 08/02/2012 0954   Lab Results  Component Value Date   HGBA1C 5.7 (H) 10/30/2018   HGBA1C 5.3 08/24/2017   HGBA1C 5.5 08/11/2015   Lab Results  Component Value Date   INSULIN 18.3 10/30/2018   CBC    Component Value  Date/Time   WBC 5.1 08/24/2017 0922   RBC 4.71 08/24/2017 0922   HGB 15.2 (H) 08/24/2017 0922   HCT 45.4 08/24/2017 0922   PLT 331.0 08/24/2017 0922   MCV 96.3 08/24/2017 0922   MCH 29.0 08/02/2012 0954   MCHC 33.5 08/24/2017 0922   RDW 12.8 08/24/2017 0922   RDW 14.4 08/02/2012 0954   LYMPHSABS 1.7 08/24/2017 0922   LYMPHSABS 1.5 08/02/2012 0954   MONOABS 0.5 08/24/2017 0922   EOSABS 0.1 08/24/2017 0922   EOSABS 0.1 08/02/2012 0954   BASOSABS 0.0 08/24/2017 0922   BASOSABS 0.0 08/02/2012 0954   Iron/TIBC/Ferritin/ %Sat No results found for: IRON, TIBC, FERRITIN, IRONPCTSAT Lipid Panel     Component Value Date/Time   CHOL 225 (H) 10/30/2018 0901   TRIG 109 10/30/2018 0901   HDL 61 10/30/2018 0901   CHOLHDL 4 08/24/2017 0922   VLDL 21.8 08/24/2017 0922   LDLCALC 142 (H) 10/30/2018 0901   LDLDIRECT 142.3 08/06/2013 0911   Hepatic Function Panel     Component Value Date/Time   PROT 6.9 08/27/2018 1520   PROT 6.8 08/02/2012 0954   ALBUMIN 4.5 08/27/2018 1520   ALBUMIN 4.4 08/02/2012 0954   AST 19 08/27/2018 1520   ALT 23 08/27/2018 1520   ALKPHOS 76 08/27/2018 1520   BILITOT 0.4 08/27/2018 1520      Component Value Date/Time   TSH 2.15 08/27/2018 1520   TSH 1.16 08/24/2017 0922   TSH 1.85 08/11/2015 0909     Ref. Range 10/30/2018 09:01  Vitamin D, 25-Hydroxy Latest Ref Range: 30.0 - 100.0 ng/mL 17.8 (L)     I, Jill Atkinson, am acting as Location manager for [employer]. Owens Shark, DO  I have reviewed the above documentation for accuracy and completeness, and I agree with the above. -Jearld Lesch, DO

## 2019-01-23 ENCOUNTER — Other Ambulatory Visit: Payer: Self-pay

## 2019-01-23 ENCOUNTER — Ambulatory Visit (INDEPENDENT_AMBULATORY_CARE_PROVIDER_SITE_OTHER): Payer: BLUE CROSS/BLUE SHIELD | Admitting: Bariatrics

## 2019-01-23 ENCOUNTER — Encounter (INDEPENDENT_AMBULATORY_CARE_PROVIDER_SITE_OTHER): Payer: Self-pay | Admitting: Bariatrics

## 2019-01-23 DIAGNOSIS — Z6830 Body mass index (BMI) 30.0-30.9, adult: Secondary | ICD-10-CM

## 2019-01-23 DIAGNOSIS — E559 Vitamin D deficiency, unspecified: Secondary | ICD-10-CM

## 2019-01-23 DIAGNOSIS — E669 Obesity, unspecified: Secondary | ICD-10-CM

## 2019-01-23 DIAGNOSIS — I10 Essential (primary) hypertension: Secondary | ICD-10-CM | POA: Diagnosis not present

## 2019-01-23 NOTE — Progress Notes (Signed)
Office: [phone]  /  Fax: [phone] TeleHealth Visit:  Jill Atkinson has verbally consented to this TeleHealth visit today. The patient is located at home, the provider is located at the [employer] [facility] office. The participants in this visit include the listed provider and patient and any and all parties involved. The visit was conducted today via [place].  HPI:   Chief Complaint: OBESITY Jill Atkinson is here to discuss her progress with her obesity treatment plan. She is on the Category 3 plan and is following her eating plan approximately 70 % of the time. She states she is rowing for 15 minutes 4 times per week and walking for 30 minutes 3 times per week. Lagretta thinks that she has lost 1 to 2 pounds. She is working from home. She denies stress eating and overall her appetite is normal. Cyla wants to stop visits at this time. We were unable to weigh the patient today for this TeleHealth visit. She feels as if she has lost weight since her last visit. She has lost 8 lbs since starting treatment with [place].  Hypertension Jill Atkinson is a 53 y.o. female with hypertension. She is taking Lisinopril. Her last blood pressure was at 130/78 at our office and her blood pressure today was at 135/85. [name] denies chest pain or shortness of breath on exertion. She is working weight loss to help control her blood pressure with the goal of decreasing her risk of heart attack and stroke.   Vitamin D deficiency Jill Atkinson has a diagnosis of vitamin D deficiency. She is taking high dose vit D and denies nausea, vomiting or muscle weakness.  ASSESSMENT AND PLAN:  Essential hypertension  Vitamin D deficiency  Class 1 obesity with serious comorbidity and body mass index (BMI) of 30.0 to 30.9 in adult, unspecified obesity type - Starting BMI greater then 30  PLAN:  Hypertension We discussed sodium restriction, working on healthy weight loss, and a regular exercise  program as the means to achieve improved blood pressure control. Emalene agreed with this plan and agreed to follow up as directed. We will continue to monitor her blood pressure as well as her progress with the above lifestyle modifications. She will continue her medications as prescribed and will continue checking her blood pressure.  Vitamin D Deficiency [name] was informed that low vitamin D levels contributes to fatigue and are associated with obesity, breast, and colon cancer. She will continue to take high dose prescription Vit D @50 ,000 IU every week and will follow up for routine testing of vitamin D, at least 2-3 times per year. She was informed of the risk of over-replacement of vitamin D and agrees to not increase her dose unless she discusses this with [place] first.  Obesity Jill Atkinson is currently in the action stage of change. As such, her goal is to continue with weight loss efforts She has agreed to follow the Category 3 plan Jill Atkinson has been instructed to work up to a goal of 150 minutes of combined cardio and strengthening exercise per week for weight loss and overall health benefits. We discussed the following Behavioral Modification Strategies today: increase H2O intake, no skipping meals, increasing lean protein intake, decreasing simple carbohydrates, increasing vegetables, decrease eating out and work on meal planning and easy cooking plans  Jill Atkinson will stop her visits until further notice (no appointment for now, per patient, due to work). She was informed of the importance of frequent follow up visits to maximize her  success with intensive lifestyle modifications for her multiple health conditions.  ALLERGIES: Allergies  Allergen Reactions  . Bactrim [Sulfamethoxazole-Trimethoprim]     Lip swelling    MEDICATIONS: Current Outpatient Medications on File Prior to Visit  Medication Sig Dispense Refill  . albuterol (VENTOLIN HFA) 108 (90 Base) MCG/ACT inhaler Inhale 2  puffs into the lungs every 6 (six) hours as needed for wheezing. 18 g 3  . buPROPion (WELLBUTRIN XL) 300 MG 24 hr tablet Take 1 tablet (300 mg total) by mouth daily. 90 tablet 1  . escitalopram (LEXAPRO) 10 MG tablet Take 1 tablet (10 mg total) by mouth daily for 30 doses. 90 tablet 3  . famotidine (PEPCID) 10 MG tablet Take 10 mg by mouth 2 (two) times daily.    . Fluticasone Propionate (FLONASE NA) Place into the nose.    . lansoprazole (PREVACID) 15 MG capsule Take 15 mg by mouth daily at 12 noon.    . Levocetirizine Dihydrochloride (XYZAL ALLERGY 24HR PO) Take by mouth daily.    Marland Kitchen lisinopril (PRINIVIL,ZESTRIL) 20 MG tablet Take 1 tablet (20 mg total) by mouth daily. 90 tablet 3  . Vitamin D, Ergocalciferol, (DRISDOL) 1.25 MG (50000 UT) CAPS capsule Take 1 capsule (50,000 Units total) by mouth every 7 (seven) days. 4 capsule 0   No current facility-administered medications on file prior to visit.     PAST MEDICAL HISTORY: Past Medical History:  Diagnosis Date  . Allergy    hay fever  . Anxiety   . Arrhythmia    AVNRT, s/p ablation Dr. Westley Gambles at [facility]  . Asthma    exercise induced  . Chicken pox   . Depression    post partum  . Food allergy   . GERD (gastroesophageal reflux disease)   . Hypertension   . IBS (irritable bowel syndrome)   . Palpitations   . Reflux esophagitis   . Shingles   . Shortness of breath   . Swelling of both lower extremities   . UTI (lower urinary tract infection)     PAST SURGICAL HISTORY: Past Surgical History:  Procedure Laterality Date  . CARDIAC ELECTROPHYSIOLOGY STUDY AND ABLATION  2012   . COLONOSCOPY WITH PROPOFOL N/A 11/02/2015   Procedure: COLONOSCOPY WITH PROPOFOL;  Surgeon: Manya Silvas, MD;  Location: [facility] ENDOSCOPY;  Service: Endoscopy;  Laterality: N/A;  . [place]   post delivery  . NASAL SINUS SURGERY  2006  . TONSILLECTOMY  1978    SOCIAL HISTORY: Social History   Tobacco Use  . Smoking  status: Never Smoker  . Smokeless tobacco: Never Used  Substance Use Topics  . Alcohol use: Yes    Alcohol/week: 2.0 standard drinks    Types: 2 Glasses of wine per week    Comment: occasional, wine  . Drug use: No    FAMILY HISTORY: Family History  Problem Relation Age of Onset  . Arthritis Mother   . Hypertension Mother   . Thyroid disease Mother   . Arthritis Father   . Alcohol abuse Maternal Grandmother   . Drug abuse Maternal Grandmother   . Arthritis Maternal Grandmother   . Stroke Maternal Grandmother   . Hypertension Maternal Grandmother   . Alcohol abuse Maternal Grandfather   . Drug abuse Maternal Grandfather   . Arthritis Maternal Grandfather   . Hypertension Maternal Grandfather     ROS: Review of Systems  Constitutional: Positive for weight loss.  Respiratory: Negative for shortness of  breath (on exertion).   Cardiovascular: Negative for chest pain.  Gastrointestinal: Negative for nausea and vomiting.  Musculoskeletal:       Negative for muscle weakness    PHYSICAL EXAM: Pt in no acute distress  RECENT LABS AND TESTS: BMET    Component Value Date/Time   NA 140 08/27/2018 1520   NA 139 08/02/2012 0954   K 4.1 08/27/2018 1520   CL 104 08/27/2018 1520   CO2 26 08/27/2018 1520   GLUCOSE 104 (H) 08/27/2018 1520   BUN 15 08/27/2018 1520   BUN 8 08/02/2012 0954   CREATININE 0.88 08/27/2018 1520   CALCIUM 9.6 08/27/2018 1520   GFRNONAA 103 08/02/2012 0954   GFRAA 118 08/02/2012 0954   Lab Results  Component Value Date   HGBA1C 5.7 (H) 10/30/2018   HGBA1C 5.3 08/24/2017   HGBA1C 5.5 08/11/2015   Lab Results  Component Value Date   INSULIN 18.3 10/30/2018   CBC    Component Value Date/Time   WBC 5.1 08/24/2017 0922   RBC 4.71 08/24/2017 0922   HGB 15.2 (H) 08/24/2017 0922   HCT 45.4 08/24/2017 0922   PLT 331.0 08/24/2017 0922   MCV 96.3 08/24/2017 0922   MCH 29.0 08/02/2012 0954   MCHC 33.5 08/24/2017 0922   RDW 12.8 08/24/2017 0922    RDW 14.4 08/02/2012 0954   LYMPHSABS 1.7 08/24/2017 0922   LYMPHSABS 1.5 08/02/2012 0954   MONOABS 0.5 08/24/2017 0922   EOSABS 0.1 08/24/2017 0922   EOSABS 0.1 08/02/2012 0954   BASOSABS 0.0 08/24/2017 0922   BASOSABS 0.0 08/02/2012 0954   Iron/TIBC/Ferritin/ %Sat No results found for: IRON, TIBC, FERRITIN, IRONPCTSAT Lipid Panel     Component Value Date/Time   CHOL 225 (H) 10/30/2018 0901   TRIG 109 10/30/2018 0901   HDL 61 10/30/2018 0901   CHOLHDL 4 08/24/2017 0922   VLDL 21.8 08/24/2017 0922   LDLCALC 142 (H) 10/30/2018 0901   LDLDIRECT 142.3 08/06/2013 0911   Hepatic Function Panel     Component Value Date/Time   PROT 6.9 08/27/2018 1520   PROT 6.8 08/02/2012 0954   ALBUMIN 4.5 08/27/2018 1520   ALBUMIN 4.4 08/02/2012 0954   AST 19 08/27/2018 1520   ALT 23 08/27/2018 1520   ALKPHOS 76 08/27/2018 1520   BILITOT 0.4 08/27/2018 1520      Component Value Date/Time   TSH 2.15 08/27/2018 1520   TSH 1.16 08/24/2017 0922   TSH 1.85 08/11/2015 0909    Results for SURENA, WELGE (MRN [id number]) as of 01/23/2019 15:24  Ref. Range 10/30/2018 09:01  Vitamin D, 25-Hydroxy Latest Ref Range: 30.0 - 100.0 ng/mL 17.8 (L)    I, Doreene Nest, am acting as Location manager for [employer]. Owens Shark, DO  I have reviewed the above documentation for accuracy and completeness, and I agree with the above. -Jearld Lesch, DO

## 2019-01-29 ENCOUNTER — Other Ambulatory Visit: Payer: Self-pay | Admitting: Family

## 2019-01-29 DIAGNOSIS — I10 Essential (primary) hypertension: Secondary | ICD-10-CM

## 2019-01-29 NOTE — Telephone Encounter (Signed)
Sent to PCP to advise alterative medication is on backorder.

## 2019-01-30 ENCOUNTER — Encounter (INDEPENDENT_AMBULATORY_CARE_PROVIDER_SITE_OTHER): Payer: Self-pay

## 2019-01-30 ENCOUNTER — Other Ambulatory Visit (INDEPENDENT_AMBULATORY_CARE_PROVIDER_SITE_OTHER): Payer: Self-pay | Admitting: Bariatrics

## 2019-01-30 DIAGNOSIS — E559 Vitamin D deficiency, unspecified: Secondary | ICD-10-CM

## 2019-01-30 NOTE — Telephone Encounter (Signed)
New refill sent

## 2019-02-25 ENCOUNTER — Encounter: Payer: Self-pay | Admitting: Family

## 2019-02-25 DIAGNOSIS — F32A Depression, unspecified: Secondary | ICD-10-CM

## 2019-02-25 DIAGNOSIS — F329 Major depressive disorder, single episode, unspecified: Secondary | ICD-10-CM

## 2019-02-25 MED ORDER — BUPROPION HCL ER (XL) 300 MG PO TB24: 300.0000 mg | ORAL_TABLET | Freq: Every day | ORAL | 1 refills | Status: DC

## 2019-02-28 ENCOUNTER — Other Ambulatory Visit: Payer: Self-pay | Admitting: Family

## 2019-02-28 DIAGNOSIS — I10 Essential (primary) hypertension: Secondary | ICD-10-CM

## 2019-03-26 ENCOUNTER — Telehealth: Payer: BLUE CROSS/BLUE SHIELD | Admitting: Physician Assistant

## 2019-03-26 DIAGNOSIS — R300 Dysuria: Secondary | ICD-10-CM | POA: Diagnosis not present

## 2019-03-26 MED ORDER — CEPHALEXIN 500 MG PO CAPS: 500.0000 mg | ORAL_CAPSULE | Freq: Two times a day (BID) | ORAL | 0 refills | Status: AC

## 2019-03-26 NOTE — Addendum Note (Signed)
Addended by: Tereasa Coop on: 03/26/2019 11:41 AM   Modules accepted: Orders

## 2019-03-26 NOTE — Progress Notes (Signed)
We are sorry that you are not feeling well.  Here is how we plan to help!  Based on what you shared with me it looks like you most likely have a simple urinary tract infection.  A UTI (Urinary Tract Infection) is a bacterial infection of the bladder.  Most cases of urinary tract infections are simple to treat but a key part of your care is to encourage you to drink plenty of fluids and watch your symptoms carefully.  I have prescribed Keflex 500 mg twice a day for 7 days.  Your symptoms should gradually improve. Call us if the burning in your urine worsens, you develop worsening fever, back pain or pelvic pain or if your symptoms do not resolve after completing the antibiotic.  Urinary tract infections can be prevented by drinking plenty of water to keep your body hydrated.  Also be sure when you wipe, wipe from front to back and don't hold it in!  If possible, empty your bladder every 4 hours.  Your e-visit answers were reviewed by a board certified advanced clinical practitioner to complete your personal care plan.  Depending on the condition, your plan could have included both over the counter or prescription medications.  If there is a problem please reply once you have received a response from your provider.  Your safety is important to [place].  If you have drug allergies check your prescription carefully.    You can use MyChart to ask questions about today's visit, request a non-urgent call back, or ask for a work or school excuse for 24 hours related to this e-Visit. If it has been greater than 24 hours you will need to follow up with your provider, or enter a new e-Visit to address those concerns.   You will get an e-mail in the next two days asking about your experience.  I hope that your e-visit has been valuable and will speed your recovery. Thank you for using e-visits.    ===View-only below this line===   ----- Message -----    From: [name]    Sent: 03/26/2019 11:16  AM EDT      To: E-Visit Mailing List Subject: E-Visit Submission: Urinary Problems  E-Visit Submission: Urinary Problems --------------------------------  Question: Which of the following are you experiencing? Answer:   Pain while passing urine            Change in urine appearance or smell  Question: When you have pain when passing urine, which of these apply? Answer:   The pain feels like it is on the inside  Question: Are you able to pass urine? Answer:   Yes, I can pass urine.  Question: How long have you had pain or difficulty passing urine? Answer:   More  than two days but less than one week  Question: Do you have a fever? Answer:   No, I do not have a fever  Question: Do you have any of the following? Answer:   I have belly pain with this illness  Question: Do you have an exaggerated sensation of the need to pass urine? Answer:   Yes, the sensation is exaggerated  Question: Do you have the urge to urinate more of less frequently than normal? Answer:   More frequently  Question: What does your urine look like? Answer:   It is cloudy  Question: Do you have any of the following? Answer:   None of the above  Question: Do you have any of the  following? Answer:   No discharge  Question: Do you have any sores on your genitals? Answer:   No  Question: Do you have any history of kidney dysfunction or kidney problems? Answer:   No  Question: Within the past 3 months, have you had any surgery on your kidneys or bladder, or have you had a tube inserted to collect your urine? Answer:   No, I have never had either  Question: Have you had similar symptoms in the past? Answer:   Yes, I have had similar symptoms before  Question: If you had similar symptoms in the past, did any of the following work? Answer:   Pills for urine infection  Question: Please list any additional comments  Answer:   I had a UTI about 7 years ago - the symptoms I'm experiencing now are the  same.  Question: Please list your medication allergies that you may have ? (If 'none' , please list as 'none') Answer:   Allergic to sulfa antibiotics  A total of 5-10 minutes was spent evaluating this patients questionnaire and formulating a plan of care.

## 2019-06-15 ENCOUNTER — Other Ambulatory Visit: Payer: Self-pay | Admitting: Family

## 2019-06-15 DIAGNOSIS — F32A Depression, unspecified: Secondary | ICD-10-CM

## 2019-06-15 DIAGNOSIS — F329 Major depressive disorder, single episode, unspecified: Secondary | ICD-10-CM

## 2019-06-19 ENCOUNTER — Ambulatory Visit (INDEPENDENT_AMBULATORY_CARE_PROVIDER_SITE_OTHER): Payer: BC Managed Care – PPO

## 2019-06-19 ENCOUNTER — Other Ambulatory Visit: Payer: Self-pay

## 2019-06-19 ENCOUNTER — Ambulatory Visit
Admission: EM | Admit: 2019-06-19 | Discharge: 2019-06-19 | Disposition: A | Discharge status: Home / Self Care | Payer: BC Managed Care – PPO | Attending: Family | Admitting: Family

## 2019-06-19 DIAGNOSIS — M5442 Lumbago with sciatica, left side: Secondary | ICD-10-CM

## 2019-06-19 DIAGNOSIS — M545 Low back pain: Secondary | ICD-10-CM | POA: Diagnosis not present

## 2019-06-19 DIAGNOSIS — W19XXXA Unspecified fall, initial encounter: Secondary | ICD-10-CM

## 2019-06-19 DIAGNOSIS — M62830 Muscle spasm of back: Secondary | ICD-10-CM

## 2019-06-19 DIAGNOSIS — Y92009 Unspecified place in unspecified non-institutional (private) residence as the place of occurrence of the external cause: Secondary | ICD-10-CM | POA: Diagnosis not present

## 2019-06-19 MED ORDER — DICLOFENAC SODIUM 75 MG PO TBEC: 75.0000 mg | DELAYED_RELEASE_TABLET | Freq: Two times a day (BID) | ORAL | 0 refills | Status: DC | PRN

## 2019-06-19 MED ORDER — CYCLOBENZAPRINE HCL 10 MG PO TABS: ORAL_TABLET | ORAL | 0 refills | Status: DC

## 2019-06-19 MED ORDER — KETOROLAC TROMETHAMINE 60 MG/2ML IM SOLN
60.0000 mg | Freq: Once | INTRAMUSCULAR | Status: AC
  Administered 2019-06-19: 60 mg via INTRAMUSCULAR

## 2019-06-19 NOTE — ED Provider Notes (Signed)
[facility]    CSN: [id number] Arrival date & time: 06/19/19  [id number]      History   Chief Complaint Chief Complaint  Patient presents with  . Fall    HPI Jill Atkinson is a 53 y.o. female.   53 year old female accompanied by her husband with concern over injury to her lower back. She fell in her bathroom last evening and hit her left side of her lower back on the bathroom scale. She was trying to get up but was in severe pain and passed out due to the pain. Her husband got her back to bed last night and she continues to have severe pain in her lower back and down her left leg this morning. She has not applied any ice/heat or taken any medications for pain. Denies any distinct numbness down her legs. Able to urinate this morning and had diarrhea last night.  Having a hard time changing positions and is currently sitting in a wheel chair. No previous injury to her back. Other chronic health issues include HTN, mood disorder, and environmental allergies. Currently takes Lisinopril, Wellbutrin, Lexapro, Xyzal, and Pepcid daily and Flonase and Albuterol inhaler prn.   The history is provided by the patient and the spouse.  Fall This is a new problem. The current episode started 12 to 24 hours ago. The problem occurs constantly. The problem has been gradually worsening. Pertinent negatives include no chest pain, no headaches and no shortness of breath. The symptoms are aggravated by twisting, bending, walking and standing. Nothing relieves the symptoms. She has tried nothing for the symptoms.    Past Medical History:  Diagnosis Date  . Allergy    hay fever  . Anxiety   . Arrhythmia    AVNRT, s/p ablation Dr. Westley Gambles at [facility]  . Asthma    exercise induced  . Chicken pox   . Depression    post partum  . Food allergy   . GERD (gastroesophageal reflux disease)   . Hypertension   . IBS (irritable bowel syndrome)   . Palpitations   . Reflux esophagitis   . Shingles   .  Shortness of breath   . Swelling of both lower extremities   . UTI (lower urinary tract infection)     Patient Active Problem List   Diagnosis Date Noted  . Prediabetes 11/14/2018  . Class 1 obesity with serious comorbidity and body mass index (BMI) of 30.0 to 30.9 in adult 11/01/2018  . GERD (gastroesophageal reflux disease) 10/29/2018  . Cellulitis of right lower extremity 04/05/2017  . Sinus congestion 10/26/2016  . Cough 08/08/2016  . Depression 05/30/2016  . Hypertension 08/11/2015  . Screening for breast cancer 08/11/2015  . Special screening for malignant neoplasms, colon 08/11/2015  . Menopausal disorder 09/27/2013  . Routine general medical examination at a health care facility 08/06/2013  . Exercise-induced asthma 08/02/2012  . History of prior ablation treatment 08/02/2012    Past Surgical History:  Procedure Laterality Date  . CARDIAC ELECTROPHYSIOLOGY STUDY AND ABLATION  2012   . COLONOSCOPY WITH PROPOFOL N/A 11/02/2015   Procedure: COLONOSCOPY WITH PROPOFOL;  Surgeon: Manya Silvas, MD;  Location: [facility] ENDOSCOPY;  Service: Endoscopy;  Laterality: N/A;  . [place]   post delivery  . NASAL SINUS SURGERY  2006  . TONSILLECTOMY  1978    OB History    Gravida  2   Para  2   Term  Preterm      AB      Living        SAB      TAB      Ectopic      Multiple      Live Births               Home Medications    Prior to Admission medications   Medication Sig Start Date End Date Taking? Authorizing Provider  buPROPion (WELLBUTRIN XL) 300 MG 24 hr tablet Take 1 tablet (300 mg total) by mouth daily. 02/25/19  Yes Arnett, Yvetta Coder, FNP  escitalopram (LEXAPRO) 10 MG tablet TAKE 1 TABLET (10 MG TOTAL) BY MOUTH DAILY. 06/17/19  Yes Burnard Hawthorne, FNP  lisinopril (ZESTRIL) 10 MG tablet TAKE 2 TABLETS BY MOUTH EVERY DAY 02/28/19  Yes Arnett, Yvetta Coder, FNP  albuterol (VENTOLIN HFA) 108 (90 Base) MCG/ACT inhaler  Inhale 2 puffs into the lungs every 6 (six) hours as needed for wheezing. 08/29/18   Burnard Hawthorne, FNP  cyclobenzaprine (FLEXERIL) 10 MG tablet Take 1/2 to 1 whole tablet by mouth every 8 hours as needed for muscle spasms/pain. 06/19/19   Katy Apo, NP  diclofenac (VOLTAREN) 75 MG EC tablet Take 1 tablet (75 mg total) by mouth 2 (two) times daily as needed for moderate pain. 06/19/19   Katy Apo, NP  famotidine (PEPCID) 10 MG tablet Take 10 mg by mouth 2 (two) times daily.    [provider]  Fluticasone Propionate (FLONASE NA) Place into the nose.    [provider]  lansoprazole (PREVACID) 15 MG capsule Take 15 mg by mouth daily at 12 noon.    [provider]  Levocetirizine Dihydrochloride (XYZAL ALLERGY 24HR PO) Take by mouth daily.    [provider]  Vitamin D, Ergocalciferol, (DRISDOL) 1.25 MG (50000 UT) CAPS capsule Take 1 capsule (50,000 Units total) by mouth every 7 (seven) days. 01/10/19   Georgia Lopes, DO    Family History Family History  Problem Relation Age of Onset  . Arthritis Mother   . Hypertension Mother   . Thyroid disease Mother   . Arthritis Father   . Alcohol abuse Maternal Grandmother   . Drug abuse Maternal Grandmother   . Arthritis Maternal Grandmother   . Stroke Maternal Grandmother   . Hypertension Maternal Grandmother   . Alcohol abuse Maternal Grandfather   . Drug abuse Maternal Grandfather   . Arthritis Maternal Grandfather   . Hypertension Maternal Grandfather     Social History Social History   Tobacco Use  . Smoking status: Never Smoker  . Smokeless tobacco: Never Used  Substance Use Topics  . Alcohol use: Yes    Alcohol/week: 2.0 standard drinks    Types: 2 Glasses of wine per week    Comment: occasional, wine  . Drug use: No     Allergies   Bactrim [sulfamethoxazole-trimethoprim]   Review of Systems Review of Systems  Constitutional: Positive for activity change. Negative for  chills, diaphoresis and fever.  Eyes: Negative for photophobia and visual disturbance.  Respiratory: Negative for chest tightness and shortness of breath.   Cardiovascular: Negative for chest pain.  Gastrointestinal: Positive for diarrhea. Negative for vomiting.  Genitourinary: Negative for decreased urine volume, difficulty urinating, dysuria, hematuria and pelvic pain.  Musculoskeletal: Positive for arthralgias, back pain, gait problem, myalgias and neck pain (neck is sore). Negative for neck stiffness.  Skin: Negative for wound.  Allergic/Immunologic: Positive for  environmental allergies. Negative for immunocompromised state.  Neurological: Positive for syncope (due to pain last night). Negative for dizziness, seizures, weakness, light-headedness, numbness and headaches.  Hematological: Negative for adenopathy. Does not bruise/bleed easily.     Physical Exam Triage Vital Signs ED Triage Vitals  Enc Vitals Group     BP 06/19/19 0834 121/80     Pulse Rate 06/19/19 0834 62     Resp 06/19/19 0834 18     Temp 06/19/19 0834 (!) 97.5 F (36.4 C)     Temp Source 06/19/19 0834 Oral     SpO2 06/19/19 0834 100 %     Weight 06/19/19 0837 156 lb (70.8 kg)     Height --      Head Circumference --      Peak Flow --      Pain Score 06/19/19 0837 4     Pain Loc --      Pain Edu? --      Excl. in [place]? --    No data found.  Updated Vital Signs BP 121/80 (BP Location: Left Arm)   Pulse 62   Temp (!) 97.5 F (36.4 C) (Oral)   Resp 18   Wt 156 lb (70.8 kg)   LMP 05/26/2016 Comment: preg test negative  SpO2 100%   BMI 25.18 kg/m   Visual Acuity Right Eye Distance:   Left Eye Distance:   Bilateral Distance:    Right Eye Near:   Left Eye Near:    Bilateral Near:     Physical Exam Vitals signs and nursing note reviewed.  Constitutional:      General: She is awake. She is in acute distress.     Appearance: She is well-developed and well-groomed.     Comments: Patient sitting in  wheel chair and appears in significant pain and has difficulty changing positions due to the pain.   HENT:     Head: Normocephalic and atraumatic.     Right Ear: External ear normal.     Left Ear: External ear normal.  Eyes:     Extraocular Movements: Extraocular movements intact.     Conjunctiva/sclera: Conjunctivae normal.  Neck:     Musculoskeletal: Neck supple. Normal range of motion. Muscular tenderness present. No neck rigidity.      Comments: Full range of motion of neck but tender on left trapezius muscle area.  Cardiovascular:     Rate and Rhythm: Normal rate and regular rhythm.  Pulmonary:     Effort: Pulmonary effort is normal. No respiratory distress.     Breath sounds: Normal breath sounds and air entry. No stridor. No decreased breath sounds, wheezing, rhonchi or rales.  Musculoskeletal:        General: Tenderness and signs of injury present.     Lumbar back: She exhibits decreased range of motion, tenderness, swelling, pain and spasm. She exhibits no deformity.       Back:     Comments: Having a very difficult time changing positions. Increase in pain with any movement of her left leg. Very tender along the left mid to lower lumbar external oblique and upper gluteal muscle groups. Spasms present. Unable to visualize skin due to patient wearing a dress and unable to move to reveal skin without significant pain. No tenderness on right side or upper thoracic area. Good muscle strength in lower legs and good distal pulses. No neuro deficits noted.   Skin:    General: Skin is warm and dry.  Capillary Refill: Capillary refill takes less than 2 seconds.  Neurological:     General: No focal deficit present.     Mental Status: She is alert and oriented to person, place, and time.     Sensory: Sensation is intact. No sensory deficit.     Motor: Motor function is intact. No weakness, tremor or abnormal muscle tone.     Gait: Gait abnormal.     Comments: SLR positive left >  right   Psychiatric:        Attention and Perception: Attention normal.        Mood and Affect: Mood normal. Affect is tearful (due to pain).        Speech: Speech normal.        Behavior: Behavior normal. Behavior is cooperative.        Thought Content: Thought content normal.        Cognition and Memory: Cognition normal.        Judgment: Judgment normal.      UC Treatments / Results  Labs (all labs ordered are listed, but only abnormal results are displayed) Labs Reviewed - No data to display  EKG   Radiology Dg Lumbar Spine Complete  Result Date: 06/19/2019 CLINICAL DATA:  53 year old female with fall and trauma to the left side of the low back. Patient presenting with low back pain. EXAM: LUMBAR SPINE - COMPLETE 4+ VIEW COMPARISON:  None. FINDINGS: There is no acute fracture or subluxation of the lumbar spine. The vertebral body heights and disc spaces are preserved. The visualized posterior elements appear intact. The soft tissues are unremarkable. IMPRESSION: Negative. Electronically Signed   By: Anner Crete M.D.   On: 06/19/2019 09:51    Procedures Procedures (including critical care time)  Medications Ordered in UC Medications  ketorolac (TORADOL) injection 60 mg (60 mg Intramuscular Given 06/19/19 0913)    Initial Impression / Assessment and Plan / UC Course  I have reviewed the triage vital signs and the nursing notes.  Pertinent labs & imaging results that were available during my care of the patient were reviewed by me and considered in my medical decision making (see chart for details).    Gave Toradol 60mg  IM now to help with pain. Patient indicated slight improvement in pain after 30 minutes. Reviewed x-ray results with patient and husband. No distinct fracture or compression seen.  Discussed that she probably has a bad contusion/bruise to her lower back muscles with spasms. Recommend apply ice to area and alternate with heat for comfort. Start Voltaren  75mg  twice a day as directed. Use Flexeril 10mg  1/2 to 1 whole tablet every 8 hours as needed for muscle spasms- will cause drowsiness. Change positions slowly. If any increased pain, numbness of loss of bladder or bowel control occur, go to the ER ASAP. Otherwise, follow-up in 4 to 5 days with her PCP if not resolving.  Final Clinical Impressions(s) / UC Diagnoses   Final diagnoses:  Fall in home, initial encounter  Acute left-sided low back pain with left-sided sciatica  Spasm of muscle of lower back     Discharge Instructions     You were given a shot of Toradol today to help with pain. You may start Voltaren 75mg  twice a day as needed for pain and inflammation. Recommend take Flexeril 10mg  tablets- take 1/2 to 1 whole tablet every 8 hours as needed for pain and muscle spasms. Apply ice and alternate with heat as needed for comfort. If increased  pain, numbness or any loss of bladder or bowel control occur, go to the ER ASAP. Otherwise, follow-up with your PCP in 4 to 5 days for recheck if not resolving.     ED Prescriptions    Medication Sig Dispense Auth. Provider   diclofenac (VOLTAREN) 75 MG EC tablet Take 1 tablet (75 mg total) by mouth 2 (two) times daily as needed for moderate pain. 30 tablet Katy Apo, NP   cyclobenzaprine (FLEXERIL) 10 MG tablet Take 1/2 to 1 whole tablet by mouth every 8 hours as needed for muscle spasms/pain. 20 tablet Katy Apo, NP     Controlled Substance Prescriptions [place] Controlled Substance Registry consulted? Not Applicable   Katy Apo, NP 06/20/19 [phone]

## 2019-06-19 NOTE — ED Triage Notes (Signed)
Pt States she was going to the bathroom last night and fell over a towel and fell and hit her back on the bathroom scale, was in the process of trying to get up and passed out due to the pain. Pain in the middle back and radiates down her left leg, currently in a wheelchair. States pain is worse when she moves her left leg. No otc meds tried.

## 2019-06-19 NOTE — Discharge Instructions (Addendum)
You were given a shot of Toradol today to help with pain. You may start Voltaren 75mg  twice a day as needed for pain and inflammation. Recommend take Flexeril 10mg  tablets- take 1/2 to 1 whole tablet every 8 hours as needed for pain and muscle spasms. Apply ice and alternate with heat as needed for comfort. If increased pain, numbness or any loss of bladder or bowel control occur, go to the ER ASAP. Otherwise, follow-up with your PCP in 4 to 5 days for recheck if not resolving.

## 2019-07-08 ENCOUNTER — Encounter: Payer: Self-pay | Admitting: Family Medicine

## 2019-07-08 ENCOUNTER — Other Ambulatory Visit: Payer: Self-pay

## 2019-07-08 ENCOUNTER — Ambulatory Visit: Payer: BC Managed Care – PPO | Admitting: Family Medicine

## 2019-07-08 VITALS — BP 152/86 | HR 86 | Temp 96.8°F | Resp 16 | Ht 66.0 in | Wt 160.2 lb

## 2019-07-08 DIAGNOSIS — W19XXXS Unspecified fall, sequela: Secondary | ICD-10-CM | POA: Diagnosis not present

## 2019-07-08 DIAGNOSIS — M545 Low back pain, unspecified: Secondary | ICD-10-CM

## 2019-07-08 MED ORDER — METHYLPREDNISOLONE 4 MG PO TBPK: ORAL_TABLET | ORAL | 0 refills | Status: DC

## 2019-07-08 NOTE — Patient Instructions (Signed)

## 2019-07-08 NOTE — Progress Notes (Signed)
Subjective:    Patient ID: Jill Atkinson, female    DOB: June 18, 1966, 53 y.o.   MRN: [id number]  HPI   Patient presents to clinic due to continued left-sided low back pain after fall.  Fell on 06/19/2019 in her bathroom, slipped and hit elbow on side of bathtub and corner of scale dug into left-side of low back.  She did go to the urgent care following this fall, x-ray to spine revealed no fracture.  Was given IM Toradol at the urgent care and sent home with ibuprofen and Flexeril.  She took about 1 week off of walking, but has slowly been building up her walking stamina.  Continues to have pain in left low back, would rated a 4 out of 10.  States at times the pain can be a 7 out of 10 if she turns a certain way.  Denies loss of bowel or bladder control.  Denies numbness or tingling in her lower extremities.  Denies saddle anesthesia.  Patient Active Problem List   Diagnosis Date Noted  . Prediabetes 11/14/2018  . Class 1 obesity with serious comorbidity and body mass index (BMI) of 30.0 to 30.9 in adult 11/01/2018  . GERD (gastroesophageal reflux disease) 10/29/2018  . Cellulitis of right lower extremity 04/05/2017  . Sinus congestion 10/26/2016  . Cough 08/08/2016  . Depression 05/30/2016  . Hypertension 08/11/2015  . Screening for breast cancer 08/11/2015  . Special screening for malignant neoplasms, colon 08/11/2015  . Menopausal disorder 09/27/2013  . Routine general medical examination at a health care facility 08/06/2013  . Exercise-induced asthma 08/02/2012  . History of prior ablation treatment 08/02/2012   Social History   Tobacco Use  . Smoking status: Never Smoker  . Smokeless tobacco: Never Used  Substance Use Topics  . Alcohol use: Yes    Alcohol/week: 2.0 standard drinks    Types: 2 Glasses of wine per week    Comment: occasional, wine    Review of Systems  Constitutional: Negative for chills, fatigue and fever.  HENT: Negative for congestion, ear pain,  sinus pain and sore throat.   Eyes: Negative.   Respiratory: Negative for cough, shortness of breath and wheezing.   Cardiovascular: Negative for chest pain, palpitations and leg swelling.  Gastrointestinal: Negative for abdominal pain, diarrhea, nausea and vomiting.  Genitourinary: Negative for dysuria, frequency and urgency.  Musculoskeletal: +left sided low back pain Skin: Negative for color change, pallor and rash.  Neurological: Negative for syncope, light-headedness and headaches.  Psychiatric/Behavioral: The patient is not nervous/anxious.       Objective:   Physical Exam Vitals signs and nursing note reviewed.  Constitutional:      General: She is not in acute distress.    Appearance: She is not ill-appearing, toxic-appearing or diaphoretic.  HENT:     Head: Normocephalic and atraumatic.  Eyes:     General: No scleral icterus.    Extraocular Movements: Extraocular movements intact.     Conjunctiva/sclera: Conjunctivae normal.     Pupils: Pupils are equal, round, and reactive to light.  Neck:     Musculoskeletal: Normal range of motion. No neck rigidity.  Cardiovascular:     Rate and Rhythm: Normal rate and regular rhythm.     Heart sounds: Normal heart sounds.  Pulmonary:     Effort: Pulmonary effort is normal.     Breath sounds: Normal breath sounds.  Musculoskeletal:       Back:     Right lower leg: No  edema.     Left lower leg: No edema.     Comments: Location of pain indicated by red square on diagram  Skin:    General: Skin is warm and dry.     Coloration: Skin is not jaundiced or pale.     Findings: No bruising (no visible bruising).  Neurological:     General: No focal deficit present.     Mental Status: She is alert and oriented to person, place, and time.     Cranial Nerves: No cranial nerve deficit.     Motor: No weakness.     Gait: Gait normal.  Psychiatric:        Mood and Affect: Mood normal.        Behavior: Behavior normal.    Today's  Vitals   07/08/19 1023  BP: (!) 152/86  Pulse: 86  Resp: 16  Temp: (!) 96.8 F (36 C)  TempSrc: Temporal  SpO2: 98%  Weight: 160 lb 3.2 oz (72.7 kg)  Height: 5\' 6"  (1.676 m)   Body mass index is 25.86 kg/m.     Assessment & Plan:   She will take prednisone taper and continue Flexeril as needed at home.  Handout given outlining at home exercises she can do to help reduce pain and also have placed referral to physical therapy.  Advised patient that with a severe fall, often it can take many weeks to fully recover.  Physical therapy will help a lot with pain reduction & gaining strength.  Advised to call office right away if pain worsens or develops any new or concerning symptoms such as numbness in lower extremities, loss of bowel or bladder control or saddle anesthesia.  1. Fall, sequela  - methylPREDNISolone (MEDROL DOSEPAK) 4 MG TBPK tablet; Take according to pack instructions  Dispense: 21 tablet; Refill: 0 - Ambulatory referral to Physical Therapy  2. Acute left-sided low back pain without sciatica  - methylPREDNISolone (MEDROL DOSEPAK) 4 MG TBPK tablet; Take according to pack instructions  Dispense: 21 tablet; Refill: 0 - Ambulatory referral to Physical Therapy   Patient will keep all regular follow-ups as scheduled.  She will return to clinic sooner if any issues arise.

## 2019-07-23 ENCOUNTER — Ambulatory Visit: Payer: BC Managed Care – PPO

## 2019-08-30 ENCOUNTER — Encounter: Payer: Self-pay | Admitting: Family

## 2019-09-17 DIAGNOSIS — M5412 Radiculopathy, cervical region: Secondary | ICD-10-CM | POA: Diagnosis not present

## 2019-09-17 DIAGNOSIS — M62830 Muscle spasm of back: Secondary | ICD-10-CM | POA: Diagnosis not present

## 2019-09-17 DIAGNOSIS — M9902 Segmental and somatic dysfunction of thoracic region: Secondary | ICD-10-CM | POA: Diagnosis not present

## 2019-09-17 DIAGNOSIS — M9901 Segmental and somatic dysfunction of cervical region: Secondary | ICD-10-CM | POA: Diagnosis not present

## 2019-10-01 DIAGNOSIS — M62830 Muscle spasm of back: Secondary | ICD-10-CM | POA: Diagnosis not present

## 2019-10-01 DIAGNOSIS — M5412 Radiculopathy, cervical region: Secondary | ICD-10-CM | POA: Diagnosis not present

## 2019-10-01 DIAGNOSIS — M9902 Segmental and somatic dysfunction of thoracic region: Secondary | ICD-10-CM | POA: Diagnosis not present

## 2019-10-01 DIAGNOSIS — M9901 Segmental and somatic dysfunction of cervical region: Secondary | ICD-10-CM | POA: Diagnosis not present

## 2019-10-04 ENCOUNTER — Encounter: Payer: Self-pay | Admitting: Family

## 2019-10-04 ENCOUNTER — Other Ambulatory Visit: Payer: Self-pay

## 2019-10-04 ENCOUNTER — Ambulatory Visit (INDEPENDENT_AMBULATORY_CARE_PROVIDER_SITE_OTHER): Payer: BC Managed Care – PPO | Admitting: Family

## 2019-10-04 VITALS — Ht 66.0 in | Wt 153.0 lb

## 2019-10-04 DIAGNOSIS — J45990 Exercise induced bronchospasm: Secondary | ICD-10-CM | POA: Diagnosis not present

## 2019-10-04 DIAGNOSIS — Z0000 Encounter for general adult medical examination without abnormal findings: Secondary | ICD-10-CM | POA: Diagnosis not present

## 2019-10-04 DIAGNOSIS — F331 Major depressive disorder, recurrent, moderate: Secondary | ICD-10-CM

## 2019-10-04 DIAGNOSIS — I10 Essential (primary) hypertension: Secondary | ICD-10-CM | POA: Diagnosis not present

## 2019-10-04 DIAGNOSIS — K219 Gastro-esophageal reflux disease without esophagitis: Secondary | ICD-10-CM | POA: Diagnosis not present

## 2019-10-04 NOTE — Assessment & Plan Note (Signed)
Mammogram overdue, patient understands to schedule.  Advised patient to repeat Pap smear early 2021.  Emphasized the importance of SBE at home.  Patient will schedule labs

## 2019-10-04 NOTE — Assessment & Plan Note (Signed)
Appears controlled, discussed red flag symptoms which would warrant a prompt EGD.  I did advise her however in absence of red flag symptoms, that she should ask for EGD with colonoscopy in 2022

## 2019-10-04 NOTE — Assessment & Plan Note (Signed)
May be elevated above goal < 120/80; patient will send log and we will adjust from there

## 2019-10-04 NOTE — Progress Notes (Signed)
Virtual Visit via Video Note  I connected with@  on 10/04/19 at 10:00 AM EST by a video enabled telemedicine application and verified that I am speaking with the correct person using two identifiers.  Location patient: home Location provider:home office Persons participating in the virtual visit: patient, provider  I discussed the limitations of evaluation and management by telemedicine and the availability of in person appointments. The patient expressed understanding and agreed to proceed.   HPI:  Virtual CPE  Has lost 40lbs with program, NOOM which included education, food diary.   Continues to have some low back pain. Can feel the pain during jogging. No numbness, weakness, trouble urinating or bowel movements.   HTN- at home ,130 /89. ON 20mg  lisinorpil NO CP,SOB  Depression- feels like irritably, less negative thoughts. Feels well on regimen.   Asthma- well controlled overall; may use inhaler during cold.   GERD-feels well on pravastatin, no breakthrough symptoms.  Cough resolved medical medication.  No trouble swallowing or pain with swallowing  SBE; mammogram 2 years overdue.  Up-to-date colonoscopy, repeat 2022 Pap smear shows benign reparative changes, negative malignancy and HPV; due for repeat pap smear   ROS: See pertinent positives and negatives per HPI.  Past Medical History:  Diagnosis Date  . Allergy    hay fever  . Anxiety   . Arrhythmia    AVNRT, s/p ablation Dr. Westley Gambles at [facility]  . Asthma    exercise induced  . Chicken pox   . Depression    post partum  . Food allergy   . GERD (gastroesophageal reflux disease)   . Hypertension   . IBS (irritable bowel syndrome)   . Palpitations   . Reflux esophagitis   . Shingles   . Shortness of breath   . Swelling of both lower extremities   . UTI (lower urinary tract infection)     Past Surgical History:  Procedure Laterality Date  . CARDIAC ELECTROPHYSIOLOGY STUDY AND ABLATION  2012   . COLONOSCOPY  WITH PROPOFOL N/A 11/02/2015   Procedure: COLONOSCOPY WITH PROPOFOL;  Surgeon: Manya Silvas, MD;  Location: [facility] ENDOSCOPY;  Service: Endoscopy;  Laterality: N/A;  . Dean   post delivery  . NASAL SINUS SURGERY  2006  . TONSILLECTOMY  1978    Family History  Problem Relation Age of Onset  . Arthritis Mother   . Hypertension Mother   . Thyroid disease Mother   . Arthritis Father   . Alcohol abuse Maternal Grandmother   . Drug abuse Maternal Grandmother   . Arthritis Maternal Grandmother   . Stroke Maternal Grandmother   . Hypertension Maternal Grandmother   . Alcohol abuse Maternal Grandfather   . Drug abuse Maternal Grandfather   . Arthritis Maternal Grandfather   . Hypertension Maternal Grandfather     SOCIAL HX: Never smoker   Current Outpatient Medications:  .  albuterol (VENTOLIN HFA) 108 (90 Base) MCG/ACT inhaler, Inhale 2 puffs into the lungs every 6 (six) hours as needed for wheezing., Disp: 18 g, Rfl: 3 .  buPROPion (WELLBUTRIN XL) 300 MG 24 hr tablet, Take 1 tablet (300 mg total) by mouth daily., Disp: 90 tablet, Rfl: 1 .  escitalopram (LEXAPRO) 10 MG tablet, TAKE 1 TABLET (10 MG TOTAL) BY MOUTH DAILY., Disp: 90 tablet, Rfl: 1 .  famotidine (PEPCID) 10 MG tablet, Take 10 mg by mouth 2 (two) times daily., Disp: , Rfl:  .  Fluticasone Propionate (FLONASE NA), Place  into the nose., Disp: , Rfl:  .  Levocetirizine Dihydrochloride (XYZAL ALLERGY 24HR PO), Take by mouth daily., Disp: , Rfl:  .  lisinopril (ZESTRIL) 10 MG tablet, TAKE 2 TABLETS BY MOUTH EVERY DAY, Disp: 180 tablet, Rfl: 1 .  cyclobenzaprine (FLEXERIL) 10 MG tablet, Take 1/2 to 1 whole tablet by mouth every 8 hours as needed for muscle spasms/pain., Disp: 20 tablet, Rfl: 0 .  diclofenac (VOLTAREN) 75 MG EC tablet, Take 1 tablet (75 mg total) by mouth 2 (two) times daily as needed for moderate pain., Disp: 30 tablet, Rfl: 0 .  lansoprazole (PREVACID) 15 MG capsule, Take 15 mg  by mouth daily at 12 noon., Disp: , Rfl:  .  methylPREDNISolone (MEDROL DOSEPAK) 4 MG TBPK tablet, Take according to pack instructions, Disp: 21 tablet, Rfl: 0 .  Vitamin D, Ergocalciferol, (DRISDOL) 1.25 MG (50000 UT) CAPS capsule, Take 1 capsule (50,000 Units total) by mouth every 7 (seven) days., Disp: 4 capsule, Rfl: 0  EXAM:  VITALS per patient if applicable: Wt Readings from Last 3 Encounters:  10/04/19 153 lb (69.4 kg)  07/08/19 160 lb 3.2 oz (72.7 kg)  06/19/19 156 lb (70.8 kg)   BP Readings from Last 3 Encounters:  07/08/19 (!) 152/86  06/19/19 121/80  12/26/18 130/78     GENERAL: alert, oriented, appears well and in no acute distress  HEENT: atraumatic, conjunttiva clear, no obvious abnormalities on inspection of external nose and ears  NECK: normal movements of the head and neck  LUNGS: on inspection no signs of respiratory distress, breathing rate appears normal, no obvious gross SOB, gasping or wheezing  CV: no obvious cyanosis  MS: moves all visible extremities without noticeable abnormality  PSYCH/NEURO: pleasant and cooperative, no obvious depression or anxiety, speech and thought processing grossly intact  ASSESSMENT AND PLAN:  Discussed the following assessment and plan:  Routine general medical examination at a health care facility - Plan: 3D mammogram- MM SCREENING BREAST TOMO BILATERAL, CBC with Differential/Platelet, Comprehensive metabolic panel-FUTURE, Hemoglobin A1c, Lipid panel-future, TSH-future, VITAMIN D 25 Hydroxy (Vit-D Deficiency, Fractures)  Essential hypertension  Exercise-induced asthma  Gastroesophageal reflux disease, unspecified whether esophagitis present  Moderate episode of recurrent major depressive disorder ([place]) Problem List Items Addressed This Visit      Cardiovascular and Mediastinum   Hypertension    May be elevated above goal < 120/80; patient will send log and we will adjust from there        Respiratory    Exercise-induced asthma    Controlled prn inhaler        Digestive   GERD (gastroesophageal reflux disease)    Appears controlled, discussed red flag symptoms which would warrant a prompt EGD.  I did advise her however in absence of red flag symptoms, that she should ask for EGD with colonoscopy in 2022        Other   Routine general medical examination at a health care facility - Primary    Mammogram overdue, patient understands to schedule.  Advised patient to repeat Pap smear early 2021.  Emphasized the importance of SBE at home.  Patient will schedule labs      Relevant Orders   3D mammogram- MM SCREENING BREAST TOMO BILATERAL   CBC with Differential/Platelet   Comprehensive metabolic panel-FUTURE   Hemoglobin A1c   Lipid panel-future   TSH-future   VITAMIN D 25 Hydroxy (Vit-D Deficiency, Fractures)   Depression    Overall improved, continue regimen         -  we discussed possible serious and likely etiologies, options for evaluation and workup, limitations of telemedicine visit vs in person visit, treatment, treatment risks and precautions. Pt prefers to treat via telemedicine empirically rather then risking or undertaking an in person visit at this moment. Patient agrees to seek prompt in person care if worsening, new symptoms arise, or if is not improving with treatment.   I discussed the assessment and treatment plan with the patient. The patient was provided an opportunity to ask questions and all were answered. The patient agreed with the plan and demonstrated an understanding of the instructions.   The patient was advised to call back or seek an in-person evaluation if the symptoms worsen or if the condition fails to improve as anticipated.   Mable Paris, FNP

## 2019-10-04 NOTE — Assessment & Plan Note (Signed)
Overall improved, continue regimen

## 2019-10-04 NOTE — Assessment & Plan Note (Signed)
Controlled prn inhaler

## 2019-10-04 NOTE — Patient Instructions (Addendum)
Monitor blood pressure,  Goal is less than 120/80, based on newest guidelines; if persistently higher, please make sooner follow up appointment so we can recheck you blood pressure and manage medications  Repeat pap smear in 2021 Fasting labs  Please call call and schedule your 3D mammogram as discussed.   [place]  [place], [place]    Stay safe!

## 2019-10-22 DIAGNOSIS — M9901 Segmental and somatic dysfunction of cervical region: Secondary | ICD-10-CM | POA: Diagnosis not present

## 2019-10-22 DIAGNOSIS — M62830 Muscle spasm of back: Secondary | ICD-10-CM | POA: Diagnosis not present

## 2019-10-22 DIAGNOSIS — M5412 Radiculopathy, cervical region: Secondary | ICD-10-CM | POA: Diagnosis not present

## 2019-10-22 DIAGNOSIS — M9902 Segmental and somatic dysfunction of thoracic region: Secondary | ICD-10-CM | POA: Diagnosis not present

## 2019-10-29 ENCOUNTER — Encounter: Payer: Self-pay | Admitting: Family

## 2019-11-06 ENCOUNTER — Other Ambulatory Visit: Payer: Self-pay

## 2019-11-06 ENCOUNTER — Encounter: Payer: Self-pay | Admitting: Family

## 2019-11-06 DIAGNOSIS — F32A Depression, unspecified: Secondary | ICD-10-CM

## 2019-11-06 DIAGNOSIS — I10 Essential (primary) hypertension: Secondary | ICD-10-CM

## 2019-11-06 DIAGNOSIS — F329 Major depressive disorder, single episode, unspecified: Secondary | ICD-10-CM

## 2019-11-06 MED ORDER — BUPROPION HCL ER (XL) 300 MG PO TB24: 300.0000 mg | ORAL_TABLET | Freq: Every day | ORAL | 1 refills | Status: DC

## 2019-11-06 MED ORDER — LISINOPRIL 10 MG PO TABS: 20.0000 mg | ORAL_TABLET | Freq: Every day | ORAL | 1 refills | Status: DC

## 2019-11-12 DIAGNOSIS — M9902 Segmental and somatic dysfunction of thoracic region: Secondary | ICD-10-CM | POA: Diagnosis not present

## 2019-11-12 DIAGNOSIS — M62830 Muscle spasm of back: Secondary | ICD-10-CM | POA: Diagnosis not present

## 2019-11-12 DIAGNOSIS — M5412 Radiculopathy, cervical region: Secondary | ICD-10-CM | POA: Diagnosis not present

## 2019-11-12 DIAGNOSIS — M9901 Segmental and somatic dysfunction of cervical region: Secondary | ICD-10-CM | POA: Diagnosis not present

## 2019-11-22 ENCOUNTER — Ambulatory Visit
Admission: RE | Admit: 2019-11-22 | Discharge: 2019-11-22 | Disposition: A | Discharge status: Home / Self Care | Payer: BC Managed Care – PPO | Source: Ambulatory Visit | Attending: Family | Admitting: Family

## 2019-11-22 DIAGNOSIS — Z1231 Encounter for screening mammogram for malignant neoplasm of breast: Secondary | ICD-10-CM | POA: Diagnosis not present

## 2019-11-22 DIAGNOSIS — Z0000 Encounter for general adult medical examination without abnormal findings: Secondary | ICD-10-CM

## 2019-12-03 DIAGNOSIS — M9902 Segmental and somatic dysfunction of thoracic region: Secondary | ICD-10-CM | POA: Diagnosis not present

## 2019-12-03 DIAGNOSIS — M9901 Segmental and somatic dysfunction of cervical region: Secondary | ICD-10-CM | POA: Diagnosis not present

## 2019-12-03 DIAGNOSIS — M62830 Muscle spasm of back: Secondary | ICD-10-CM | POA: Diagnosis not present

## 2019-12-03 DIAGNOSIS — M5412 Radiculopathy, cervical region: Secondary | ICD-10-CM | POA: Diagnosis not present

## 2019-12-16 ENCOUNTER — Ambulatory Visit (INDEPENDENT_AMBULATORY_CARE_PROVIDER_SITE_OTHER)
Admission: RE | Admit: 2019-12-16 | Discharge: 2019-12-16 | Disposition: A | Discharge status: Home / Self Care | Payer: BC Managed Care – PPO | Source: Ambulatory Visit

## 2019-12-16 DIAGNOSIS — T7840XA Allergy, unspecified, initial encounter: Secondary | ICD-10-CM | POA: Diagnosis not present

## 2019-12-16 MED ORDER — PREDNISONE 10 MG (21) PO TBPK: ORAL_TABLET | ORAL | 0 refills | Status: DC

## 2019-12-16 NOTE — ED Provider Notes (Signed)
Virtual Visit via Video Note:  Jill Atkinson  initiated request for Telemedicine visit with [facility] Urgent Care team. I connected with Jill Atkinson  on 12/16/2019 at 11:41 AM  for a synchronized telemedicine visit using a video enabled HIPPA compliant telemedicine application. I verified that I am speaking with Jill Atkinson  using two identifiers. Orvan July, NP  was physically located in a [facility] Urgent care site and Jill Atkinson was located at a different location.   The limitations of evaluation and management by telemedicine as well as the availability of in-person appointments were discussed. Patient was informed that she  may incur a bill ( including co-pay) for this virtual visit encounter. Jill Atkinson  expressed understanding and gave verbal consent to proceed with virtual visit.     History of Present Illness:Jill Atkinson  is a 54 y.o. female presents with bilateral lid swelling, redness, itchiness, small bumps and pain. This has been constant, waxing and waning x 1 week. Started lat weekend after being outside mot of the weekend. Resolved mostly the week following with Vaseline and cool compresses. She takes xyzal daily for allergies. The problem recurred a few days ago and worsened. She was also outside most of the weekend. Does wood working. Denies messing with any plants or animals. No fever. No trouble with her vision.   Past Medical History:  Diagnosis Date  . Allergy    hay fever  . Anxiety   . Arrhythmia    AVNRT, s/p ablation Dr. Westley Gambles at [facility] -   . Asthma    exercise induced  . Chicken pox   . Depression    post partum  . Food allergy   . GERD (gastroesophageal reflux disease)   . Hypertension   . IBS (irritable bowel syndrome)   . Palpitations   . Reflux esophagitis   . Shingles   . Shortness of breath   . Swelling of both lower extremities   . UTI (lower urinary tract infection)     Allergies  Allergen Reactions  . Bactrim  [Sulfamethoxazole-Trimethoprim]     Lip swelling        Observations/Objective:VITALS: Per patient if applicable, see vitals. GENERAL: Alert, appears well and in no acute distress. HEENT:  Bilateral upper lid and lower lid swelling with erythema, patches of dry skin NECK: Normal movements of the head and neck. CARDIOPULMONARY: No increased WOB. Speaking in clear sentences. I:E ratio WNL.  MS: Moves all visible extremities without noticeable abnormality. PSYCH: Pleasant and cooperative, well-groomed. Speech normal rate and rhythm. Affect is appropriate. Insight and judgement are appropriate. Attention is focused, linear, and appropriate.  NEURO: CN grossly intact. Oriented as arrived to appointment on time with no prompting. Moves both UE equally.  SKIN: No obvious lesions, wounds, erythema, or cyanosis noted on face or hands.     Assessment and Plan: Symptoms and visual exam over camera consistent with some sort of contact dermatitis versus environmental allergen trigger. Patient denies swelling, redness and discomfort we will go ahead and treat with prednisone taper over the next 6 days. We will have her continue her Xyzal and she can add in a dose of Benadryl at bedtime as needed. Cool compresses and Vaseline for dryness and comfort   Follow Up Instructions: Follow up as needed for continued or worsening symptoms     I discussed the assessment and treatment plan with the patient. The patient was provided an opportunity to ask questions and all  were answered. The patient agreed with the plan and demonstrated an understanding of the instructions.   The patient was advised to call back or seek an in-person evaluation if the symptoms worsen or if the condition fails to improve as anticipated.    Orvan July, NP  12/16/2019 11:41 AM         Orvan July, NP 12/16/19 1141

## 2019-12-16 NOTE — Discharge Instructions (Signed)
Take the prednisone as prescribed Vaseline for comfort Cool compresses Benadryl for itching.  Follow up as needed for continued or worsening symptoms

## 2019-12-26 ENCOUNTER — Ambulatory Visit (INDEPENDENT_AMBULATORY_CARE_PROVIDER_SITE_OTHER)
Admission: RE | Admit: 2019-12-26 | Discharge: 2019-12-26 | Disposition: A | Discharge status: Home / Self Care | Payer: 59 | Source: Ambulatory Visit

## 2019-12-26 ENCOUNTER — Other Ambulatory Visit: Payer: Self-pay

## 2019-12-26 DIAGNOSIS — R21 Rash and other nonspecific skin eruption: Secondary | ICD-10-CM | POA: Diagnosis not present

## 2019-12-26 MED ORDER — TRIAMCINOLONE ACETONIDE 0.1 % EX CREA: 1.0000 "application " | TOPICAL_CREAM | Freq: Two times a day (BID) | CUTANEOUS | 0 refills | Status: DC

## 2019-12-26 NOTE — ED Provider Notes (Signed)
Virtual Visit via Video Note:  Jill Atkinson  initiated request for Telemedicine visit with [facility] Urgent Care team. I connected with Jill Atkinson  on 12/26/2019 at 12:08 PM  for a synchronized telemedicine visit using a video enabled HIPPA compliant telemedicine application. I verified that I am speaking with Jill Atkinson  using two identifiers. Jermiyah Ricotta C Mario Voong, PA-C  was physically located in a [place] Urgent care site and [name] was located at a different location.   The limitations of evaluation and management by telemedicine as well as the availability of in-person appointments were discussed. Patient was informed that she  may incur a bill ( including co-pay) for this virtual visit encounter. Jill Atkinson  expressed understanding and gave verbal consent to proceed with virtual visit.     History of Present Illness:Jill Atkinson  is a 54 y.o. female presents for evaluation of eye swelling and dryness.  Patient states that beginning 2/22 she began to develop mild swelling and dryness around her eyes, she attributed this to environmental allergies as she was outside frequently the weekend before.  She applied moist compresses and Vaseline which helped some, but symptoms worsened the following weekend.  She had video visit on 3/1 and began prednisone taper x6 days.  Symptoms were improving, but did not fully resolve and have since progressed since finishing course of prednisone.  She notes that her eyes feel mainly dry and more painful, denies significant itching.  Denies any symptoms within the eye itself, symptoms limited to eyelids and infra ocular area.  Denies any eye drainage or changes in vision.  Typically applies Vaseline or CeraVe a lotion.  Denies history of eczema.  Denies any other significant skin condition.  Typically sees dermatology every 2 years for skin check.    Allergies  Allergen Reactions  . Bactrim [Sulfamethoxazole-Trimethoprim]     Lip swelling     Past Medical History:  Diagnosis Date  . Allergy    hay fever  . Anxiety   . Arrhythmia    AVNRT, s/p ablation Dr. Westley Gambles at [facility]  . Asthma    exercise induced  . Chicken pox   . Depression    post partum  . Food allergy   . GERD (gastroesophageal reflux disease)   . Hypertension   . IBS (irritable bowel syndrome)   . Palpitations   . Reflux esophagitis   . Shingles   . Shortness of breath   . Swelling of both lower extremities   . UTI (lower urinary tract infection)      Social History   Tobacco Use  . Smoking status: Never Smoker  . Smokeless tobacco: Never Used  Substance Use Topics  . Alcohol use: Yes    Alcohol/week: 2.0 standard drinks    Types: 2 Glasses of wine per week    Comment: occasional, wine  . Drug use: No        Observations/Objective: Physical Exam  Constitutional: She is oriented to person, place, and time and well-developed, well-nourished, and in no distress. No distress.  HENT:  Head: Normocephalic and atraumatic.  Eyes:  Conjunctiva appears white, no erythema noted within eye  Bilateral upper lids appear erythematous, slightly dry, difficult to fully ascertain quality of skin or any lesions due to visual quality  Infraorbital area appears mildly swollen, no significant erythema  Moving eyes in all directions  Pulmonary/Chest: Effort normal. No respiratory distress.  Speaking full sentences  Musculoskeletal:  Cervical back: Normal range of motion.  Neurological: She is alert and oriented to person, place, and time.  Speech clear, face symmetric     Assessment and Plan:    ICD-10-CM   1. Rash and nonspecific skin eruption  R21     Appearance of rash seems more eczematous although no history of this.  Was responding to steroids although not fully resolved.  Likely inflammatory cause, but unclear trigger.  Recommended continuing moisturizing measures, use thicker creams over lotions to provide more  moisturizing effect, will also provide triamcinolone cream to use topically, recommended using very thin amount to no more than twice a day given area close to eyes and thinner skin in this area.  Recommended in person follow-up with urgent care, PCP or dermatology if continuing to persist or worsen  Discussed strict return precautions. Patient verbalized understanding and is agreeable with plan.    Follow Up Instructions:     I discussed the assessment and treatment plan with the patient. The patient was provided an opportunity to ask questions and all were answered. The patient agreed with the plan and demonstrated an understanding of the instructions.   The patient was advised to call back or seek an in-person evaluation if the symptoms worsen or if the condition fails to improve as anticipated.      Janith Lima, PA-C  12/26/2019 12:08 PM         Janith Lima, PA-C 12/26/19 1209

## 2019-12-26 NOTE — Discharge Instructions (Addendum)
Please try using thin amount of triamcinolone cream twice daily to area around eyes Pair with thicker moisutrizing cream- Cerva Ve, Eucerin  Follow up in person if not improving or worsening

## 2019-12-31 DIAGNOSIS — M9901 Segmental and somatic dysfunction of cervical region: Secondary | ICD-10-CM | POA: Diagnosis not present

## 2019-12-31 DIAGNOSIS — M9902 Segmental and somatic dysfunction of thoracic region: Secondary | ICD-10-CM | POA: Diagnosis not present

## 2019-12-31 DIAGNOSIS — M5412 Radiculopathy, cervical region: Secondary | ICD-10-CM | POA: Diagnosis not present

## 2019-12-31 DIAGNOSIS — M62830 Muscle spasm of back: Secondary | ICD-10-CM | POA: Diagnosis not present

## 2020-01-03 ENCOUNTER — Encounter: Payer: Self-pay | Admitting: Family

## 2020-01-24 ENCOUNTER — Other Ambulatory Visit: Payer: Self-pay

## 2020-01-24 ENCOUNTER — Other Ambulatory Visit (INDEPENDENT_AMBULATORY_CARE_PROVIDER_SITE_OTHER): Payer: 59

## 2020-01-24 DIAGNOSIS — Z0000 Encounter for general adult medical examination without abnormal findings: Secondary | ICD-10-CM

## 2020-01-24 LAB — CBC WITH DIFFERENTIAL/PLATELET
Basophils Absolute: 0 10*3/uL (ref 0.0–0.1)
Basophils Relative: 1 % (ref 0.0–3.0)
Eosinophils Absolute: 0.1 10*3/uL (ref 0.0–0.7)
Eosinophils Relative: 1.3 % (ref 0.0–5.0)
HCT: 32.7 % — ABNORMAL LOW (ref 36.0–46.0)
Hemoglobin: 11.2 g/dL — ABNORMAL LOW (ref 12.0–15.0)
Lymphocytes Relative: 23.3 % (ref 12.0–46.0)
Lymphs Abs: 1.2 10*3/uL (ref 0.7–4.0)
MCHC: 34.1 g/dL (ref 30.0–36.0)
MCV: 96.3 fl (ref 78.0–100.0)
Monocytes Absolute: 0.5 10*3/uL (ref 0.1–1.0)
Monocytes Relative: 10.8 % (ref 3.0–12.0)
Neutro Abs: 3.2 10*3/uL (ref 1.4–7.7)
Neutrophils Relative %: 63.6 % (ref 43.0–77.0)
Platelets: 277 10*3/uL (ref 150.0–400.0)
RBC: 3.4 Mil/uL — ABNORMAL LOW (ref 3.87–5.11)
RDW: 12.9 % (ref 11.5–15.5)
WBC: 5 10*3/uL (ref 4.0–10.5)

## 2020-01-24 LAB — VITAMIN D 25 HYDROXY (VIT D DEFICIENCY, FRACTURES): VITD: 51.99 ng/mL (ref 30.00–100.00)

## 2020-01-24 LAB — COMPREHENSIVE METABOLIC PANEL
ALT: 19 U/L (ref 0–35)
AST: 20 U/L (ref 0–37)
Albumin: 4.2 g/dL (ref 3.5–5.2)
Alkaline Phosphatase: 71 U/L (ref 39–117)
BUN: 12 mg/dL (ref 6–23)
CO2: 27 mEq/L (ref 19–32)
Calcium: 9.1 mg/dL (ref 8.4–10.5)
Chloride: 101 mEq/L (ref 96–112)
Creatinine, Ser: 0.76 mg/dL (ref 0.40–1.20)
GFR: 78.84 mL/min (ref >60.00)
Glucose, Bld: 77 mg/dL (ref 70–99)
Potassium: 3.9 mEq/L (ref 3.5–5.1)
Sodium: 137 mEq/L (ref 135–145)
Total Bilirubin: 0.5 mg/dL (ref 0.2–1.2)
Total Protein: 6.5 g/dL (ref 6.0–8.3)

## 2020-01-24 LAB — TSH: TSH: 1.62 u[IU]/mL (ref 0.35–4.50)

## 2020-01-24 LAB — LIPID PANEL
Cholesterol: 210 mg/dL — ABNORMAL HIGH (ref 0–200)
HDL: 68.9 mg/dL (ref >39.00)
LDL Cholesterol: 123 mg/dL — ABNORMAL HIGH (ref 0–99)
NonHDL: 141.08
Total CHOL/HDL Ratio: 3
Triglycerides: 88 mg/dL (ref 0.0–149.0)
VLDL: 17.6 mg/dL (ref 0.0–40.0)

## 2020-01-24 LAB — HEMOGLOBIN A1C: Hgb A1c MFr Bld: 5.1 % (ref 4.6–6.5)

## 2020-01-27 ENCOUNTER — Other Ambulatory Visit: Payer: Self-pay | Admitting: Family

## 2020-01-27 DIAGNOSIS — D649 Anemia, unspecified: Secondary | ICD-10-CM

## 2020-02-05 ENCOUNTER — Other Ambulatory Visit (HOSPITAL_COMMUNITY)
Admission: RE | Admit: 2020-02-05 | Discharge: 2020-02-05 | Disposition: A | Discharge status: Home / Self Care | Payer: 59 | Source: Ambulatory Visit | Attending: Family | Admitting: Family

## 2020-02-05 ENCOUNTER — Ambulatory Visit (INDEPENDENT_AMBULATORY_CARE_PROVIDER_SITE_OTHER): Payer: 59 | Admitting: Family

## 2020-02-05 ENCOUNTER — Other Ambulatory Visit: Payer: Self-pay

## 2020-02-05 ENCOUNTER — Encounter: Payer: Self-pay | Admitting: Family

## 2020-02-05 VITALS — BP 118/78 | HR 71 | Temp 96.2°F | Ht 66.0 in | Wt 157.6 lb

## 2020-02-05 DIAGNOSIS — R87619 Unspecified abnormal cytological findings in specimens from cervix uteri: Secondary | ICD-10-CM

## 2020-02-05 DIAGNOSIS — I499 Cardiac arrhythmia, unspecified: Secondary | ICD-10-CM | POA: Insufficient documentation

## 2020-02-05 DIAGNOSIS — D649 Anemia, unspecified: Secondary | ICD-10-CM

## 2020-02-05 DIAGNOSIS — I10 Essential (primary) hypertension: Secondary | ICD-10-CM

## 2020-02-05 NOTE — Assessment & Plan Note (Signed)
Controlled, continue current regimen

## 2020-02-05 NOTE — Patient Instructions (Signed)
Nice to see you!   

## 2020-02-05 NOTE — Progress Notes (Signed)
Subjective:    Patient ID: Jill Atkinson, female    DOB: 05-05-1966, 54 y.o.   MRN: [id number]  CC: Jill Atkinson is a 54 y.o. female who presents today for follow up.   HPI: Virtually coaching for upper strength, core.  Has lost 40 lbs intentionally by running  Would like note to participate in Virtual coaching.   Running 3 days per week. Walking 55000 steps per week.   Denies exertional chest pain or pressure, numbness or tingling radiating to left arm or jaw, palpitations, dizziness, frequent headaches, changes in vision.   Carries inhaler only needs albuterol if weather is really cold SOB.   HTN- compliant with lisinopril 20mg .   [employer] donation with [facility] 3 days prior to lab.  Cardiac ablation 2012 Dr Westley Gambles for EP. Also saw Dr Clayborn Bigness.  Per patient , had tachycardic episodes. None after ablation.   Abnormal pap 2019  HISTORY:  Past Medical History:  Diagnosis Date  . Allergy    hay fever  . Anxiety   . Arrhythmia    AVNRT, s/p ablation Dr. Westley Gambles at [facility]  . Asthma    exercise induced  . Chicken pox   . Depression    post partum  . Food allergy   . GERD (gastroesophageal reflux disease)   . Hypertension   . IBS (irritable bowel syndrome)   . Palpitations   . Reflux esophagitis   . Shingles   . Shortness of breath   . Swelling of both lower extremities   . UTI (lower urinary tract infection)    Past Surgical History:  Procedure Laterality Date  . CARDIAC ELECTROPHYSIOLOGY STUDY AND ABLATION  2012   . COLONOSCOPY WITH PROPOFOL N/A 11/02/2015   Procedure: COLONOSCOPY WITH PROPOFOL;  Surgeon: Manya Silvas, MD;  Location: [facility] ENDOSCOPY;  Service: Endoscopy;  Laterality: N/A;  . [place]   post delivery  . NASAL SINUS SURGERY  2006  . TONSILLECTOMY  1978   Family History  Problem Relation Age of Onset  . Arthritis Mother   . Hypertension Mother   . Thyroid disease Mother   . Arthritis Father   . Alcohol  abuse Maternal Grandmother   . Drug abuse Maternal Grandmother   . Arthritis Maternal Grandmother   . Stroke Maternal Grandmother   . Hypertension Maternal Grandmother   . Alcohol abuse Maternal Grandfather   . Drug abuse Maternal Grandfather   . Arthritis Maternal Grandfather   . Hypertension Maternal Grandfather     Allergies: Bactrim [sulfamethoxazole-trimethoprim] Current Outpatient Medications on File Prior to Visit  Medication Sig Dispense Refill  . albuterol (VENTOLIN HFA) 108 (90 Base) MCG/ACT inhaler Inhale 2 puffs into the lungs every 6 (six) hours as needed for wheezing. 18 g 3  . buPROPion (WELLBUTRIN XL) 300 MG 24 hr tablet Take 1 tablet (300 mg total) by mouth daily. 90 tablet 1  . escitalopram (LEXAPRO) 10 MG tablet TAKE 1 TABLET (10 MG TOTAL) BY MOUTH DAILY. 90 tablet 1  . famotidine (PEPCID) 10 MG tablet Take 10 mg by mouth 2 (two) times daily.    . Fluticasone Propionate (FLONASE NA) Place into the nose.    . Levocetirizine Dihydrochloride (XYZAL ALLERGY 24HR PO) Take by mouth daily.    Marland Kitchen lisinopril (ZESTRIL) 10 MG tablet Take 2 tablets (20 mg total) by mouth daily. 180 tablet 1  . triamcinolone cream (KENALOG) 0.1 % Apply 1 application topically 2 (two) times daily. Please  use thin amount and no more than twice daily. 80 g 0  . cyclobenzaprine (FLEXERIL) 10 MG tablet Take 1/2 to 1 whole tablet by mouth every 8 hours as needed for muscle spasms/pain. 20 tablet 0  . diclofenac (VOLTAREN) 75 MG EC tablet Take 1 tablet (75 mg total) by mouth 2 (two) times daily as needed for moderate pain. 30 tablet 0  . lansoprazole (PREVACID) 15 MG capsule Take 15 mg by mouth daily at 12 noon.    . Vitamin D, Ergocalciferol, (DRISDOL) 1.25 MG (50000 UT) CAPS capsule Take 1 capsule (50,000 Units total) by mouth every 7 (seven) days. 4 capsule 0   No current facility-administered medications on file prior to visit.    Social History   Tobacco Use  . Smoking status: Never Smoker  .  Smokeless tobacco: Never Used  Substance Use Topics  . Alcohol use: Yes    Alcohol/week: 2.0 standard drinks    Types: 2 Glasses of wine per week    Comment: occasional, wine  . Drug use: No    Review of Systems  Constitutional: Negative for chills and fever.  Respiratory: Negative for cough, shortness of breath and wheezing.   Cardiovascular: Negative for chest pain and palpitations.  Gastrointestinal: Negative for nausea and vomiting.      Objective:    BP 118/78   Pulse 71   Temp (!) 96.2 F (35.7 C) (Temporal)   Ht 5\' 6"  (1.676 m)   Wt 157 lb 9.6 oz (71.5 kg)   LMP 05/26/2016 Comment: preg test negative  SpO2 99%   BMI 25.44 kg/m  BP Readings from Last 3 Encounters:  02/05/20 118/78  07/08/19 (!) 152/86  06/19/19 121/80   Wt Readings from Last 3 Encounters:  02/05/20 157 lb 9.6 oz (71.5 kg)  10/04/19 153 lb (69.4 kg)  07/08/19 160 lb 3.2 oz (72.7 kg)    Physical Exam Vitals reviewed.  Constitutional:      Appearance: She is well-developed.  Eyes:     Conjunctiva/sclera: Conjunctivae normal.  Cardiovascular:     Rate and Rhythm: Normal rate and regular rhythm.     Pulses: Normal pulses.     Heart sounds: Normal heart sounds.  Pulmonary:     Effort: Pulmonary effort is normal.     Breath sounds: Normal breath sounds. No wheezing, rhonchi or rales.  Genitourinary:    Vagina: Normal.     Cervix: No cervical motion tenderness, discharge, friability, lesion, erythema, cervical bleeding or eversion.     Uterus: Normal. Not enlarged and not tender.      Adnexa: Right adnexa normal and left adnexa normal.       Right: No mass, tenderness or fullness.         Left: No mass, tenderness or fullness.       Comments: Pap smear collected.  Skin:    General: Skin is warm and dry.  Neurological:     Mental Status: She is alert.  Psychiatric:        Speech: Speech normal.        Behavior: Behavior normal.        Thought Content: Thought content normal.          Assessment & Plan:   Problem List Items Addressed This Visit      Cardiovascular and Mediastinum   Arrhythmia    No recurrence since ablation. Advised to stay vigilant however being asymptomatic , I agreed with her participation in virtual coaching.  Hypertension    Controlled, continue current regimen       Other Visit Diagnoses    Anemia, unspecified type    -  Primary   Relevant Orders   CBC with Differential/Platelet   Abnormal cervical Papanicolaou smear, unspecified abnormal pap finding       Relevant Orders   Cytology - PAP     Of note, Pap smear collected as is been abnormal in 2019.  Patient will do labs for anemia in the next couple of months. She declines occult stool test until after CBC.  I am having Lanice Schwab. Joya Gaskins maintain her Fluticasone Propionate (FLONASE NA), Levocetirizine Dihydrochloride (XYZAL ALLERGY 24HR PO), albuterol, lansoprazole, famotidine, Vitamin D (Ergocalciferol), escitalopram, diclofenac, cyclobenzaprine, lisinopril, buPROPion, and triamcinolone cream.   No orders of the defined types were placed in this encounter.   Return precautions given.   Risks, benefits, and alternatives of the medications and treatment plan prescribed today were discussed, and patient expressed understanding.   Education regarding symptom management and diagnosis given to patient on AVS.  Continue to follow with Burnard Hawthorne, FNP for routine health maintenance.   Jill Atkinson and I agreed with plan.   Mable Paris, FNP

## 2020-02-05 NOTE — Assessment & Plan Note (Signed)
No recurrence since ablation. Advised to stay vigilant however being asymptomatic , I agreed with her participation in virtual coaching.

## 2020-02-07 LAB — CYTOLOGY - PAP
Comment: NEGATIVE
Diagnosis: NEGATIVE
High risk HPV: NEGATIVE

## 2020-02-19 ENCOUNTER — Other Ambulatory Visit (INDEPENDENT_AMBULATORY_CARE_PROVIDER_SITE_OTHER): Payer: 59

## 2020-02-19 ENCOUNTER — Other Ambulatory Visit: Payer: Self-pay

## 2020-02-19 DIAGNOSIS — D649 Anemia, unspecified: Secondary | ICD-10-CM

## 2020-02-19 LAB — CBC WITH DIFFERENTIAL/PLATELET
Basophils Absolute: 0 10*3/uL (ref 0.0–0.1)
Basophils Relative: 0.6 % (ref 0.0–3.0)
Eosinophils Absolute: 0.1 10*3/uL (ref 0.0–0.7)
Eosinophils Relative: 2.1 % (ref 0.0–5.0)
HCT: 39 % (ref 36.0–46.0)
Hemoglobin: 13 g/dL (ref 12.0–15.0)
Lymphocytes Relative: 32.8 % (ref 12.0–46.0)
Lymphs Abs: 1.2 10*3/uL (ref 0.7–4.0)
MCHC: 33.3 g/dL (ref 30.0–36.0)
MCV: 98 fl (ref 78.0–100.0)
Monocytes Absolute: 0.4 10*3/uL (ref 0.1–1.0)
Monocytes Relative: 11.3 % (ref 3.0–12.0)
Neutro Abs: 2 10*3/uL (ref 1.4–7.7)
Neutrophils Relative %: 53.2 % (ref 43.0–77.0)
Platelets: 298 10*3/uL (ref 150.0–400.0)
RBC: 3.98 Mil/uL (ref 3.87–5.11)
RDW: 13.3 % (ref 11.5–15.5)
WBC: 3.7 10*3/uL — ABNORMAL LOW (ref 4.0–10.5)

## 2020-02-19 LAB — IBC + FERRITIN
Ferritin: 19.1 ng/mL (ref 10.0–291.0)
Iron: 60 ug/dL (ref 42–145)
Saturation Ratios: 12.2 % — ABNORMAL LOW (ref 20.0–50.0)
Transferrin: 351 mg/dL (ref 212.0–360.0)

## 2020-05-04 ENCOUNTER — Other Ambulatory Visit: Payer: Self-pay | Admitting: Family

## 2020-05-04 DIAGNOSIS — F32A Depression, unspecified: Secondary | ICD-10-CM

## 2020-06-03 ENCOUNTER — Other Ambulatory Visit: Payer: Self-pay

## 2020-06-03 DIAGNOSIS — I10 Essential (primary) hypertension: Secondary | ICD-10-CM

## 2020-06-03 MED ORDER — LISINOPRIL 10 MG PO TABS: 20.0000 mg | ORAL_TABLET | Freq: Every day | ORAL | 1 refills | Status: DC

## 2020-08-30 ENCOUNTER — Other Ambulatory Visit: Payer: Self-pay | Admitting: Family

## 2020-08-30 DIAGNOSIS — F32A Depression, unspecified: Secondary | ICD-10-CM

## 2020-10-15 ENCOUNTER — Other Ambulatory Visit: Payer: Self-pay | Admitting: Family

## 2020-10-15 DIAGNOSIS — F32A Depression, unspecified: Secondary | ICD-10-CM

## 2020-11-18 ENCOUNTER — Encounter: Payer: 59 | Admitting: Family

## 2020-11-27 ENCOUNTER — Other Ambulatory Visit: Payer: Self-pay | Admitting: Family

## 2020-11-27 DIAGNOSIS — I10 Essential (primary) hypertension: Secondary | ICD-10-CM

## 2020-12-09 ENCOUNTER — Other Ambulatory Visit: Payer: Self-pay

## 2020-12-09 ENCOUNTER — Encounter: Payer: Self-pay | Admitting: Family

## 2020-12-09 ENCOUNTER — Ambulatory Visit (INDEPENDENT_AMBULATORY_CARE_PROVIDER_SITE_OTHER): Payer: 59 | Admitting: Family

## 2020-12-09 VITALS — BP 136/78 | HR 89 | Temp 97.4°F | Resp 16 | Wt 165.0 lb

## 2020-12-09 DIAGNOSIS — M25572 Pain in left ankle and joints of left foot: Secondary | ICD-10-CM | POA: Diagnosis not present

## 2020-12-09 DIAGNOSIS — I10 Essential (primary) hypertension: Secondary | ICD-10-CM | POA: Diagnosis not present

## 2020-12-09 DIAGNOSIS — Z0000 Encounter for general adult medical examination without abnormal findings: Secondary | ICD-10-CM

## 2020-12-09 MED ORDER — LISINOPRIL 20 MG PO TABS: 20.0000 mg | ORAL_TABLET | Freq: Every day | ORAL | 1 refills | Status: DC

## 2020-12-09 NOTE — Progress Notes (Signed)
Subjective:    Patient ID: Jill Atkinson, female    DOB: 27-Aug-1966, 55 y.o.   MRN: [id number]  CC: Jill Atkinson is a 55 y.o. female who presents today for physical exam.    HPI: Complains left side ankle pain No swelling, redness, increased heat, numbness.    No known injury.  No left calf or left knee pain.  Has been wearing ankle brace and stopped running without improvement. Hasnt started running however walking again now.  Unable to run due to pain. Stairs are painful.  Depression and anxiety- feels well on lexapro 10mg .   HTN- compliant with lisinopril 20mg .      Colorectal Cancer Screening:due  Breast Cancer Screening: Mammogram due Cervical Cancer Screening: UTD   Lung Cancer Screening: Doesn't have 20 year pack year history and age > 43 years you 54 years        Tetanus - utd         Labs: Screening labs today. Exercise: Gets regular exercise.   Alcohol use:  occassional Smoking/tobacco use: Nonsmoker.    Donates to [employer], double donation regularly.   HISTORY:  Past Medical History:  Diagnosis Date   Allergy    hay fever   Anxiety    Arrhythmia    AVNRT, s/p ablation Dr. Westley Gambles at [facility]    exercise induced   Chicken pox    Depression    post partum   Food allergy    GERD (gastroesophageal reflux disease)    Hypertension    IBS (irritable bowel syndrome)    Palpitations    Reflux esophagitis    Shingles    Shortness of breath    Swelling of both lower extremities    UTI (lower urinary tract infection)     Past Surgical History:  Procedure Laterality Date   CARDIAC ELECTROPHYSIOLOGY STUDY AND ABLATION  2012    COLONOSCOPY WITH PROPOFOL N/A 11/02/2015   Procedure: COLONOSCOPY WITH PROPOFOL;  Surgeon: Manya Silvas, MD;  Location: [facility];  Service: Endoscopy;  Laterality: N/A;   DILATION AND CURETTAGE OF UTERUS  1995   post delivery   NASAL SINUS SURGERY  2006   TONSILLECTOMY  1978    Family History  Problem Relation Age of Onset   Arthritis Mother    Hypertension Mother    Thyroid disease Mother    Arthritis Father    Alcohol abuse Maternal Grandmother    Drug abuse Maternal Grandmother    Arthritis Maternal Grandmother    Stroke Maternal Grandmother    Hypertension Maternal Grandmother    Alcohol abuse Maternal Grandfather    Drug abuse Maternal Grandfather    Arthritis Maternal Grandfather    Hypertension Maternal Grandfather       ALLERGIES: Bactrim [sulfamethoxazole-trimethoprim]  Current Outpatient Medications on File Prior to Visit  Medication Sig Dispense Refill   Cholecalciferol (D3-1000) 25 MCG (1000 UT) tablet      Ferrous Sulfate (IRON) 28 MG TABS      albuterol (VENTOLIN HFA) 108 (90 Base) MCG/ACT inhaler Inhale 2 puffs into the lungs every 6 (six) hours as needed for wheezing. 18 g 3   buPROPion (WELLBUTRIN XL) 300 MG 24 hr tablet TAKE 1 TABLET BY MOUTH EVERY DAY 90 tablet 1   escitalopram (LEXAPRO) 10 MG tablet TAKE 1 TABLET BY MOUTH EVERY DAY 90 tablet 1   famotidine (PEPCID) 10 MG tablet Take 10 mg by mouth 2 (two) times daily.  Fluticasone Propionate (FLONASE NA) Place into the nose.     Levocetirizine Dihydrochloride (XYZAL ALLERGY 24HR PO) Take by mouth daily.     No current facility-administered medications on file prior to visit.    Social History   Tobacco Use   Smoking status: Never Smoker   Smokeless tobacco: Never Used  Vaping Use   Vaping Use: Never used  Substance Use Topics   Alcohol use: Yes    Alcohol/week: 2.0 standard drinks    Types: 2 Glasses of wine per week    Comment: occasional, wine   Drug use: No    Review of Systems  Constitutional: Negative for chills and fever.  Respiratory: Negative for cough.   Cardiovascular: Negative for chest pain and palpitations.  Gastrointestinal: Negative for nausea and vomiting.  Musculoskeletal: Positive for arthralgias (left ankle).       Objective:    BP 136/78    Pulse 89    Temp (!) 97.4 F (36.3 C) (Oral)    Resp 16    Wt 165 lb (74.8 kg)    LMP 05/26/2016 Comment: preg test negative   SpO2 99%    BMI 26.63 kg/m   BP Readings from Last 3 Encounters:  12/09/20 136/78  02/05/20 118/78  07/08/19 (!) 152/86   Wt Readings from Last 3 Encounters:  12/09/20 165 lb (74.8 kg)  02/05/20 157 lb 9.6 oz (71.5 kg)  10/04/19 153 lb (69.4 kg)    Physical Exam Vitals reviewed.  Constitutional:      Appearance: She is well-developed and well-nourished.  Eyes:     Conjunctiva/sclera: Conjunctivae normal.  Neck:     Thyroid: No thyroid mass or thyromegaly.  Cardiovascular:     Rate and Rhythm: Normal rate and regular rhythm.     Pulses: Normal pulses.     Heart sounds: Normal heart sounds.  Pulmonary:     Effort: Pulmonary effort is normal.     Breath sounds: Normal breath sounds. No wheezing, rhonchi or rales.     Comments: CBE performed.  Chest:  Breasts: Breasts are symmetrical.     Right: No inverted nipple, mass, nipple discharge, skin change or tenderness.     Left: No inverted nipple, mass, nipple discharge, skin change or tenderness.    Lymphadenopathy:     Head:     Right side of head: No submental, submandibular, tonsillar, preauricular, posterior auricular or occipital adenopathy.     Left side of head: No submental, submandibular, tonsillar, preauricular, posterior auricular or occipital adenopathy.     Cervical: No cervical adenopathy.     Right cervical: No superficial, deep or posterior cervical adenopathy.    Left cervical: No superficial, deep or posterior cervical adenopathy.     Upper Body:  No axillary adenopathy present. Skin:    General: Skin is warm and dry.  Neurological:     Mental Status: She is alert.  Psychiatric:        Mood and Affect: Mood and affect normal.        Speech: Speech normal.        Behavior: Behavior normal.        Thought Content: Thought content normal.         Assessment & Plan:   Problem List Items Addressed This Visit      Cardiovascular and Mediastinum   Hypertension    Slightly elevated today. Patient will  Monitor at home. Continue lisinopril 20mg  for now.      Relevant Medications  lisinopril (ZESTRIL) 20 MG tablet     Other   Left ankle pain    Chronic. Failed conservative therapy; referral to podiatry.      Relevant Orders   Ambulatory referral to Podiatry   Routine general medical examination at a health care facility - Primary    Deferred pap smear in the absence of complaints and pap UTD. CBE performed.       Relevant Medications   lisinopril (ZESTRIL) 20 MG tablet   Other Relevant Orders   Ambulatory referral to Gastroenterology   CBC with Differential/Platelet   Comprehensive metabolic panel   Hemoglobin A1c   Lipid panel   TSH   VITAMIN D 25 Hydroxy (Vit-D Deficiency, Fractures)   MM 3D SCREEN BREAST BILATERAL    Other Visit Diagnoses    Essential hypertension       Relevant Medications   lisinopril (ZESTRIL) 20 MG tablet       I have discontinued Lanice Schwab. Borgwardt's lansoprazole, Vitamin D (Ergocalciferol), diclofenac, cyclobenzaprine, and triamcinolone. I have also changed her lisinopril. Additionally, I am having her maintain her Fluticasone Propionate (FLONASE NA), Levocetirizine Dihydrochloride (XYZAL ALLERGY 24HR PO), albuterol, famotidine, escitalopram, buPROPion, Iron, and D3-1000.   Meds ordered this encounter  Medications   lisinopril (ZESTRIL) 20 MG tablet    Sig: Take 1 tablet (20 mg total) by mouth daily.    Dispense:  90 tablet    Refill:  1    Order Specific Question:   Supervising Provider    Answer:   Crecencio Mc [[id number]]    Return precautions given.   Risks, benefits, and alternatives of the medications and treatment plan prescribed today were discussed, and patient expressed understanding.   Education regarding symptom management and diagnosis given to patient on AVS.    Continue to follow with Burnard Hawthorne, FNP for routine health maintenance.   Jill Atkinson and I agreed with plan.   Mable Paris, FNP

## 2020-12-09 NOTE — Patient Instructions (Signed)
Referral to colonoscopy and to podiatry  Let us know if you dont hear back within a week in regards to an appointment being scheduled.   It is imperative that you are seen AT least twice per year for labs and monitoring. Monitor blood pressure at home and me 5-6 reading on separate days. Goal is less than 120/80, based on newest guidelines, however we certainly want to be less than 130/80;  if persistently higher, please make sooner follow up appointment so we can recheck you blood pressure and manage/ adjust medications.  Please call  and schedule your 3D mammogram as discussed.   [place]  [place], [place]   Nice to see you!   Health Maintenance for Postmenopausal Women Menopause is a normal process in which your ability to get pregnant comes to an end. This process happens slowly over many months or years, usually between the ages of 61 and 7. Menopause is complete when you have missed your menstrual periods for 12 months. It is important to talk with your health care provider about some of the most common conditions that affect women after menopause (postmenopausal women). These include heart disease, cancer, and bone loss (osteoporosis). Adopting a healthy lifestyle and getting preventive care can help to promote your health and wellness. The actions you take can also lower your chances of developing some of these common conditions. What should I know about menopause? During menopause, you may get a number of symptoms, such as:  Hot flashes. These can be moderate or severe.  Night sweats.  Decrease in sex drive.  Mood swings.  Headaches.  Tiredness.  Irritability.  Memory problems.  Insomnia. Choosing to treat or not to treat these symptoms is a decision that you make with your health care provider. Do I need hormone replacement therapy?  Hormone replacement therapy is effective in treating symptoms that are  caused by menopause, such as hot flashes and night sweats.  Hormone replacement carries certain risks, especially as you become older. If you are thinking about using estrogen or estrogen with progestin, discuss the benefits and risks with your health care provider. What is my risk for heart disease and stroke? The risk of heart disease, heart attack, and stroke increases as you age. One of the causes may be a change in the body's hormones during menopause. This can affect how your body uses dietary fats, triglycerides, and cholesterol. Heart attack and stroke are medical emergencies. There are many things that you can do to help prevent heart disease and stroke. Watch your blood pressure  High blood pressure causes heart disease and increases the risk of stroke. This is more likely to develop in people who have high blood pressure readings, are of African descent, or are overweight.  Have your blood pressure checked: ? Every 3-5 years if you are 39-21 years of age. ? Every year if you are 49 years old or older. Eat a healthy diet  Eat a diet that includes plenty of vegetables, fruits, low-fat dairy products, and lean protein.  Do not eat a lot of foods that are high in solid fats, added sugars, or sodium.   Get regular exercise Get regular exercise. This is one of the most important things you can do for your health. Most adults should:  Try to exercise for at least 150 minutes each week. The exercise should increase your heart rate and make you sweat (moderate-intensity exercise).  Try to do  strengthening exercises at least twice each week. Do these in addition to the moderate-intensity exercise.  Spend less time sitting. Even light physical activity can be beneficial. Other tips  Work with your health care provider to achieve or maintain a healthy weight.  Do not use any products that contain nicotine or tobacco, such as cigarettes, e-cigarettes, and chewing tobacco. If you need help  quitting, ask your health care provider.  Know your numbers. Ask your health care provider to check your cholesterol and your blood sugar (glucose). Continue to have your blood tested as directed by your health care provider. Do I need screening for cancer? Depending on your health history and family history, you may need to have cancer screening at different stages of your life. This may include screening for:  Breast cancer.  Cervical cancer.  Lung cancer.  Colorectal cancer. What is my risk for osteoporosis? After menopause, you may be at increased risk for osteoporosis. Osteoporosis is a condition in which bone destruction happens more quickly than new bone creation. To help prevent osteoporosis or the bone fractures that can happen because of osteoporosis, you may take the following actions:  If you are 26-88 years old, get at least 1,000 mg of calcium and at least 600 mg of vitamin D per day.  If you are older than age 7 but younger than age 30, get at least 1,200 mg of calcium and at least 600 mg of vitamin D per day.  If you are older than age 13, get at least 1,200 mg of calcium and at least 800 mg of vitamin D per day. Smoking and drinking excessive alcohol increase the risk of osteoporosis. Eat foods that are rich in calcium and vitamin D, and do weight-bearing exercises several times each week as directed by your health care provider. How does menopause affect my mental health? Depression may occur at any age, but it is more common as you become older. Common symptoms of depression include:  Low or sad mood.  Changes in sleep patterns.  Changes in appetite or eating patterns.  Feeling an overall lack of motivation or enjoyment of activities that you previously enjoyed.  Frequent crying spells. Talk with your health care provider if you think that you are experiencing depression. General instructions See your health care provider for regular wellness exams and  vaccines. This may include:  Scheduling regular health, dental, and eye exams.  Getting and maintaining your vaccines. These include: ? Influenza vaccine. Get this vaccine each year before the flu season begins. ? Pneumonia vaccine. ? Shingles vaccine. ? Tetanus, diphtheria, and pertussis (Tdap) booster vaccine. Your health care provider may also recommend other immunizations. Tell your health care provider if you have ever been abused or do not feel safe at home. Summary  Menopause is a normal process in which your ability to get pregnant comes to an end.  This condition causes hot flashes, night sweats, decreased interest in sex, mood swings, headaches, or lack of sleep.  Treatment for this condition may include hormone replacement therapy.  Take actions to keep yourself healthy, including exercising regularly, eating a healthy diet, watching your weight, and checking your blood pressure and blood sugar levels.  Get screened for cancer and depression. Make sure that you are up to date with all your vaccines. This information is not intended to replace advice given to you by your health care provider. Make sure you discuss any questions you have with your health care provider. Document Revised: 09/26/2018  Document Reviewed: 09/26/2018 Elsevier Patient Education  [employer].

## 2020-12-11 NOTE — Assessment & Plan Note (Signed)
Slightly elevated today. Patient will  Monitor at home. Continue lisinopril 20mg  for now.

## 2020-12-11 NOTE — Assessment & Plan Note (Addendum)
Deferred pap smear in the absence of complaints and pap UTD. CBE performed.

## 2020-12-11 NOTE — Assessment & Plan Note (Signed)
Chronic. Failed conservative therapy; referral to podiatry.

## 2020-12-23 ENCOUNTER — Encounter: Payer: Self-pay | Admitting: Podiatry

## 2020-12-23 ENCOUNTER — Ambulatory Visit (INDEPENDENT_AMBULATORY_CARE_PROVIDER_SITE_OTHER): Payer: 59

## 2020-12-23 ENCOUNTER — Other Ambulatory Visit (INDEPENDENT_AMBULATORY_CARE_PROVIDER_SITE_OTHER): Payer: 59

## 2020-12-23 ENCOUNTER — Other Ambulatory Visit: Payer: Self-pay

## 2020-12-23 ENCOUNTER — Ambulatory Visit (INDEPENDENT_AMBULATORY_CARE_PROVIDER_SITE_OTHER): Payer: 59 | Admitting: Podiatry

## 2020-12-23 DIAGNOSIS — M7752 Other enthesopathy of left foot: Secondary | ICD-10-CM

## 2020-12-23 DIAGNOSIS — Z0000 Encounter for general adult medical examination without abnormal findings: Secondary | ICD-10-CM

## 2020-12-23 DIAGNOSIS — S9002XA Contusion of left ankle, initial encounter: Secondary | ICD-10-CM | POA: Diagnosis not present

## 2020-12-23 LAB — COMPREHENSIVE METABOLIC PANEL
ALT: 15 U/L (ref 0–35)
AST: 18 U/L (ref 0–37)
Albumin: 4.4 g/dL (ref 3.5–5.2)
Alkaline Phosphatase: 72 U/L (ref 39–117)
BUN: 9 mg/dL (ref 6–23)
CO2: 28 mEq/L (ref 19–32)
Calcium: 9.7 mg/dL (ref 8.4–10.5)
Chloride: 102 mEq/L (ref 96–112)
Creatinine, Ser: 0.82 mg/dL (ref 0.40–1.20)
GFR: 79.91 mL/min (ref >60.00)
Glucose, Bld: 94 mg/dL (ref 70–99)
Potassium: 4.7 mEq/L (ref 3.5–5.1)
Sodium: 136 mEq/L (ref 135–145)
Total Bilirubin: 0.4 mg/dL (ref 0.2–1.2)
Total Protein: 7 g/dL (ref 6.0–8.3)

## 2020-12-23 LAB — CBC WITH DIFFERENTIAL/PLATELET
Basophils Absolute: 0 10*3/uL (ref 0.0–0.1)
Basophils Relative: 0.6 % (ref 0.0–3.0)
Eosinophils Absolute: 0.1 10*3/uL (ref 0.0–0.7)
Eosinophils Relative: 1.7 % (ref 0.0–5.0)
HCT: 39.4 % (ref 36.0–46.0)
Hemoglobin: 13.6 g/dL (ref 12.0–15.0)
Lymphocytes Relative: 32.5 % (ref 12.0–46.0)
Lymphs Abs: 1.2 10*3/uL (ref 0.7–4.0)
MCHC: 34.6 g/dL (ref 30.0–36.0)
MCV: 96.6 fl (ref 78.0–100.0)
Monocytes Absolute: 0.4 10*3/uL (ref 0.1–1.0)
Monocytes Relative: 11.1 % (ref 3.0–12.0)
Neutro Abs: 2.1 10*3/uL (ref 1.4–7.7)
Neutrophils Relative %: 54.1 % (ref 43.0–77.0)
Platelets: 299 10*3/uL (ref 150.0–400.0)
RBC: 4.08 Mil/uL (ref 3.87–5.11)
RDW: 13.5 % (ref 11.5–15.5)
WBC: 3.8 10*3/uL — ABNORMAL LOW (ref 4.0–10.5)

## 2020-12-23 LAB — HEMOGLOBIN A1C: Hgb A1c MFr Bld: 4.5 % — ABNORMAL LOW (ref 4.6–6.5)

## 2020-12-23 LAB — LIPID PANEL
Cholesterol: 220 mg/dL — ABNORMAL HIGH (ref 0–200)
HDL: 81.3 mg/dL (ref >39.00)
LDL Cholesterol: 125 mg/dL — ABNORMAL HIGH (ref 0–99)
NonHDL: 139.13
Total CHOL/HDL Ratio: 3
Triglycerides: 71 mg/dL (ref 0.0–149.0)
VLDL: 14.2 mg/dL (ref 0.0–40.0)

## 2020-12-23 MED ORDER — METHYLPREDNISOLONE 4 MG PO TBPK: ORAL_TABLET | ORAL | 0 refills | Status: DC

## 2020-12-23 NOTE — Progress Notes (Signed)
Subjective:  Patient ID: Jill Atkinson, female    DOB: [birth year],  MRN: [id number] HPI Chief Complaint  Patient presents with  . Ankle Pain    Lateral ankle left - aching x 3 months, no injury, is an active runner prior to pain, using OTC brace, steps seem to really aggravate it  . New Patient (Initial Visit)    55 y.o. female presents with the above complaint.   ROS: Denies fever chills nausea vomiting muscle aches pains calf pain back pain chest pain shortness of breath.  Past Medical History:  Diagnosis Date  . Allergy    hay fever  . Anxiety   . Arrhythmia    AVNRT, s/p ablation Dr. Westley Gambles at [facility]  . Asthma    exercise induced  . Chicken pox   . Depression    post partum  . Food allergy   . GERD (gastroesophageal reflux disease)   . Hypertension   . IBS (irritable bowel syndrome)   . Palpitations   . Reflux esophagitis   . Shingles   . Shortness of breath   . Swelling of both lower extremities   . UTI (lower urinary tract infection)    Past Surgical History:  Procedure Laterality Date  . CARDIAC ELECTROPHYSIOLOGY STUDY AND ABLATION  2012   . COLONOSCOPY WITH PROPOFOL N/A 11/02/2015   Procedure: COLONOSCOPY WITH PROPOFOL;  Surgeon: Manya Silvas, MD;  Location: [facility] ENDOSCOPY;  Service: Endoscopy;  Laterality: N/A;  . [place]   post delivery  . NASAL SINUS SURGERY  2006  . TONSILLECTOMY  1978    Current Outpatient Medications:  .  methylPREDNISolone (MEDROL DOSEPAK) 4 MG TBPK tablet, 6 day dose pack - take as directed, Disp: 21 tablet, Rfl: 0 .  albuterol (VENTOLIN HFA) 108 (90 Base) MCG/ACT inhaler, Inhale 2 puffs into the lungs every 6 (six) hours as needed for wheezing., Disp: 18 g, Rfl: 3 .  buPROPion (WELLBUTRIN XL) 300 MG 24 hr tablet, TAKE 1 TABLET BY MOUTH EVERY DAY, Disp: 90 tablet, Rfl: 1 .  Cholecalciferol (D3-1000) 25 MCG (1000 UT) tablet, , Disp: , Rfl:  .  escitalopram (LEXAPRO) 10 MG tablet, TAKE 1 TABLET BY  MOUTH EVERY DAY, Disp: 90 tablet, Rfl: 1 .  famotidine (PEPCID) 10 MG tablet, Take 10 mg by mouth 2 (two) times daily., Disp: , Rfl:  .  Ferrous Sulfate (IRON) 28 MG TABS, , Disp: , Rfl:  .  Fluticasone Propionate (FLONASE NA), Place into the nose., Disp: , Rfl:  .  Levocetirizine Dihydrochloride (XYZAL ALLERGY 24HR PO), Take by mouth daily., Disp: , Rfl:  .  lisinopril (ZESTRIL) 20 MG tablet, Take 1 tablet (20 mg total) by mouth daily., Disp: 90 tablet, Rfl: 1  Allergies  Allergen Reactions  . Bactrim [Sulfamethoxazole-Trimethoprim]     Lip swelling   Review of Systems Objective:  There were no vitals filed for this visit.  General: Well developed, nourished, in no acute distress, alert and oriented x3   Dermatological: Skin is warm, dry and supple bilateral. Nails x 10 are well maintained; remaining integument appears unremarkable at this time. There are no open sores, no preulcerative lesions, no rash or signs of infection present.  Vascular: Dorsalis Pedis artery and Posterior Tibial artery pedal pulses are 2/4 bilateral with immedate capillary fill time. Pedal hair growth present. No varicosities and no lower extremity edema present bilateral.   Neruologic: Grossly intact via light touch bilateral. Vibratory intact via  tuning fork bilateral. Protective threshold with Semmes Wienstein monofilament intact to all pedal sites bilateral. Patellar and Achilles deep tendon reflexes 2+ bilateral. No Babinski or clonus noted bilateral.   Musculoskeletal: No gross boney pedal deformities bilateral. No pain, crepitus, or limitation noted with foot and ankle range of motion bilateral. Muscular strength 5/5 in all groups tested bilateral.  She has some fluctuance on palpation of the peroneal tendons but no true tenderness on the tendons themselves.  She has pain on direct palpation of the anterior talofibular ligament at its origin on the fibula and she has tenderness on palpation of the malleolus  itself.  She does have some tenderness on sharp inversion of the foot and ankle.  Gait: Unassisted, Nonantalgic.    Radiographs:  Radiographs taken today of the left ankle demonstrate normal ankle mortise no osteochondral lesion seen no cyst seen no acute fractures noted.  Assessment & Plan:   Assessment: Peroneal tendinitis contusion fibular malleolus.  Plan: Start her on a Medrol Dosepak.  If not improved in a month we will consider an MRI of that lef ankle     Max T. [place], [place]

## 2020-12-24 ENCOUNTER — Other Ambulatory Visit: Payer: Self-pay | Admitting: Podiatry

## 2020-12-24 DIAGNOSIS — M7752 Other enthesopathy of left foot: Secondary | ICD-10-CM

## 2020-12-24 LAB — TSH: TSH: 1.75 u[IU]/mL (ref 0.35–4.50)

## 2020-12-24 LAB — VITAMIN D 25 HYDROXY (VIT D DEFICIENCY, FRACTURES): VITD: 56.82 ng/mL (ref 30.00–100.00)

## 2020-12-25 ENCOUNTER — Encounter: Payer: Self-pay | Admitting: Family

## 2020-12-29 ENCOUNTER — Other Ambulatory Visit: Payer: Self-pay | Admitting: Family

## 2020-12-29 ENCOUNTER — Encounter: Payer: Self-pay | Admitting: Family

## 2020-12-29 DIAGNOSIS — R899 Unspecified abnormal finding in specimens from other organs, systems and tissues: Secondary | ICD-10-CM

## 2021-01-27 ENCOUNTER — Encounter: Payer: Self-pay | Admitting: Podiatry

## 2021-01-27 ENCOUNTER — Other Ambulatory Visit: Payer: Self-pay

## 2021-01-27 ENCOUNTER — Ambulatory Visit (INDEPENDENT_AMBULATORY_CARE_PROVIDER_SITE_OTHER): Payer: 59 | Admitting: Podiatry

## 2021-01-27 DIAGNOSIS — S93492A Sprain of other ligament of left ankle, initial encounter: Secondary | ICD-10-CM

## 2021-01-27 DIAGNOSIS — S86312A Strain of muscle(s) and tendon(s) of peroneal muscle group at lower leg level, left leg, initial encounter: Secondary | ICD-10-CM

## 2021-01-27 NOTE — Progress Notes (Signed)
She presents today for follow-up of her peroneal tendinitis of her left foot states that it had gotten some better but as soon as her started becoming active on it time particularly over the weekend it really started to hurt again and is much more swollen much more tender.  Objective: Vital signs are stable alert and oriented x3.  Pulses are palpable.  She has some pain on palpation along the peroneal tendons particularly the peroneus longus tendon she also has tenderness on palpation of the anterior talofibular ligament.  Assessment: Probable tear of the anterior talofibular ligament and the peroneal tendons of the left foot.  Plan: Requesting MRI for differential diagnosis and surgical planning of the peroneal tendon tears and a tear of the anterior talofibular ligament.  All conservative therapies have failed to alleviate patient's symptomatology.

## 2021-02-07 ENCOUNTER — Other Ambulatory Visit: Payer: Self-pay

## 2021-02-07 ENCOUNTER — Ambulatory Visit
Admission: RE | Admit: 2021-02-07 | Discharge: 2021-02-07 | Disposition: A | Discharge status: Home / Self Care | Payer: 59 | Source: Ambulatory Visit | Attending: Podiatry | Admitting: Podiatry

## 2021-02-07 DIAGNOSIS — S93492A Sprain of other ligament of left ankle, initial encounter: Secondary | ICD-10-CM

## 2021-02-07 DIAGNOSIS — S86312A Strain of muscle(s) and tendon(s) of peroneal muscle group at lower leg level, left leg, initial encounter: Secondary | ICD-10-CM

## 2021-02-09 ENCOUNTER — Other Ambulatory Visit: Payer: Self-pay | Admitting: Family

## 2021-02-09 DIAGNOSIS — F32A Depression, unspecified: Secondary | ICD-10-CM

## 2021-02-17 ENCOUNTER — Ambulatory Visit (INDEPENDENT_AMBULATORY_CARE_PROVIDER_SITE_OTHER): Payer: 59 | Admitting: Podiatry

## 2021-02-17 ENCOUNTER — Ambulatory Visit
Admission: RE | Admit: 2021-02-17 | Discharge: 2021-02-17 | Disposition: A | Discharge status: Home / Self Care | Payer: 59 | Source: Ambulatory Visit | Attending: Family | Admitting: Family

## 2021-02-17 ENCOUNTER — Other Ambulatory Visit: Payer: Self-pay

## 2021-02-17 ENCOUNTER — Encounter: Payer: Self-pay | Admitting: Podiatry

## 2021-02-17 DIAGNOSIS — S93492D Sprain of other ligament of left ankle, subsequent encounter: Secondary | ICD-10-CM | POA: Diagnosis not present

## 2021-02-17 DIAGNOSIS — Z1231 Encounter for screening mammogram for malignant neoplasm of breast: Secondary | ICD-10-CM | POA: Diagnosis present

## 2021-02-17 DIAGNOSIS — Z0000 Encounter for general adult medical examination without abnormal findings: Secondary | ICD-10-CM

## 2021-02-17 NOTE — Progress Notes (Signed)
She presents today for follow-up of her peroneal tendinitis.  She states that it has gotten some better over the past few days because she has not been as busy.  Objective: Vital signs are stable alert and oriented x3.  Pulses are palpable.  She has tenderness on palpation of the peroneal tendons from the distal fibula to the level of the calcaneal tubercle.  This coincides with the read from the radiologist.  Assessment: Short segment tear of the peroneus longus tendon left.  Plan: For short segment tear I recommended physical therapy she will do this at [facility] physical therapy and I will follow-up with her in 1 month if it worsens she is to notify us immediately we did discuss the need for surgery.

## 2021-04-11 ENCOUNTER — Other Ambulatory Visit: Payer: Self-pay | Admitting: Family

## 2021-04-11 DIAGNOSIS — F32A Depression, unspecified: Secondary | ICD-10-CM

## 2021-05-13 LAB — HM COLONOSCOPY

## 2021-06-03 ENCOUNTER — Other Ambulatory Visit: Payer: Self-pay | Admitting: Family

## 2021-06-03 DIAGNOSIS — I10 Essential (primary) hypertension: Secondary | ICD-10-CM

## 2021-06-08 ENCOUNTER — Other Ambulatory Visit: Payer: Self-pay

## 2021-06-08 ENCOUNTER — Encounter: Payer: Self-pay | Admitting: Family

## 2021-06-08 ENCOUNTER — Ambulatory Visit (INDEPENDENT_AMBULATORY_CARE_PROVIDER_SITE_OTHER): Payer: 59 | Admitting: Family

## 2021-06-08 VITALS — BP 132/90 | HR 84 | Temp 97.5°F | Ht 65.98 in | Wt 169.4 lb

## 2021-06-08 DIAGNOSIS — K219 Gastro-esophageal reflux disease without esophagitis: Secondary | ICD-10-CM | POA: Diagnosis not present

## 2021-06-08 DIAGNOSIS — I10 Essential (primary) hypertension: Secondary | ICD-10-CM

## 2021-06-08 DIAGNOSIS — F331 Major depressive disorder, recurrent, moderate: Secondary | ICD-10-CM | POA: Diagnosis not present

## 2021-06-08 MED ORDER — LISINOPRIL 10 MG PO TABS: 10.0000 mg | ORAL_TABLET | Freq: Every day | ORAL | 3 refills | Status: DC

## 2021-06-08 NOTE — Assessment & Plan Note (Signed)
Uncontrolled, increase lisinopril to 30 mg.  BMP in 1 week

## 2021-06-08 NOTE — Assessment & Plan Note (Signed)
Chronic, stable.  Continue Lexapro 10 mg, Wellbutrin 300 mg

## 2021-06-08 NOTE — Progress Notes (Signed)
Subjective:    Patient ID: Jill Atkinson, female    DOB: Sep 15, 1966, 55 y.o.   MRN: [id number]  CC: Jill Atkinson is a 55 y.o. female who presents today for follow up.   HPI: Feels well today .  No complaints  Hypertension-compliant with lisinopril 20 mg.  Blood pressure at home running 130/90.  No chest pain or shortness of breath.  She is starting to run again.  Depression and anxiety-compliant with Lexapro 10 mg, Wellbutrin 300 mg.  She feels well on this regimen and feels like doses are adequate.  GERD-compliant with Pepcid 10 mg BID daily with resolution of cough. no pain or trouble swallowing.     colonoscopy 05/13/21, repeat is in 10 years. HISTORY:  Past Medical History:  Diagnosis Date   Allergy    hay fever   Anxiety    Arrhythmia    AVNRT, s/p ablation Dr. Westley Gambles at [place]    exercise induced   Chicken pox    Depression    post partum   Food allergy    GERD (gastroesophageal reflux disease)    Hypertension    IBS (irritable bowel syndrome)    Palpitations    Reflux esophagitis    Shingles    Shortness of breath    Swelling of both lower extremities    UTI (lower urinary tract infection)    Past Surgical History:  Procedure Laterality Date   CARDIAC ELECTROPHYSIOLOGY STUDY AND ABLATION  2012    COLONOSCOPY WITH PROPOFOL N/A 11/02/2015   Procedure: COLONOSCOPY WITH PROPOFOL;  Surgeon: Manya Silvas, MD;  Location: [place];  Service: Endoscopy;  Laterality: N/A;   DILATION AND CURETTAGE OF UTERUS  1995   post delivery   NASAL SINUS SURGERY  2006   TONSILLECTOMY  1978   Family History  Problem Relation Age of Onset   Arthritis Mother    Hypertension Mother    Thyroid disease Mother    Arthritis Father    Alcohol abuse Maternal Grandmother    Drug abuse Maternal Grandmother    Arthritis Maternal Grandmother    Stroke Maternal Grandmother    Hypertension Maternal Grandmother    Alcohol abuse Maternal Grandfather    Drug  abuse Maternal Grandfather    Arthritis Maternal Grandfather    Hypertension Maternal Grandfather    Breast cancer Neg Hx     Allergies: Bactrim [sulfamethoxazole-trimethoprim] Current Outpatient Medications on File Prior to Visit  Medication Sig Dispense Refill   albuterol (VENTOLIN HFA) 108 (90 Base) MCG/ACT inhaler Inhale 2 puffs into the lungs every 6 (six) hours as needed for wheezing. 18 g 3   buPROPion (WELLBUTRIN XL) 300 MG 24 hr tablet TAKE 1 TABLET BY MOUTH EVERY DAY 90 tablet 1   Cholecalciferol (D3-1000) 25 MCG (1000 UT) tablet      escitalopram (LEXAPRO) 10 MG tablet TAKE 1 TABLET BY MOUTH EVERY DAY 90 tablet 1   famotidine (PEPCID) 10 MG tablet Take 10 mg by mouth 2 (two) times daily.     Ferrous Sulfate (IRON) 28 MG TABS      Fluticasone Propionate (FLONASE NA) Place into the nose.     Levocetirizine Dihydrochloride (XYZAL ALLERGY 24HR PO) Take by mouth daily.     lisinopril (ZESTRIL) 20 MG tablet Take 1 tablet (20 mg total) by mouth daily. 90 tablet 1   lisinopril (ZESTRIL) 10 MG tablet TAKE 2 TABLETS BY MOUTH EVERY DAY 180 tablet 1   No current  facility-administered medications on file prior to visit.    Social History   Tobacco Use   Smoking status: Never   Smokeless tobacco: Never  Vaping Use   Vaping Use: Never used  Substance Use Topics   Alcohol use: Yes    Alcohol/week: 2.0 standard drinks    Types: 2 Glasses of wine per week    Comment: occasional, wine   Drug use: No    Review of Systems  Constitutional:  Negative for chills and fever.  Respiratory:  Negative for cough.   Cardiovascular:  Negative for chest pain and palpitations.  Gastrointestinal:  Negative for nausea and vomiting.     Objective:    BP 132/90   Pulse 84   Temp (!) 97.5 F (36.4 C)   Ht 5' 5.98" (1.676 m)   Wt 169 lb 6.4 oz (76.8 kg)   LMP 05/26/2016   SpO2 97%   BMI 27.36 kg/m  BP Readings from Last 3 Encounters:  06/08/21 132/90  12/09/20 136/78  02/05/20 118/78    Wt Readings from Last 3 Encounters:  06/08/21 169 lb 6.4 oz (76.8 kg)  12/09/20 165 lb (74.8 kg)  02/05/20 157 lb 9.6 oz (71.5 kg)    Physical Exam Vitals reviewed.  Constitutional:      Appearance: She is well-developed.  Eyes:     Conjunctiva/sclera: Conjunctivae normal.  Cardiovascular:     Rate and Rhythm: Normal rate and regular rhythm.     Pulses: Normal pulses.     Heart sounds: Normal heart sounds.  Pulmonary:     Effort: Pulmonary effort is normal.     Breath sounds: Normal breath sounds. No wheezing, rhonchi or rales.  Skin:    General: Skin is warm and dry.  Neurological:     Mental Status: She is alert.  Psychiatric:        Speech: Speech normal.        Behavior: Behavior normal.        Thought Content: Thought content normal.       Assessment & Plan:   Problem List Items Addressed This Visit       Cardiovascular and Mediastinum   Hypertension - Primary    Uncontrolled, increase lisinopril to 30 mg.  BMP in 1 week      Relevant Medications   lisinopril (ZESTRIL) 10 MG tablet   Other Relevant Orders   CBC with Differential/Platelet   Basic metabolic panel     Digestive   GERD (gastroesophageal reflux disease)    Chronic, stable.  Continue Pepcid 10 mg twice daily daily.  Counseled on red flag, alarm symptoms including trouble swallowing, choking pain with swallowing, unusual weight loss.  We will continue to monitor.        Other   Depression    Chronic, stable.  Continue Lexapro 10 mg, Wellbutrin 300 mg        I am having Jill Atkinson. Jill Atkinson start on lisinopril. I am also having her maintain her Fluticasone Propionate (FLONASE NA), Levocetirizine Dihydrochloride (XYZAL ALLERGY 24HR PO), albuterol, famotidine, Iron, D3-1000, lisinopril, escitalopram, buPROPion, and lisinopril.   Meds ordered this encounter  Medications   lisinopril (ZESTRIL) 10 MG tablet    Sig: Take 1 tablet (10 mg total) by mouth daily.    Dispense:  90 tablet     Refill:  3    Order Specific Question:   Supervising Provider    Answer:   Crecencio Mc [[id number]]    Return precautions  given.   Risks, benefits, and alternatives of the medications and treatment plan prescribed today were discussed, and patient expressed understanding.   Education regarding symptom management and diagnosis given to patient on AVS.  Continue to follow with Burnard Hawthorne, FNP for routine health maintenance.   Jill Atkinson and I agreed with plan.   Mable Paris, FNP

## 2021-06-08 NOTE — Patient Instructions (Signed)
Increase lisinopril to 30 mg daily.  It is imperative that you are seen AT least twice per year for labs and monitoring. Monitor blood pressure at home and me 5-6 reading on separate days. Goal is less than 120/80, based on newest guidelines, however we certainly want to be less than 130/80;  if persistently higher, please make sooner follow up appointment so we can recheck you blood pressure and manage/ adjust medications.  Managing Your Hypertension Hypertension, also called high blood pressure, is when the force of the blood pressing against the walls of the arteries is too strong. Arteries are blood vessels that carry blood from your heart throughout your body. Hypertension forces the heart to work harder to pump blood and may cause the arteries tobecome narrow or stiff. Understanding blood pressure readings Your personal target blood pressure may vary depending on your medical conditions, your age, and other factors. A blood pressure reading includes a higher number over a lower number. Ideally, your blood pressure should be below 120/80. You should know that: The first, or top, number is called the systolic pressure. It is a measure of the pressure in your arteries as your heart beats. The second, or bottom number, is called the diastolic pressure. It is a measure of the pressure in your arteries as the heart relaxes. Blood pressure is classified into four stages. Based on your blood pressure reading, your health care provider may use the following stages to determine what type of treatment you need, if any. Systolic pressure and diastolicpressure are measured in a unit called mmHg. Normal Systolic pressure: below 123456. Diastolic pressure: below 80. Elevated Systolic pressure: [id number]. Diastolic pressure: below 80. Hypertension stage 1 Systolic pressure: 0000000. Diastolic pressure: [id number]. Hypertension stage 2 Systolic pressure: [id number] or above. Diastolic pressure: 90 or above. How can this  condition affect me? Managing your hypertension is an important responsibility. Over time, hypertension can damage the arteries and decrease blood flow to important parts of the body, including the brain, heart, and kidneys. Having untreated or uncontrolled hypertension can lead to: A heart attack. A stroke. A weakened blood vessel (aneurysm). Heart failure. Kidney damage. Eye damage. Metabolic syndrome. Memory and concentration problems. Vascular dementia. What actions can I take to manage this condition? Hypertension can be managed by making lifestyle changes and possibly by taking medicines. Your health care provider will help you make a plan to bring yourblood pressure within a normal range. Nutrition  Eat a diet that is high in fiber and potassium, and low in salt (sodium), added sugar, and fat. An example eating plan is called the Dietary Approaches to Stop Hypertension (DASH) diet. To eat this way: Eat plenty of fresh fruits and vegetables. Try to fill one-half of your plate at each meal with fruits and vegetables. Eat whole grains, such as whole-wheat pasta, brown rice, or whole-grain bread. Fill about one-fourth of your plate with whole grains. Eat low-fat dairy products. Avoid fatty cuts of meat, processed or cured meats, and poultry with skin. Fill about one-fourth of your plate with lean proteins such as fish, chicken without skin, beans, eggs, and tofu. Avoid pre-made and processed foods. These tend to be higher in sodium, added sugar, and fat. Reduce your daily sodium intake. Most people with hypertension should eat less than 1,500 mg of sodium a day.  Lifestyle  Work with your health care provider to maintain a healthy body weight or to lose weight. Ask what an ideal weight is for you. Get at least 30  minutes of exercise that causes your heart to beat faster (aerobic exercise) most days of the week. Activities may include walking, swimming, or biking. Include exercise to  strengthen your muscles (resistance exercise), such as weight lifting, as part of your weekly exercise routine. Try to do these types of exercises for 30 minutes at least 3 days a week. Do not use any products that contain nicotine or tobacco, such as cigarettes, e-cigarettes, and chewing tobacco. If you need help quitting, ask your health care provider. Control any long-term (chronic) conditions you have, such as high cholesterol or diabetes. Identify your sources of stress and find ways to manage stress. This may include meditation, deep breathing, or making time for fun activities.  Alcohol use Do not drink alcohol if: Your health care provider tells you not to drink. You are pregnant, may be pregnant, or are planning to become pregnant. If you drink alcohol: Limit how much you use to: 0-1 drink a day for women. 0-2 drinks a day for men. Be aware of how much alcohol is in your drink. In the [place] one drink equals one 12 oz bottle of beer (355 mL), one 5 oz glass of wine (148 mL), or one 1 oz glass of hard liquor (44 mL). Medicines Your health care provider may prescribe medicine if lifestyle changes are not enough to get your blood pressure under control and if: Your systolic blood pressure is 130 or higher. Your diastolic blood pressure is 80 or higher. Take medicines only as told by your health care provider. Follow the directions carefully. Blood pressure medicines must be taken as told by your health care provider. The medicine does not work as well when you skip doses. Skippingdoses also puts you at risk for problems. Monitoring Before you monitor your blood pressure: Do not smoke, drink caffeinated beverages, or exercise within 30 minutes before taking a measurement. Use the bathroom and empty your bladder (urinate). Sit quietly for at least 5 minutes before taking measurements. Monitor your blood pressure at home as told by your health care provider. To do this: Sit with your back  straight and supported. Place your feet flat on the floor. Do not cross your legs. Support your arm on a flat surface, such as a table. Make sure your upper arm is at heart level. Each time you measure, take two or three readings one minute apart and record the results. You may also need to have your blood pressure checked regularly by your healthcare provider. General information Talk with your health care provider about your diet, exercise habits, and other lifestyle factors that may be contributing to hypertension. Review all the medicines you take with your health care provider because there may be side effects or interactions. Keep all visits as told by your health care provider. Your health care provider can help you create and adjust your plan for managing your high blood pressure. Where to find more information National Heart, Lung, and Blood Institute: https://wilson-eaton.com/ American Heart Association: www.heart.org Contact a health care provider if: You think you are having a reaction to medicines you have taken. You have repeated (recurrent) headaches. You feel dizzy. You have swelling in your ankles. You have trouble with your vision. Get help right away if: You develop a severe headache or confusion. You have unusual weakness or numbness, or you feel faint. You have severe pain in your chest or abdomen. You vomit repeatedly. You have trouble breathing. These symptoms may represent a serious problem that is an  emergency. Do not wait to see if the symptoms will go away. Get medical help right away. Call your local emergency services (911 in the [place] Do not drive yourself to the hospital. Summary Hypertension is when the force of blood pumping through your arteries is too strong. If this condition is not controlled, it may put you at risk for serious complications. Your personal target blood pressure may vary depending on your medical conditions, your age, and other factors. For  most people, a normal blood pressure is less than 120/80. Hypertension is managed by lifestyle changes, medicines, or both. Lifestyle changes to help manage hypertension include losing weight, eating a healthy, low-sodium diet, exercising more, stopping smoking, and limiting alcohol. This information is not intended to replace advice given to you by your health care provider. Make sure you discuss any questions you have with your healthcare provider. Document Revised: 11/08/2019 Document Reviewed: 09/03/2019 Elsevier Patient Education  2022 [employer].

## 2021-06-08 NOTE — Assessment & Plan Note (Signed)
Chronic, stable.  Continue Pepcid 10 mg twice daily daily.  Counseled on red flag, alarm symptoms including trouble swallowing, choking pain with swallowing, unusual weight loss.  We will continue to monitor.

## 2021-06-15 ENCOUNTER — Other Ambulatory Visit (INDEPENDENT_AMBULATORY_CARE_PROVIDER_SITE_OTHER): Payer: 59

## 2021-06-15 ENCOUNTER — Other Ambulatory Visit: Payer: Self-pay

## 2021-06-15 DIAGNOSIS — I10 Essential (primary) hypertension: Secondary | ICD-10-CM | POA: Diagnosis not present

## 2021-06-15 LAB — CBC WITH DIFFERENTIAL/PLATELET
Basophils Absolute: 0 10*3/uL (ref 0.0–0.1)
Basophils Relative: 0.5 % (ref 0.0–3.0)
Eosinophils Absolute: 0.1 10*3/uL (ref 0.0–0.7)
Eosinophils Relative: 1.4 % (ref 0.0–5.0)
HCT: 43.6 % (ref 36.0–46.0)
Hemoglobin: 14.5 g/dL (ref 12.0–15.0)
Lymphocytes Relative: 30 % (ref 12.0–46.0)
Lymphs Abs: 1.2 10*3/uL (ref 0.7–4.0)
MCHC: 33.3 g/dL (ref 30.0–36.0)
MCV: 96.5 fl (ref 78.0–100.0)
Monocytes Absolute: 0.4 10*3/uL (ref 0.1–1.0)
Monocytes Relative: 9.7 % (ref 3.0–12.0)
Neutro Abs: 2.4 10*3/uL (ref 1.4–7.7)
Neutrophils Relative %: 58.4 % (ref 43.0–77.0)
Platelets: 268 10*3/uL (ref 150.0–400.0)
RBC: 4.52 Mil/uL (ref 3.87–5.11)
RDW: 13 % (ref 11.5–15.5)
WBC: 4.2 10*3/uL (ref 4.0–10.5)

## 2021-06-15 LAB — BASIC METABOLIC PANEL
BUN: 8 mg/dL (ref 6–23)
CO2: 27 mEq/L (ref 19–32)
Calcium: 9.9 mg/dL (ref 8.4–10.5)
Chloride: 102 mEq/L (ref 96–112)
Creatinine, Ser: 0.84 mg/dL (ref 0.40–1.20)
GFR: 77.37 mL/min (ref >60.00)
Glucose, Bld: 90 mg/dL (ref 70–99)
Potassium: 5.1 mEq/L (ref 3.5–5.1)
Sodium: 137 mEq/L (ref 135–145)

## 2021-09-03 ENCOUNTER — Other Ambulatory Visit: Payer: Self-pay | Admitting: Family

## 2021-09-03 DIAGNOSIS — F32A Depression, unspecified: Secondary | ICD-10-CM

## 2021-10-19 ENCOUNTER — Other Ambulatory Visit: Payer: Self-pay | Admitting: Family

## 2021-10-19 DIAGNOSIS — F32A Depression, unspecified: Secondary | ICD-10-CM

## 2021-11-06 IMAGING — MG DIGITAL SCREENING BILAT W/ TOMO W/ CAD
8 series · 8 of 24 positions shown · non-contrast
Comparison: Previous exam(s).

CLINICAL DATA: Screening.

EXAM:
DIGITAL SCREENING BILATERAL MAMMOGRAM WITH TOMO AND CAD

[R MLO synth-2D]
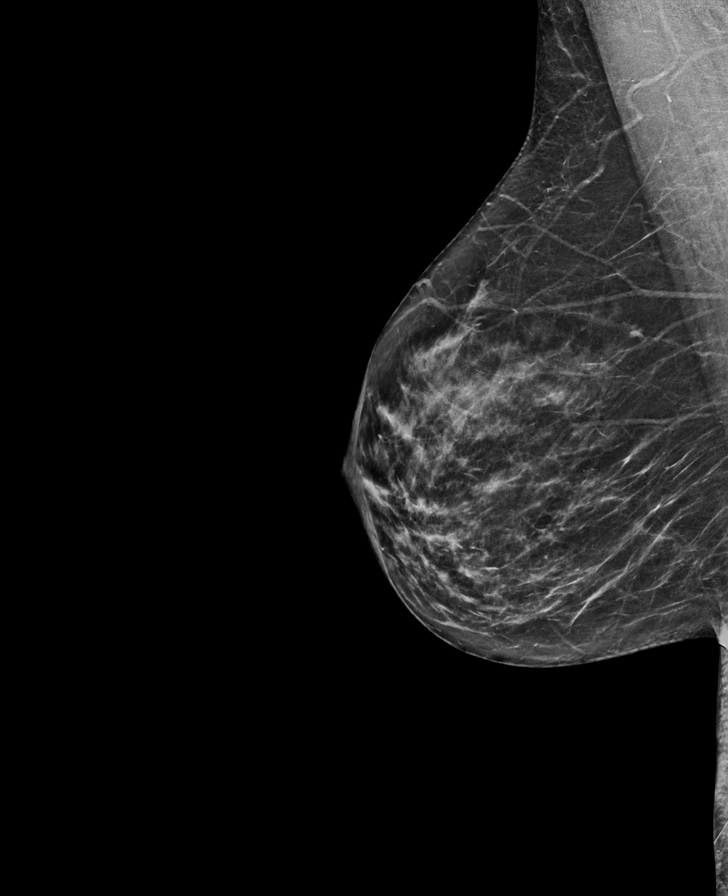

[L MLO synth-2D]
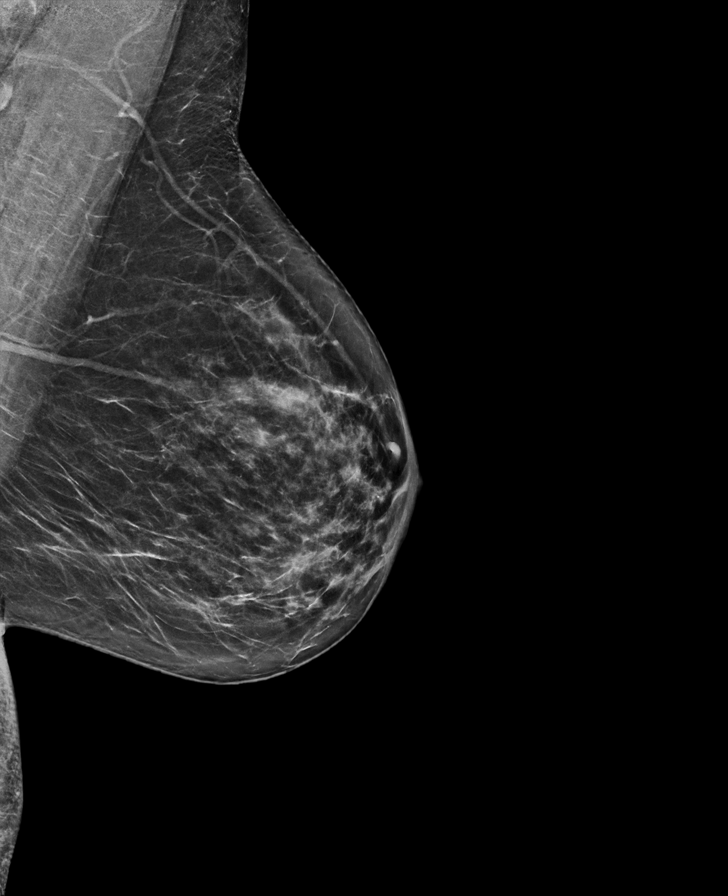

[L CC synth-2D]
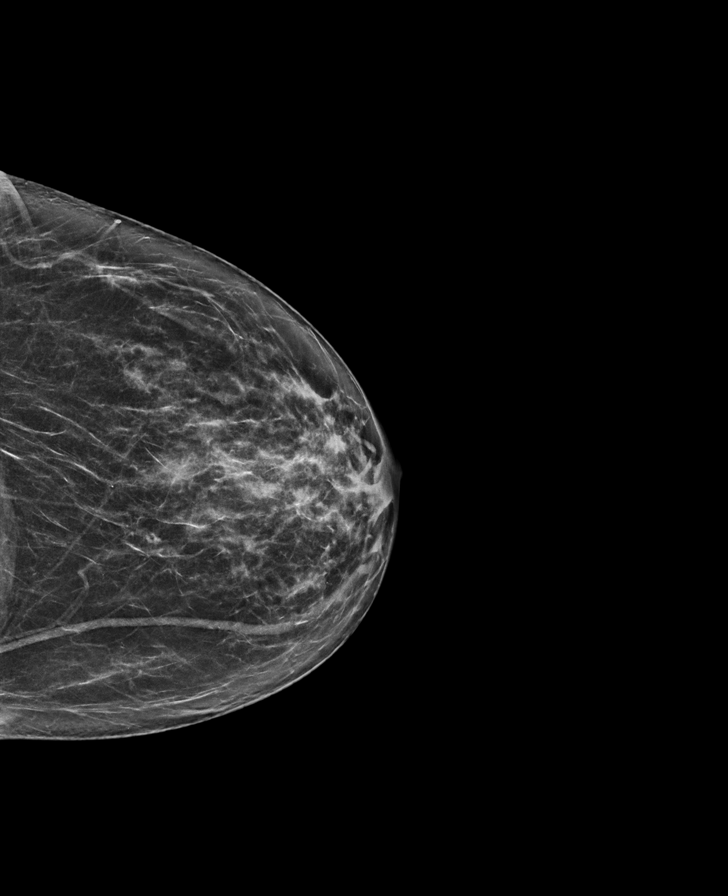

[R CC synth-2D]
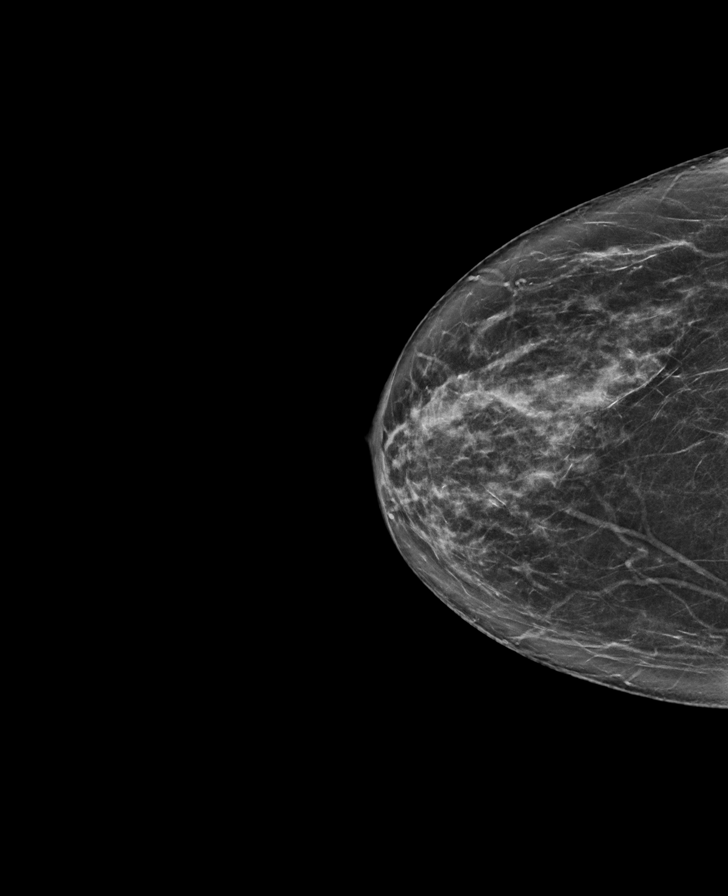

[L CC tomo · tomo slice 33/65.0]
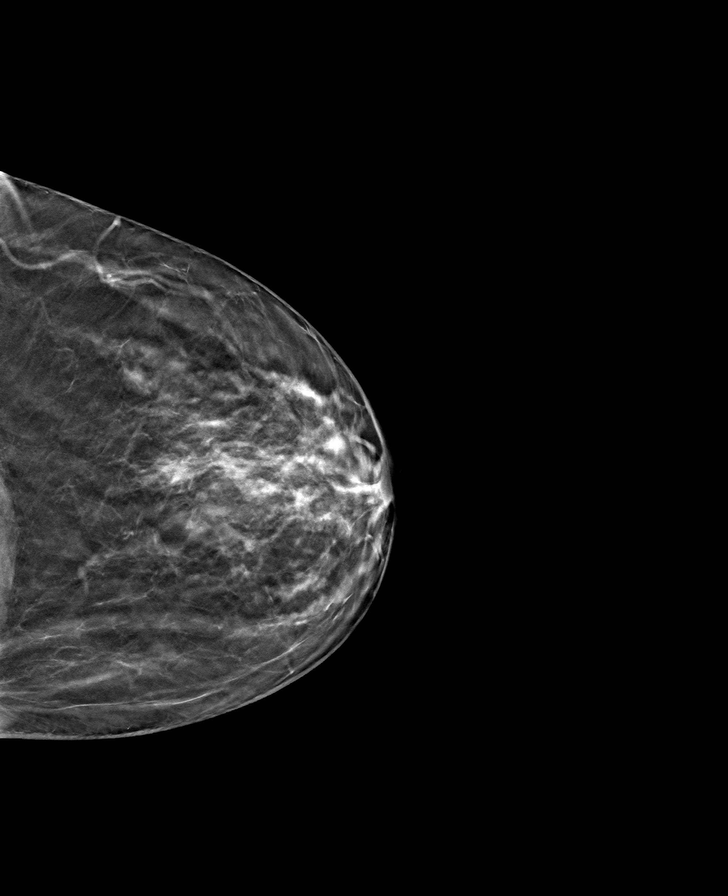

[L MLO tomo · tomo slice 36/71.0]
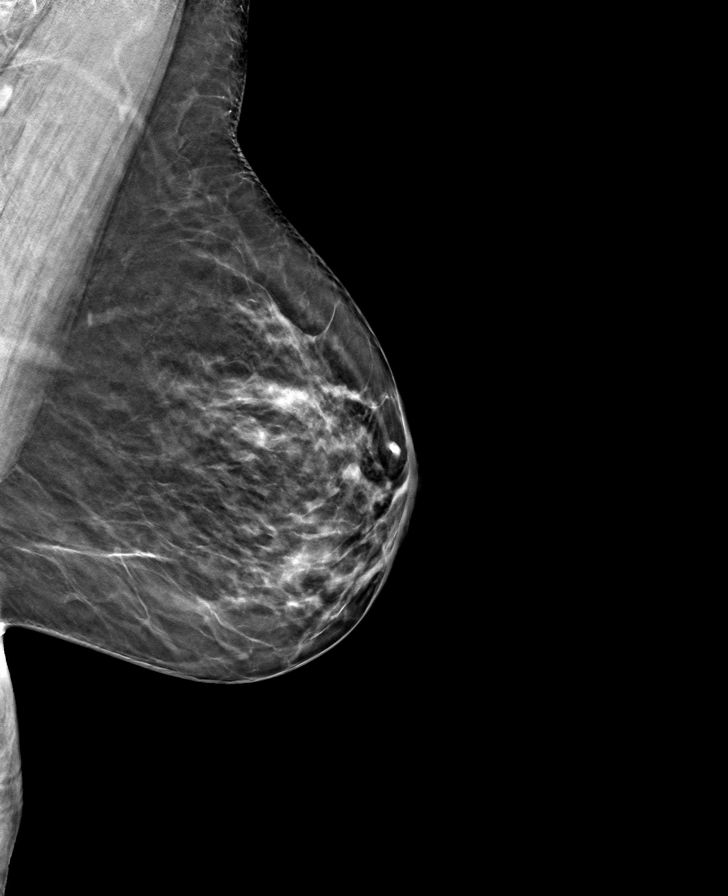

[R CC tomo · tomo slice 36/71.0]
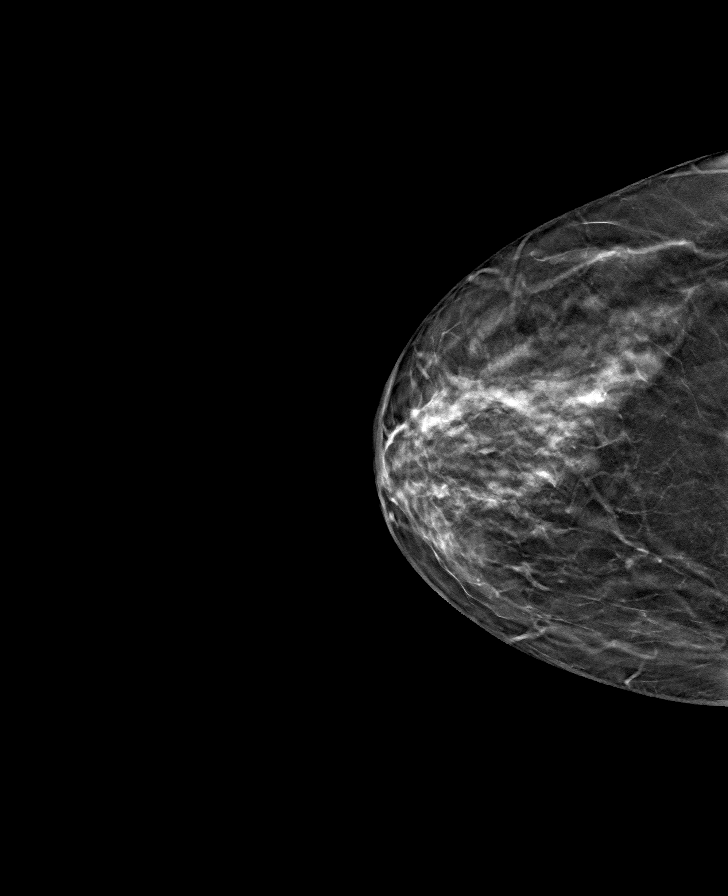

[R MLO tomo · tomo slice 32/63.0]
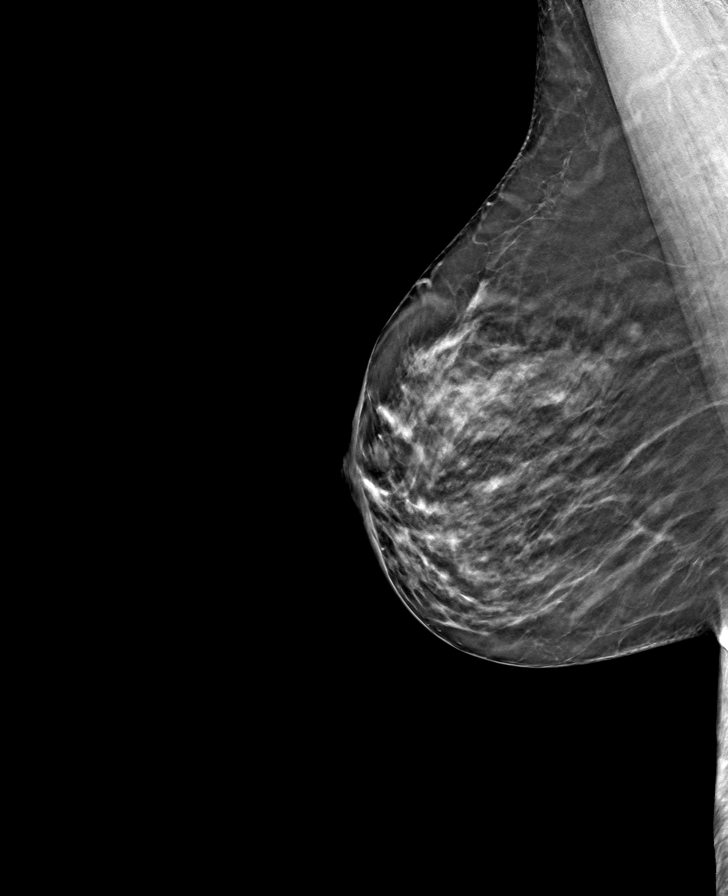

[8 of 24 positions shown; findings below may reference images not displayed]

ACR Breast Density Category c: The breast tissue is heterogeneously
dense, which may obscure small masses.
FINDINGS: There are no findings suspicious for malignancy. Images were
processed with CAD.
IMPRESSION: No mammographic evidence of malignancy. A result letter of this
screening mammogram will be mailed directly to the patient.

RECOMMENDATION:
Screening mammogram in one year. (Code:FT-U-LHB)

BI-RADS CATEGORY  1: Negative.

## 2021-12-22 ENCOUNTER — Other Ambulatory Visit: Payer: Self-pay

## 2021-12-22 ENCOUNTER — Ambulatory Visit: Payer: 59

## 2021-12-22 ENCOUNTER — Encounter: Payer: Self-pay | Admitting: Podiatry

## 2021-12-22 ENCOUNTER — Ambulatory Visit (INDEPENDENT_AMBULATORY_CARE_PROVIDER_SITE_OTHER): Payer: 59 | Admitting: Podiatry

## 2021-12-22 DIAGNOSIS — M7672 Peroneal tendinitis, left leg: Secondary | ICD-10-CM | POA: Diagnosis not present

## 2021-12-22 DIAGNOSIS — S86312A Strain of muscle(s) and tendon(s) of peroneal muscle group at lower leg level, left leg, initial encounter: Secondary | ICD-10-CM

## 2021-12-22 MED ORDER — MELOXICAM 15 MG PO TABS: 15.0000 mg | ORAL_TABLET | Freq: Every day | ORAL | 3 refills | Status: DC

## 2021-12-22 MED ORDER — METHYLPREDNISOLONE 4 MG PO TBPK: ORAL_TABLET | ORAL | 0 refills | Status: DC

## 2021-12-23 NOTE — Progress Notes (Signed)
She presents today states that my peroneal tendinitis flared up October 09, 2021.  She states she had been doing well after physical therapy everything was going great and then she just stood up from the couch and felt a pain in the foot and ankle. ? ?Objective: Vital signs are stable she is alert and oriented x3.  Pulses are palpable.  She has good dorsiflexion and inversion and plantarflexion her plantarflexion and eversion is painful abduction is not painful.  There is a palpable mass along the posterior inferior aspect of the fibula which is on the tendon and it is with range of motion. ? ?Assessment: Probable strain or tear of the peroneus longus tendon. ? ?Plan: At this point I highly recommend we repeat MRI this since we had an injury to it last year in the same place.  I did start her on methylprednisolone to be followed by mobic. ?

## 2021-12-31 ENCOUNTER — Other Ambulatory Visit: Payer: Self-pay

## 2021-12-31 ENCOUNTER — Ambulatory Visit
Admission: RE | Admit: 2021-12-31 | Discharge: 2021-12-31 | Disposition: A | Discharge status: Home / Self Care | Payer: 59 | Source: Ambulatory Visit | Attending: Podiatry | Admitting: Podiatry

## 2021-12-31 DIAGNOSIS — S86312A Strain of muscle(s) and tendon(s) of peroneal muscle group at lower leg level, left leg, initial encounter: Secondary | ICD-10-CM

## 2022-01-10 ENCOUNTER — Encounter: Payer: Self-pay | Admitting: Podiatry

## 2022-01-10 ENCOUNTER — Ambulatory Visit (INDEPENDENT_AMBULATORY_CARE_PROVIDER_SITE_OTHER): Payer: 59 | Admitting: Podiatry

## 2022-01-10 ENCOUNTER — Other Ambulatory Visit: Payer: Self-pay

## 2022-01-10 DIAGNOSIS — S86312D Strain of muscle(s) and tendon(s) of peroneal muscle group at lower leg level, left leg, subsequent encounter: Secondary | ICD-10-CM | POA: Diagnosis not present

## 2022-01-10 NOTE — Progress Notes (Signed)
She presents today for follow-up of her left ankle says it really has not gotten any better she would just had an MRI done and would like to go over it with [place]. ? ?Objective: Vital signs are stable tellurian x3 severe pain on palpation of the posterior lateral malleolar region extending to the fifth metatarsal bone.  She has fluctuance in this area.  MRI does demonstrate a severe tear of the peroneus brevis from basically the muscle belly distally to the bone. ? ?She has pain on abduction against resistance and plantarflexion and eversion against resistance.  MRI also goes on to state that she does have tenosynovitis of the posterior tibial tendon and both peroneal tendons. ? ?Assessment: Peroneal tendinitis peroneal tendon rupture posterior tibial tendinitis. ? ?Plan: Discussed etiology pathology and surgical therapies discussed with her in great detail today the necessity for repair of the peroneus brevis tendon possibly primary repair of the tendon or tenodesis to the peroneus longus tendon.  She understands this is amendable to it.  We are also going to provide her with a PRP injection to the posterior tibial tendon as well as the peroneal tendons once were done.  In regard to place her in a below-knee cast which will be nonweightbearing for 6 to 8 weeks.  She understands this and is amenable to it.  We did discuss the possible postop complications which may include but are not limited to postop pain bleeding swelling infection recurrence need for further surgery overcorrection under correction loss of digit loss of limb loss of life loss of sensation worsening of her pain.  We did also discuss the surgery center and anesthesia group and the necessity for a scooter.  I will follow-up with her in the near future for surgical intervention. ?

## 2022-01-12 ENCOUNTER — Telehealth: Payer: Self-pay | Admitting: Urology

## 2022-01-12 NOTE — Telephone Encounter (Signed)
DOS - 01/28/22 ? ?REPAIR PERONEAL TENDON LEFT --- 28086 ? ?[employer] EFFECTIVE DATE - 10/17/21 ? ? ?PLAN DEDUCTIBLE - $2,000.00 W/ $1,260.57 REMAINING ?OUT OF POCKET - $3,000.00 W/ $2,260.57 REMAINING ?COINSURANCE - 10% ?COPAY - $0.00 ? ? ?PER UHC WEBSTE FOR CPT CODE 60737 Notification or Prior Authorization is not required for the requested services ? ?This Careers adviser does not currently require a prior authorization for these services. If you have general questions about the prior authorization requirements, please call us at [phone] or visit [email or web address] > Clinician Resources > Advance and Admission Notification Requirements. The number above acknowledges your notification. Please write this number down for future reference. Notification is not a guarantee of coverage or payment. ? ?Decision ID #:[id number] ?

## 2022-01-17 ENCOUNTER — Telehealth: Payer: Self-pay

## 2022-01-17 NOTE — Telephone Encounter (Signed)
Jill Atkinson called to reschedule her surgery with Dr. Milinda Pointer on 01/28/2022. She stated she will be out of town. I have her rescheduled to 02/18/2022. Notified Dr. Milinda Pointer and Caren Griffins with [facility] ?

## 2022-01-18 ENCOUNTER — Other Ambulatory Visit: Payer: Self-pay | Admitting: Family

## 2022-01-18 ENCOUNTER — Telehealth: Payer: Self-pay | Admitting: Podiatry

## 2022-01-18 DIAGNOSIS — I10 Essential (primary) hypertension: Secondary | ICD-10-CM

## 2022-01-18 DIAGNOSIS — Z0000 Encounter for general adult medical examination without abnormal findings: Secondary | ICD-10-CM

## 2022-02-02 ENCOUNTER — Encounter: Payer: 59 | Admitting: Podiatry

## 2022-02-09 ENCOUNTER — Encounter: Payer: 59 | Admitting: Podiatry

## 2022-02-23 ENCOUNTER — Encounter: Payer: 59 | Admitting: Podiatry

## 2022-03-02 ENCOUNTER — Other Ambulatory Visit: Payer: Self-pay | Admitting: Podiatry

## 2022-03-02 ENCOUNTER — Other Ambulatory Visit: Payer: Self-pay | Admitting: Family

## 2022-03-02 ENCOUNTER — Encounter: Payer: 59 | Admitting: Podiatry

## 2022-03-02 DIAGNOSIS — F32A Depression, unspecified: Secondary | ICD-10-CM

## 2022-03-02 MED ORDER — OXYCODONE-ACETAMINOPHEN 10-325 MG PO TABS: 1.0000 | ORAL_TABLET | Freq: Three times a day (TID) | ORAL | 0 refills | Status: AC | PRN

## 2022-03-02 MED ORDER — CEPHALEXIN 500 MG PO CAPS: 500.0000 mg | ORAL_CAPSULE | Freq: Three times a day (TID) | ORAL | 0 refills | Status: DC

## 2022-03-02 MED ORDER — ONDANSETRON HCL 4 MG PO TABS: 4.0000 mg | ORAL_TABLET | Freq: Three times a day (TID) | ORAL | 0 refills | Status: DC | PRN

## 2022-03-02 NOTE — Telephone Encounter (Signed)
[facility] office to schedule appointment and get refill on medication. ?

## 2022-03-04 DIAGNOSIS — S86312D Strain of muscle(s) and tendon(s) of peroneal muscle group at lower leg level, left leg, subsequent encounter: Secondary | ICD-10-CM | POA: Diagnosis not present

## 2022-03-09 ENCOUNTER — Encounter: Payer: Self-pay | Admitting: Podiatry

## 2022-03-09 ENCOUNTER — Encounter: Payer: 59 | Admitting: Podiatry

## 2022-03-09 ENCOUNTER — Ambulatory Visit (INDEPENDENT_AMBULATORY_CARE_PROVIDER_SITE_OTHER): Payer: 59 | Admitting: Podiatry

## 2022-03-09 VITALS — BP 154/83 | HR 69 | Temp 98.0°F

## 2022-03-09 DIAGNOSIS — S86312D Strain of muscle(s) and tendon(s) of peroneal muscle group at lower leg level, left leg, subsequent encounter: Secondary | ICD-10-CM

## 2022-03-09 DIAGNOSIS — Z9889 Other specified postprocedural states: Secondary | ICD-10-CM

## 2022-03-09 NOTE — Progress Notes (Signed)
She presents today for her first postop visit date of surgery 03/04/2022 peroneal tendon repair left.  She states that it throbs sometimes if it is been hanging down long but for the most part as she sits in a recliner keeps it elevated and sleeps in the recliner as well.  She denies fever chills nausea vomiting muscle aches pains calf pain back pain chest pain shortness of breath.  Objective: Vital signs are stable alert and oriented x3.  Pulses are palpable popliteal fossa.  Cast is loose proximally tight distally but she can move her toes with no numbness or tingling.  Vital signs are stable she is alert and oriented x3.  Assessment: Well-healing surgical ankle.  Plan: Follow-up with me in 1 week for cast change

## 2022-03-10 ENCOUNTER — Telehealth: Payer: Self-pay | Admitting: Podiatry

## 2022-03-10 NOTE — Telephone Encounter (Signed)
Pt called asking about getting a temporary handicap parking placard due to recent surgery by Dr Milinda Pointer. She is a [facility] pt and would like to pick it up there if approved.

## 2022-03-10 NOTE — Telephone Encounter (Signed)
Filled out form called pt let her know form is ready for pick up.

## 2022-03-16 ENCOUNTER — Encounter: Payer: Self-pay | Admitting: Podiatry

## 2022-03-16 ENCOUNTER — Encounter: Payer: 59 | Admitting: Podiatry

## 2022-03-16 ENCOUNTER — Ambulatory Visit (INDEPENDENT_AMBULATORY_CARE_PROVIDER_SITE_OTHER): Payer: 59 | Admitting: Podiatry

## 2022-03-16 DIAGNOSIS — S86312D Strain of muscle(s) and tendon(s) of peroneal muscle group at lower leg level, left leg, subsequent encounter: Secondary | ICD-10-CM | POA: Diagnosis not present

## 2022-03-16 DIAGNOSIS — Z9889 Other specified postprocedural states: Secondary | ICD-10-CM

## 2022-03-16 NOTE — Progress Notes (Signed)
She presents today for postop visit date of surgery 03/04/2022 repair of peroneal tendon the left with PRP injection in a cast.  She states that he has felt fine.  She denies fever chills nausea vomiting muscle aches pains calf pain back pain chest pain shortness of breath.  States that has she is started working again from her just this past week.  Objective: Vital signs are stable alert and in x3.  Cast is intact once removed demonstrates dry sterile dressing intact.  Once this was removed there is no erythema cellulitis drainage or odor staples are intact.  She has good inversion eversion plantarflexion dorsiflexion of against resistance.  Assessment: Well-healing surgical foot.  Plan: Redressed today dressed a compressive dressing and reapplication of her cast.  She tolerated this well I will follow-up with her in 2 weeks she will remain nonweightbearing keep this clean and dry.

## 2022-03-21 ENCOUNTER — Encounter: Payer: 59 | Admitting: Podiatry

## 2022-03-30 ENCOUNTER — Encounter: Payer: Self-pay | Admitting: Podiatry

## 2022-03-30 ENCOUNTER — Ambulatory Visit (INDEPENDENT_AMBULATORY_CARE_PROVIDER_SITE_OTHER): Payer: 59 | Admitting: Podiatry

## 2022-03-30 ENCOUNTER — Ambulatory Visit (INDEPENDENT_AMBULATORY_CARE_PROVIDER_SITE_OTHER): Payer: 59

## 2022-03-30 ENCOUNTER — Encounter: Payer: 59 | Admitting: Podiatry

## 2022-03-30 DIAGNOSIS — Z9889 Other specified postprocedural states: Secondary | ICD-10-CM

## 2022-03-30 NOTE — Progress Notes (Signed)
Subjective:   Patient ID: Jill Atkinson, female   DOB: 56 y.o.   MRN: [id number]   HPI Patient presents stating she has had surgery 4 weeks ago is here for cast removal and staple removal left.  Patient of Dr. Milinda Pointer   ROS      Objective:  Physical Exam  Neurovascular status intact negative Homans' sign noted upon removal of cast with staples intact lateral side from peroneal repair     Assessment:  Perineal repair left healing well moderate edema     Plan:  Cast removed air fracture walker dispensed continue nonweightbearing for 1-2 more weeks with gradual weightbearing of a mild nature and see him back again in 4 weeks and light range of motion can be done.  Patient can get the foot wet ankle compression stocking was dispensed to keep swelling down and patient encouraged to call questions concerns

## 2022-04-04 ENCOUNTER — Encounter: Payer: 59 | Admitting: Podiatry

## 2022-04-13 ENCOUNTER — Encounter: Payer: 59 | Admitting: Podiatry

## 2022-04-15 ENCOUNTER — Other Ambulatory Visit: Payer: Self-pay | Admitting: Family

## 2022-04-15 DIAGNOSIS — Z0000 Encounter for general adult medical examination without abnormal findings: Secondary | ICD-10-CM

## 2022-04-15 DIAGNOSIS — I10 Essential (primary) hypertension: Secondary | ICD-10-CM

## 2022-04-15 DIAGNOSIS — F32A Depression, unspecified: Secondary | ICD-10-CM

## 2022-04-19 ENCOUNTER — Other Ambulatory Visit: Payer: Self-pay | Admitting: Podiatry

## 2022-05-11 ENCOUNTER — Ambulatory Visit (INDEPENDENT_AMBULATORY_CARE_PROVIDER_SITE_OTHER): Payer: 59 | Admitting: Podiatry

## 2022-05-11 ENCOUNTER — Encounter: Payer: Self-pay | Admitting: Podiatry

## 2022-05-11 DIAGNOSIS — Z9889 Other specified postprocedural states: Secondary | ICD-10-CM

## 2022-05-11 DIAGNOSIS — S86312D Strain of muscle(s) and tendon(s) of peroneal muscle group at lower leg level, left leg, subsequent encounter: Secondary | ICD-10-CM | POA: Diagnosis not present

## 2022-05-11 NOTE — Progress Notes (Signed)
Adreana presents today for follow-up of her peroneal tendon repair date of surgery 03/04/2022 and states that she is doing just fine no problems whatsoever and no pain.  She continues to wear her cam boot she walks slowly and she uses a even up on the right foot.  She states that she would like to go to physical therapy to help strengthen her left leg now.  Objective: Vital signs are stable she is alert and oriented x3 pulses are palpable.  No edema no erythema cellulitis drainage or odor incision site is gone on to heal very nicely I see no signs of inflammation on palpation of the tendon she has good eversion against resistance and plantarflexion and eversion.  She has no pain on abduction against resistance.  She is slightly tight on inversion of the subtalar joint laterally.  But this is without tenderness.  Assessment: Well-healing surgical foot.  Plan: I am going to send her to physical therapy for strengthening.  We will also put her in a Tri-Lock brace and will allow her to get back into her tennis shoe.  She knows not to overdo it we discussed this in great detail today.  States that she is going to the beach we discussed walking in the sand with the Tri-Lock brace and a tennis shoe.

## 2022-06-29 ENCOUNTER — Ambulatory Visit (INDEPENDENT_AMBULATORY_CARE_PROVIDER_SITE_OTHER): Payer: 59 | Admitting: Family

## 2022-06-29 ENCOUNTER — Encounter: Payer: Self-pay | Admitting: Family

## 2022-06-29 ENCOUNTER — Other Ambulatory Visit: Payer: Self-pay

## 2022-06-29 ENCOUNTER — Telehealth: Payer: Self-pay | Admitting: Family

## 2022-06-29 VITALS — BP 124/82 | HR 76 | Temp 97.9°F | Ht 66.5 in | Wt 178.4 lb

## 2022-06-29 DIAGNOSIS — F331 Major depressive disorder, recurrent, moderate: Secondary | ICD-10-CM

## 2022-06-29 DIAGNOSIS — Z0000 Encounter for general adult medical examination without abnormal findings: Secondary | ICD-10-CM

## 2022-06-29 DIAGNOSIS — Z1231 Encounter for screening mammogram for malignant neoplasm of breast: Secondary | ICD-10-CM | POA: Diagnosis not present

## 2022-06-29 DIAGNOSIS — F32A Depression, unspecified: Secondary | ICD-10-CM

## 2022-06-29 DIAGNOSIS — I10 Essential (primary) hypertension: Secondary | ICD-10-CM

## 2022-06-29 LAB — CBC WITH DIFFERENTIAL/PLATELET
Basophils Absolute: 0 10*3/uL (ref 0.0–0.1)
Basophils Relative: 0.6 % (ref 0.0–3.0)
Eosinophils Absolute: 0.1 10*3/uL (ref 0.0–0.7)
Eosinophils Relative: 1.6 % (ref 0.0–5.0)
HCT: 43.4 % (ref 36.0–46.0)
Hemoglobin: 14.4 g/dL (ref 12.0–15.0)
Lymphocytes Relative: 30.3 % (ref 12.0–46.0)
Lymphs Abs: 1.2 10*3/uL (ref 0.7–4.0)
MCHC: 33.3 g/dL (ref 30.0–36.0)
MCV: 95.9 fl (ref 78.0–100.0)
Monocytes Absolute: 0.4 10*3/uL (ref 0.1–1.0)
Monocytes Relative: 10.4 % (ref 3.0–12.0)
Neutro Abs: 2.3 10*3/uL (ref 1.4–7.7)
Neutrophils Relative %: 57.1 % (ref 43.0–77.0)
Platelets: 291 10*3/uL (ref 150.0–400.0)
RBC: 4.53 Mil/uL (ref 3.87–5.11)
RDW: 13.8 % (ref 11.5–15.5)
WBC: 4.1 10*3/uL (ref 4.0–10.5)

## 2022-06-29 LAB — LIPID PANEL
Cholesterol: 228 mg/dL — ABNORMAL HIGH (ref 0–200)
HDL: 59.3 mg/dL (ref >39.00)
LDL Cholesterol: 137 mg/dL — ABNORMAL HIGH (ref 0–99)
NonHDL: 168.91
Total CHOL/HDL Ratio: 4
Triglycerides: 161 mg/dL — ABNORMAL HIGH (ref 0.0–149.0)
VLDL: 32.2 mg/dL (ref 0.0–40.0)

## 2022-06-29 LAB — VITAMIN D 25 HYDROXY (VIT D DEFICIENCY, FRACTURES): VITD: 51.79 ng/mL (ref 30.00–100.00)

## 2022-06-29 LAB — TSH: TSH: 1.73 u[IU]/mL (ref 0.35–5.50)

## 2022-06-29 LAB — HEMOGLOBIN A1C: Hgb A1c MFr Bld: 5 % (ref 4.6–6.5)

## 2022-06-29 MED ORDER — BUPROPION HCL ER (XL) 300 MG PO TB24: 300.0000 mg | ORAL_TABLET | Freq: Every day | ORAL | 3 refills | Status: DC

## 2022-06-29 MED ORDER — LISINOPRIL 20 MG PO TABS: 20.0000 mg | ORAL_TABLET | Freq: Every day | ORAL | 3 refills | Status: DC

## 2022-06-29 NOTE — Assessment & Plan Note (Signed)
Pap smear is up-to-date in the absence of complaints, patient deferred pelvic exam today.  Patient will schedule mammogram.  Encourage continued follow-up with dermatology.

## 2022-06-29 NOTE — Assessment & Plan Note (Signed)
Chronic stable.  Continue lisinopril 20 mg

## 2022-06-29 NOTE — Progress Notes (Signed)
Subjective:    Patient ID: Jill Atkinson, female    DOB: 03-06-1966, 56 y.o.   MRN: [id number]  CC: Jill Atkinson is a 56 y.o. female who presents today for physical exam.    HPI: Feels well today.  No new concerns  She continues to work from home with can be isolating.  She is working with [facility] which is helpful.  She is compliant with Lexapro 10 mg, Wellbutrin 300 mg overall feels is working well for her.  She had done counseling in the past    Colorectal Cancer Screening: UTD 05/13/21 , Jill Atkinson ; able to find report under media tab.  Repeat in 10 years breast Cancer Screening: Mammogram due Cervical Cancer Screening: UTD, done 4/ 21/21 malignancy and HPV.  Previous Pap 11 09/05/2018 showed benign reparative changes, negative HPV  Labs: Screening labs today. Exercise: No regular exercise at this time due to recent ankle surgery. She is currently in PT.   Alcohol use: Occasional Smoking/tobacco use: Nonsmoker.    No new or concerning skin lesions.  She follows biannually with dermatology  HISTORY:  Past Medical History:  Diagnosis Date   Allergy    hay fever   Anxiety    Arrhythmia    AVNRT, s/p ablation Jill. Westley Gambles at [facility]    exercise induced   Chicken pox    Depression    post partum   Food allergy    GERD (gastroesophageal reflux disease)    Hypertension    IBS (irritable bowel syndrome)    Palpitations    Reflux esophagitis    Shingles    Shortness of breath    Swelling of both lower extremities    UTI (lower urinary tract infection)     Past Surgical History:  Procedure Laterality Date   CARDIAC ELECTROPHYSIOLOGY STUDY AND ABLATION  2012    COLONOSCOPY WITH PROPOFOL N/A 11/02/2015   Procedure: COLONOSCOPY WITH PROPOFOL;  Surgeon: Jill Silvas, MD;  Location: [facility];  Service: Endoscopy;  Laterality: N/A;   DILATION AND CURETTAGE OF UTERUS  1995   post delivery   NASAL SINUS SURGERY  2006   TONSILLECTOMY  1978    Family History  Problem Relation Age of Onset   Arthritis Mother    Hypertension Mother    Thyroid disease Mother    Arthritis Father    Alcohol abuse Maternal Grandmother    Drug abuse Maternal Grandmother    Arthritis Maternal Grandmother    Stroke Maternal Grandmother    Hypertension Maternal Grandmother    Alcohol abuse Maternal Grandfather    Drug abuse Maternal Grandfather    Arthritis Maternal Grandfather    Hypertension Maternal Grandfather    Breast cancer Neg Hx       ALLERGIES: Bactrim [sulfamethoxazole-trimethoprim]  Current Outpatient Medications on File Prior to Visit  Medication Sig Dispense Refill   albuterol (VENTOLIN HFA) 108 (90 Base) MCG/ACT inhaler Inhale 2 puffs into the lungs every 6 (six) hours as needed for wheezing. 18 g 3   Cholecalciferol (D3-1000) 25 MCG (1000 UT) tablet      escitalopram (LEXAPRO) 10 MG tablet TAKE 1 TABLET BY MOUTH EVERY DAY 90 tablet 1   famotidine (PEPCID) 10 MG tablet Take 10 mg by mouth 2 (two) times daily.     Ferrous Sulfate (IRON) 28 MG TABS      Fluticasone Propionate (FLONASE NA) Place into the nose.     Levocetirizine Dihydrochloride (XYZAL ALLERGY  24HR PO) Take by mouth daily.     ondansetron (ZOFRAN) 4 MG tablet Take 1 tablet (4 mg total) by mouth every 8 (eight) hours as needed. 20 tablet 0   No current facility-administered medications on file prior to visit.    Social History   Tobacco Use   Smoking status: Never   Smokeless tobacco: Never  Vaping Use   Vaping Use: Never used  Substance Use Topics   Alcohol use: Yes    Alcohol/week: 2.0 standard drinks of alcohol    Types: 2 Glasses of wine per week    Comment: GLASS OF WINE NIGHTLY   Drug use: No    Review of Systems  Constitutional:  Negative for chills, fever and unexpected weight change.  HENT:  Negative for congestion.   Respiratory:  Negative for cough.   Cardiovascular:  Negative for chest pain, palpitations and leg swelling.   Gastrointestinal:  Negative for nausea and vomiting.  Musculoskeletal:  Negative for arthralgias and myalgias.  Skin:  Negative for rash.  Neurological:  Negative for headaches.  Hematological:  Negative for adenopathy.  Psychiatric/Behavioral:  Negative for confusion.       Objective:    BP 124/82 (BP Location: Left Arm, Patient Position: Sitting, Cuff Size: Normal)   Pulse 76   Temp 97.9 F (36.6 C) (Oral)   Ht 5' 6.5" (1.689 m)   Wt 178 lb 6.4 oz (80.9 kg)   LMP 05/26/2016   SpO2 99%   BMI 28.36 kg/m   BP Readings from Last 3 Encounters:  06/29/22 124/82  03/09/22 (!) 154/83  06/08/21 132/90   Wt Readings from Last 3 Encounters:  06/29/22 178 lb 6.4 oz (80.9 kg)  06/08/21 169 lb 6.4 oz (76.8 kg)  12/09/20 165 lb (74.8 kg)    Physical Exam Vitals reviewed.  Constitutional:      Appearance: Normal appearance. She is well-developed.  Eyes:     Conjunctiva/sclera: Conjunctivae normal.  Neck:     Thyroid: No thyroid mass or thyromegaly.  Cardiovascular:     Rate and Rhythm: Normal rate and regular rhythm.     Pulses: Normal pulses.     Heart sounds: Normal heart sounds.  Pulmonary:     Effort: Pulmonary effort is normal.     Breath sounds: Normal breath sounds. No wheezing, rhonchi or rales.  Chest:  Breasts:    Breasts are symmetrical.     Right: No inverted nipple, mass, nipple discharge, skin change or tenderness.     Left: No inverted nipple, mass, nipple discharge, skin change or tenderness.  Abdominal:     General: Bowel sounds are normal. There is no distension.     Palpations: Abdomen is soft. Abdomen is not rigid. There is no fluid wave or mass.     Tenderness: There is no abdominal tenderness. There is no guarding or rebound.  Lymphadenopathy:     Head:     Right side of head: No submental, submandibular, tonsillar, preauricular, posterior auricular or occipital adenopathy.     Left side of head: No submental, submandibular, tonsillar,  preauricular, posterior auricular or occipital adenopathy.     Cervical: No cervical adenopathy.     Right cervical: No superficial, deep or posterior cervical adenopathy.    Left cervical: No superficial, deep or posterior cervical adenopathy.  Skin:    General: Skin is warm and dry.  Neurological:     Mental Status: She is alert.  Psychiatric:  Speech: Speech normal.        Behavior: Behavior normal.        Thought Content: Thought content normal.        Assessment & Plan:   Problem List Items Addressed This Visit       Cardiovascular and Mediastinum   Hypertension    Chronic stable.  Continue lisinopril 20 mg        Other   Depression    Overall stable.  Discussed being isolated with working from home.  Discussed increasing Lexapro from 10 mg to 15 mg and also referral back to counseling, Alene Mires, whom she has seen in the past.  She politely delines at this time and she will consider this option if she feels that she needs.  For now, we will continue Wellbutrin 100 mg, Lexapro 10 mg      Routine general medical examination at a health care facility - Primary    Pap smear is up-to-date in the absence of complaints, patient deferred pelvic exam today.  Patient will schedule mammogram.  Encourage continued follow-up with dermatology.      Relevant Orders   CBC with Differential/Platelet   Hemoglobin A1c   Lipid panel   TSH   VITAMIN D 25 Hydroxy (Vit-D Deficiency, Fractures)   Screening for breast cancer   Relevant Orders   MM 3D SCREEN BREAST BILATERAL     I am having Lanice Schwab. Joya Gaskins maintain her Fluticasone Propionate (FLONASE NA), Levocetirizine Dihydrochloride (XYZAL ALLERGY 24HR PO), albuterol, famotidine, Iron, D3-1000, escitalopram, and ondansetron.   No orders of the defined types were placed in this encounter.   Return precautions given.   Risks, benefits, and alternatives of the medications and treatment plan prescribed today were discussed,  and patient expressed understanding.   Education regarding symptom management and diagnosis given to patient on AVS.   Continue to follow with Burnard Hawthorne, FNP for routine health maintenance.   Jill Atkinson and I agreed with plan.   Mable Paris, FNP

## 2022-06-29 NOTE — Patient Instructions (Signed)
Nice to see you!  Health Maintenance for Postmenopausal Women Menopause is a normal process in which your ability to get pregnant comes to an end. This process happens slowly over many months or years, usually between the ages of 36 and 48. Menopause is complete when you have missed your menstrual period for 12 months. It is important to talk with your health care provider about some of the most common conditions that affect women after menopause (postmenopausal women). These include heart disease, cancer, and bone loss (osteoporosis). Adopting a healthy lifestyle and getting preventive care can help to promote your health and wellness. The actions you take can also lower your chances of developing some of these common conditions. What are the signs and symptoms of menopause? During menopause, you may have the following symptoms: Hot flashes. These can be moderate or severe. Night sweats. Decrease in sex drive. Mood swings. Headaches. Tiredness (fatigue). Irritability. Memory problems. Problems falling asleep or staying asleep. Talk with your health care provider about treatment options for your symptoms. Do I need hormone replacement therapy? Hormone replacement therapy is effective in treating symptoms that are caused by menopause, such as hot flashes and night sweats. Hormone replacement carries certain risks, especially as you become older. If you are thinking about using estrogen or estrogen with progestin, discuss the benefits and risks with your health care provider. How can I reduce my risk for heart disease and stroke? The risk of heart disease, heart attack, and stroke increases as you age. One of the causes may be a change in the body's hormones during menopause. This can affect how your body uses dietary fats, triglycerides, and cholesterol. Heart attack and stroke are medical emergencies. There are many things that you can do to help prevent heart disease and stroke. Watch your  blood pressure High blood pressure causes heart disease and increases the risk of stroke. This is more likely to develop in people who have high blood pressure readings or are overweight. Have your blood pressure checked: Every 3-5 years if you are 68-11 years of age. Every year if you are 29 years old or older. Eat a healthy diet  Eat a diet that includes plenty of vegetables, fruits, low-fat dairy products, and lean protein. Do not eat a lot of foods that are high in solid fats, added sugars, or sodium. Get regular exercise Get regular exercise. This is one of the most important things you can do for your health. Most adults should: Try to exercise for at least 150 minutes each week. The exercise should increase your heart rate and make you sweat (moderate-intensity exercise). Try to do strengthening exercises at least twice each week. Do these in addition to the moderate-intensity exercise. Spend less time sitting. Even light physical activity can be beneficial. Other tips Work with your health care provider to achieve or maintain a healthy weight. Do not use any products that contain nicotine or tobacco. These products include cigarettes, chewing tobacco, and vaping devices, such as e-cigarettes. If you need help quitting, ask your health care provider. Know your numbers. Ask your health care provider to check your cholesterol and your blood sugar (glucose). Continue to have your blood tested as directed by your health care provider. Do I need screening for cancer? Depending on your health history and family history, you may need to have cancer screenings at different stages of your life. This may include screening for: Breast cancer. Cervical cancer. Lung cancer. Colorectal cancer. What is my risk for osteoporosis?  After menopause, you may be at increased risk for osteoporosis. Osteoporosis is a condition in which bone destruction happens more quickly than new bone creation. To help  prevent osteoporosis or the bone fractures that can happen because of osteoporosis, you may take the following actions: If you are 58-89 years old, get at least 1,000 mg of calcium and at least 600 international units (IU) of vitamin D per day. If you are older than age 51 but younger than age 31, get at least 1,200 mg of calcium and at least 600 international units (IU) of vitamin D per day. If you are older than age 93, get at least 1,200 mg of calcium and at least 800 international units (IU) of vitamin D per day. Smoking and drinking excessive alcohol increase the risk of osteoporosis. Eat foods that are rich in calcium and vitamin D, and do weight-bearing exercises several times each week as directed by your health care provider. How does menopause affect my mental health? Depression may occur at any age, but it is more common as you become older. Common symptoms of depression include: Feeling depressed. Changes in sleep patterns. Changes in appetite or eating patterns. Feeling an overall lack of motivation or enjoyment of activities that you previously enjoyed. Frequent crying spells. Talk with your health care provider if you think that you are experiencing any of these symptoms. General instructions See your health care provider for regular wellness exams and vaccines. This may include: Scheduling regular health, dental, and eye exams. Getting and maintaining your vaccines. These include: Influenza vaccine. Get this vaccine each year before the flu season begins. Pneumonia vaccine. Shingles vaccine. Tetanus, diphtheria, and pertussis (Tdap) booster vaccine. Your health care provider may also recommend other immunizations. Tell your health care provider if you have ever been abused or do not feel safe at home. Summary Menopause is a normal process in which your ability to get pregnant comes to an end. This condition causes hot flashes, night sweats, decreased interest in sex, mood  swings, headaches, or lack of sleep. Treatment for this condition may include hormone replacement therapy. Take actions to keep yourself healthy, including exercising regularly, eating a healthy diet, watching your weight, and checking your blood pressure and blood sugar levels. Get screened for cancer and depression. Make sure that you are up to date with all your vaccines. This information is not intended to replace advice given to you by your health care provider. Make sure you discuss any questions you have with your health care provider. Document Revised: 02/22/2021 Document Reviewed: 02/22/2021 Elsevier Patient Education  [place].

## 2022-06-29 NOTE — Telephone Encounter (Signed)
close

## 2022-06-29 NOTE — Assessment & Plan Note (Signed)
Overall stable.  Discussed being isolated with working from home.  Discussed increasing Lexapro from 10 mg to 15 mg and also referral back to counseling, Alene Mires, whom she has seen in the past.  She politely delines at this time and she will consider this option if she feels that she needs.  For now, we will continue Wellbutrin 100 mg, Lexapro 10 mg

## 2022-10-19 ENCOUNTER — Other Ambulatory Visit: Payer: Self-pay | Admitting: Family

## 2022-10-19 DIAGNOSIS — F32A Depression, unspecified: Secondary | ICD-10-CM

## 2022-12-21 ENCOUNTER — Ambulatory Visit: Payer: 59 | Admitting: Podiatry

## 2022-12-27 ENCOUNTER — Ambulatory Visit (INDEPENDENT_AMBULATORY_CARE_PROVIDER_SITE_OTHER): Payer: 59 | Admitting: Podiatry

## 2022-12-27 ENCOUNTER — Encounter: Payer: Self-pay | Admitting: Podiatry

## 2022-12-27 DIAGNOSIS — S86312D Strain of muscle(s) and tendon(s) of peroneal muscle group at lower leg level, left leg, subsequent encounter: Secondary | ICD-10-CM

## 2022-12-27 NOTE — Progress Notes (Signed)
She presents today for follow-up of her peroneal tendon left foot.  States that it is better and stronger than it was but still little discomfort occasionally.  She is most to make sure she is doing okay so that she can go hiking through [place].  Objective: Vital signs are stable alert and oriented x 3.  Pulses are palpable.  She has no reproducible pain on palpation though she has some areas of mild tenderness at the peroneal retinaculum and just proximal to that.  Assessment: Well-healing surgical foot.  Plan: Instructed her to take her brace with her when she goes hiking also instructed her to wear taller hiking boots she understands this and is amenable to it we will follow-up with [place] on an as-needed basis.

## 2023-03-17 ENCOUNTER — Ambulatory Visit
Admission: RE | Admit: 2023-03-17 | Discharge: 2023-03-17 | Disposition: A | Discharge status: Home / Self Care | Payer: 59 | Source: Ambulatory Visit | Attending: Family Medicine | Admitting: Family Medicine

## 2023-03-17 ENCOUNTER — Ambulatory Visit (INDEPENDENT_AMBULATORY_CARE_PROVIDER_SITE_OTHER): Payer: 59

## 2023-03-17 VITALS — BP 149/87 | HR 78 | Temp 97.7°F | Ht 66.0 in | Wt 175.0 lb

## 2023-03-17 DIAGNOSIS — J4521 Mild intermittent asthma with (acute) exacerbation: Secondary | ICD-10-CM | POA: Diagnosis not present

## 2023-03-17 DIAGNOSIS — J209 Acute bronchitis, unspecified: Secondary | ICD-10-CM

## 2023-03-17 DIAGNOSIS — J45990 Exercise induced bronchospasm: Secondary | ICD-10-CM

## 2023-03-17 MED ORDER — ALBUTEROL SULFATE HFA 108 (90 BASE) MCG/ACT IN AERS: 2.0000 | INHALATION_SPRAY | Freq: Four times a day (QID) | RESPIRATORY_TRACT | 3 refills | Status: AC | PRN

## 2023-03-17 MED ORDER — AZITHROMYCIN 250 MG PO TABS: ORAL_TABLET | ORAL | 0 refills | Status: DC

## 2023-03-17 MED ORDER — PREDNISONE 10 MG (21) PO TBPK: ORAL_TABLET | Freq: Every day | ORAL | 0 refills | Status: DC

## 2023-03-17 NOTE — ED Triage Notes (Signed)
Pt c/o Continued cough   Pt states that she is now coughing up clear/yellow phlegm.

## 2023-03-17 NOTE — ED Provider Notes (Signed)
[facility]    CSN: [id number] Arrival date & time: 03/17/23  1350      History   Chief Complaint Chief Complaint  Patient presents with   Cough    HPI DESARE DIERS is a 57 y.o. female.   HPI  History obtained from the patient. Dawt presents for productive cough.  She has been coughing since Mother's Day weekend. She did a telehealth visit and was given 5 days of prednisone and took 10d course amoxicillin.  She is blowing her nose and has production.  She is coughing up yellow phelgm.  Has asthma. Took Pepcid for reflux and took Sudafed without relief.   No one else is sick.   Fever: no Chills: no Sore throat: irritation Cough: yes Sputum: yes Chest tightness: no Shortness of breath: yes Wheezing: no  Nasal congestion: yes Rhinorrhea: no Facial pain: no  Myalgias: no Appetite: normal  Hydration: normal  Abdominal pain: no Nausea: no Vomiting: no Diarrhea: No Rash: No Sleep disturbance: yes Headache: yes     Past Medical History:  Diagnosis Date   Allergy    hay fever   Anxiety    Arrhythmia    AVNRT, s/p ablation Dr. Christin Fudge at [facility]   Asthma    exercise induced   Chicken pox    Depression    post partum   Food allergy    GERD (gastroesophageal reflux disease)    Hypertension    IBS (irritable bowel syndrome)    Palpitations    Reflux esophagitis    Shingles    Shortness of breath    Swelling of both lower extremities    UTI (lower urinary tract infection)     Patient Active Problem List   Diagnosis Date Noted   Left ankle pain 12/09/2020   Arrhythmia    Prediabetes 11/14/2018   Class 1 obesity with serious comorbidity and body mass index (BMI) of 30.0 to 30.9 in adult 11/01/2018   GERD (gastroesophageal reflux disease) 10/29/2018   Cellulitis of right lower extremity 04/05/2017   Sinus congestion 10/26/2016   Cough 08/08/2016   Depression 05/30/2016   Hypertension 08/11/2015   Screening for breast cancer  08/11/2015   Special screening for malignant neoplasms, colon 08/11/2015   Menopausal disorder 09/27/2013   Routine general medical examination at a health care facility 08/06/2013   Exercise-induced asthma 08/02/2012   History of prior ablation treatment 08/02/2012    Past Surgical History:  Procedure Laterality Date   CARDIAC ELECTROPHYSIOLOGY STUDY AND ABLATION  2012    COLONOSCOPY WITH PROPOFOL N/A 11/02/2015   Procedure: COLONOSCOPY WITH PROPOFOL;  Surgeon: Scot Jun, MD;  Location: [facility] ENDOSCOPY;  Service: Endoscopy;  Laterality: N/A;   DILATION AND CURETTAGE OF UTERUS  1995   post delivery   NASAL SINUS SURGERY  2006   TONSILLECTOMY  1978    OB History     Gravida  2   Para  2   Term      Preterm      AB      Living         SAB      IAB      Ectopic      Multiple      Live Births               Home Medications    Prior to Admission medications   Medication Sig Start Date End Date Taking? Authorizing Provider  azithromycin (ZITHROMAX Z-PAK)  250 MG tablet Take 2 tablets on day 1 then 1 tablet daily 03/17/23  Yes Demarea Lorey, DO  buPROPion (WELLBUTRIN XL) 300 MG 24 hr tablet Take 1 tablet (300 mg total) by mouth daily. 06/29/22  Yes Allegra Grana, FNP  Cholecalciferol (D3-1000) 25 MCG (1000 UT) tablet  10/22/18  Yes [provider]  escitalopram (LEXAPRO) 10 MG tablet TAKE 1 TABLET BY MOUTH EVERY DAY 10/20/22  Yes Allegra Grana, FNP  famotidine (PEPCID) 10 MG tablet Take 10 mg by mouth 2 (two) times daily.   Yes [provider]  Ferrous Sulfate (IRON) 28 MG TABS  11/15/20  Yes [provider]  Fluticasone Propionate (FLONASE NA) Place into the nose.   Yes [provider]  Levocetirizine Dihydrochloride (XYZAL ALLERGY 24HR PO) Take by mouth daily.   Yes [provider]  lisinopril (ZESTRIL) 20 MG tablet Take 1 tablet (20 mg total) by mouth daily. 06/29/22  Yes Allegra Grana, FNP   meloxicam (MOBIC) 15 MG tablet Take 15 mg by mouth daily. 10/19/22  Yes [provider]  predniSONE (STERAPRED UNI-PAK 21 TAB) 10 MG (21) TBPK tablet Take by mouth daily. Take 6 tabs by mouth daily for 1, then 5 tabs for 1 day, then 4 tabs for 1 day, then 3 tabs for 1 day, then 2 tabs for 1 day, then 1 tab for 1 day. 03/17/23  Yes Chrislynn Mosely, DO  albuterol (VENTOLIN HFA) 108 (90 Base) MCG/ACT inhaler Inhale 2 puffs into the lungs every 6 (six) hours as needed for wheezing. 03/17/23   Katha Cabal, DO    Family History Family History  Problem Relation Age of Onset   Arthritis Mother    Hypertension Mother    Thyroid disease Mother    Arthritis Father    Alcohol abuse Maternal Grandmother    Drug abuse Maternal Grandmother    Arthritis Maternal Grandmother    Stroke Maternal Grandmother    Hypertension Maternal Grandmother    Alcohol abuse Maternal Grandfather    Drug abuse Maternal Grandfather    Arthritis Maternal Grandfather    Hypertension Maternal Grandfather    Breast cancer Neg Hx     Social History Social History   Tobacco Use   Smoking status: Never   Smokeless tobacco: Never  Vaping Use   Vaping Use: Never used  Substance Use Topics   Alcohol use: Yes    Alcohol/week: 2.0 standard drinks of alcohol    Types: 2 Glasses of wine per week    Comment: GLASS OF WINE NIGHTLY   Drug use: No     Allergies   Bactrim [sulfamethoxazole-trimethoprim]   Review of Systems Review of Systems: negative unless otherwise stated in HPI.      Physical Exam Triage Vital Signs ED Triage Vitals  Enc Vitals Group     BP 03/17/23 1357 (!) 149/87     Pulse Rate 03/17/23 1357 78     Resp --      Temp 03/17/23 1357 97.7 F (36.5 C)     Temp Source 03/17/23 1357 Oral     SpO2 03/17/23 1357 100 %     Weight 03/17/23 1356 175 lb (79.4 kg)     Height 03/17/23 1356 5\' 6"  (1.676 m)     Head Circumference --      Peak Flow --      Pain Score 03/17/23 1356 0      Pain Loc --      Pain  Edu? --      Excl. in GC? --    No data found.  Updated Vital Signs BP (!) 149/87 (BP Location: Left Arm)   Pulse 78   Temp 97.7 F (36.5 C) (Oral)   Ht 5\' 6"  (1.676 m)   Wt 79.4 kg   LMP 05/26/2016   SpO2 100%   BMI 28.25 kg/m   Visual Acuity Right Eye Distance:   Left Eye Distance:   Bilateral Distance:    Right Eye Near:   Left Eye Near:    Bilateral Near:     Physical Exam GEN:     alert, non-toxic appearing female in no distress    HENT:  mucus membranes moist, oropharyngeal without lesions or erythema, no tonsillar hypertrophy or exudates, no visible nasal discharge, bilateral TM normal EYES:   pupils equal and reactive, no scleral injection or discharge NECK:  normal ROM, no meningismus   RESP:  no increased work of breathing, clear to auscultation bilaterally CVS:   regular rate and rhythm Skin:   warm and dry, no rash on visible skin    UC Treatments / Results  Labs (all labs ordered are listed, but only abnormal results are displayed) Labs Reviewed - No data to display  EKG   Radiology No results found.  Procedures Procedures (including critical care time)  Medications Ordered in UC Medications - No data to display  Initial Impression / Assessment and Plan / UC Course  I have reviewed the triage vital signs and the nursing notes.  Pertinent labs & imaging results that were available during my care of the patient were reviewed by me and considered in my medical decision making (see chart for details).       Pt is a 57 y.o. female with hx of asthma who presents for 3 weeks of cough that is not improving.  Delecia is  afebrile here without recent antipyretics. Satting well on room air. Overall pt is  non-toxic appearing, well hydrated, without respiratory distress. Pulmonary exam is unremarkable except for frequent cough.  After shared decision making, we will  pursue chest x-ray. COVID testing deferred due to length of  symptoms. Home COVID testing was negative.   Treat acute asthmatic bronchitis with steroids and antibiotics as below. Albuterol inhaler sent for bronchospasm.  Typical duration of symptoms discussed. Return and ED precautions given and patient voiced understanding.   Discussed MDM, treatment plan and plan for follow-up with patient who agrees with plan.      Final Clinical Impressions(s) / UC Diagnoses   Final diagnoses:  Acute bronchitis, unspecified organism  Mild intermittent asthma with exacerbation     Discharge Instructions      Your chest xray did not show pneumonia.  I suspect you have bronchitis. Take Azithromycin and prednisone as prescribed. Use the albuterol inhaler as needed for coughing fits and shortness of breath.    Follow up with your primary care provider.      ED Prescriptions     Medication Sig Dispense Auth. Provider   azithromycin (ZITHROMAX Z-PAK) 250 MG tablet Take 2 tablets on day 1 then 1 tablet daily 6 tablet Avon Molock, DO   predniSONE (STERAPRED UNI-PAK 21 TAB) 10 MG (21) TBPK tablet Take by mouth daily. Take 6 tabs by mouth daily for 1, then 5 tabs for 1 day, then 4 tabs for 1 day, then 3 tabs for 1 day, then 2 tabs for 1 day, then 1 tab for 1 day. 21  tablet Ruairi Stutsman, DO   albuterol (VENTOLIN HFA) 108 (90 Base) MCG/ACT inhaler Inhale 2 puffs into the lungs every 6 (six) hours as needed for wheezing. 18 g Katha Cabal, DO      PDMP not reviewed this encounter.   Katha Cabal, DO 03/17/23 1459

## 2023-03-17 NOTE — Discharge Instructions (Addendum)
Your chest xray did not show pneumonia.  I suspect you have bronchitis. Take Azithromycin and prednisone as prescribed. Use the albuterol inhaler as needed for coughing fits and shortness of breath.    Follow up with your primary care provider.

## 2023-04-26 ENCOUNTER — Other Ambulatory Visit: Payer: Self-pay | Admitting: Family

## 2023-04-26 DIAGNOSIS — F32A Depression, unspecified: Secondary | ICD-10-CM

## 2023-05-09 ENCOUNTER — Ambulatory Visit
Admission: RE | Admit: 2023-05-09 | Discharge: 2023-05-09 | Disposition: A | Discharge status: Home / Self Care | Payer: 59 | Source: Ambulatory Visit | Attending: Family | Admitting: Family

## 2023-05-09 DIAGNOSIS — Z1231 Encounter for screening mammogram for malignant neoplasm of breast: Secondary | ICD-10-CM | POA: Diagnosis present

## 2023-06-01 ENCOUNTER — Encounter (INDEPENDENT_AMBULATORY_CARE_PROVIDER_SITE_OTHER): Payer: Self-pay

## 2023-06-02 ENCOUNTER — Other Ambulatory Visit: Payer: Self-pay | Admitting: Oncology

## 2023-06-02 DIAGNOSIS — Z006 Encounter for examination for normal comparison and control in clinical research program: Secondary | ICD-10-CM

## 2023-06-29 ENCOUNTER — Encounter: Payer: Self-pay | Admitting: Family

## 2023-07-04 ENCOUNTER — Other Ambulatory Visit (HOSPITAL_COMMUNITY)
Admission: RE | Admit: 2023-07-04 | Discharge: 2023-07-04 | Disposition: A | Discharge status: Home / Self Care | Payer: 59 | Source: Ambulatory Visit | Attending: Family | Admitting: Family

## 2023-07-04 ENCOUNTER — Encounter: Payer: Self-pay | Admitting: Family

## 2023-07-04 ENCOUNTER — Ambulatory Visit (INDEPENDENT_AMBULATORY_CARE_PROVIDER_SITE_OTHER): Payer: 59 | Admitting: Family

## 2023-07-04 VITALS — BP 126/70 | HR 76 | Temp 98.0°F | Ht 66.0 in | Wt 183.6 lb

## 2023-07-04 DIAGNOSIS — F32A Depression, unspecified: Secondary | ICD-10-CM | POA: Diagnosis not present

## 2023-07-04 DIAGNOSIS — Z13220 Encounter for screening for lipoid disorders: Secondary | ICD-10-CM | POA: Diagnosis not present

## 2023-07-04 DIAGNOSIS — I159 Secondary hypertension, unspecified: Secondary | ICD-10-CM | POA: Diagnosis not present

## 2023-07-04 DIAGNOSIS — Z0000 Encounter for general adult medical examination without abnormal findings: Secondary | ICD-10-CM | POA: Insufficient documentation

## 2023-07-04 DIAGNOSIS — Z23 Encounter for immunization: Secondary | ICD-10-CM | POA: Diagnosis not present

## 2023-07-04 DIAGNOSIS — Z87898 Personal history of other specified conditions: Secondary | ICD-10-CM | POA: Diagnosis not present

## 2023-07-04 DIAGNOSIS — I10 Essential (primary) hypertension: Secondary | ICD-10-CM

## 2023-07-04 DIAGNOSIS — Z136 Encounter for screening for cardiovascular disorders: Secondary | ICD-10-CM

## 2023-07-04 DIAGNOSIS — M1990 Unspecified osteoarthritis, unspecified site: Secondary | ICD-10-CM | POA: Insufficient documentation

## 2023-07-04 LAB — COMPREHENSIVE METABOLIC PANEL WITH GFR
ALT: 23 U/L (ref 0–35)
AST: 21 U/L (ref 0–37)
Albumin: 4.3 g/dL (ref 3.5–5.2)
Alkaline Phosphatase: 77 U/L (ref 39–117)
BUN: 12 mg/dL (ref 6–23)
CO2: 25 meq/L (ref 19–32)
Calcium: 9.5 mg/dL (ref 8.4–10.5)
Chloride: 103 meq/L (ref 96–112)
Creatinine, Ser: 0.8 mg/dL (ref 0.40–1.20)
GFR: 80.87 mL/min
Glucose, Bld: 98 mg/dL (ref 70–99)
Potassium: 4.3 meq/L (ref 3.5–5.1)
Sodium: 138 meq/L (ref 135–145)
Total Bilirubin: 0.3 mg/dL (ref 0.2–1.2)
Total Protein: 6.8 g/dL (ref 6.0–8.3)

## 2023-07-04 LAB — CBC WITH DIFFERENTIAL/PLATELET
Basophils Absolute: 0 10*3/uL (ref 0.0–0.1)
Basophils Relative: 0.5 % (ref 0.0–3.0)
Eosinophils Absolute: 0.1 10*3/uL (ref 0.0–0.7)
Eosinophils Relative: 1.1 % (ref 0.0–5.0)
HCT: 47.4 % — ABNORMAL HIGH (ref 36.0–46.0)
Hemoglobin: 15.5 g/dL — ABNORMAL HIGH (ref 12.0–15.0)
Lymphocytes Relative: 27.4 % (ref 12.0–46.0)
Lymphs Abs: 1.5 10*3/uL (ref 0.7–4.0)
MCHC: 32.8 g/dL (ref 30.0–36.0)
MCV: 97.7 fl (ref 78.0–100.0)
Monocytes Absolute: 0.5 10*3/uL (ref 0.1–1.0)
Monocytes Relative: 9.9 % (ref 3.0–12.0)
Neutro Abs: 3.3 10*3/uL (ref 1.4–7.7)
Neutrophils Relative %: 61.1 % (ref 43.0–77.0)
Platelets: 302 10*3/uL (ref 150.0–400.0)
RBC: 4.85 Mil/uL (ref 3.87–5.11)
RDW: 12.6 % (ref 11.5–15.5)
WBC: 5.3 10*3/uL (ref 4.0–10.5)

## 2023-07-04 LAB — VITAMIN D 25 HYDROXY (VIT D DEFICIENCY, FRACTURES): VITD: 43.84 ng/mL (ref 30.00–100.00)

## 2023-07-04 LAB — LIPID PANEL
Cholesterol: 246 mg/dL — ABNORMAL HIGH (ref 0–200)
HDL: 69.1 mg/dL (ref >39.00)
LDL Cholesterol: 154 mg/dL — ABNORMAL HIGH (ref 0–99)
NonHDL: 177.17
Total CHOL/HDL Ratio: 4
Triglycerides: 116 mg/dL (ref 0.0–149.0)
VLDL: 23.2 mg/dL (ref 0.0–40.0)

## 2023-07-04 LAB — HEMOGLOBIN A1C: Hgb A1c MFr Bld: 5.3 % (ref 4.6–6.5)

## 2023-07-04 LAB — TSH: TSH: 2.27 u[IU]/mL (ref 0.35–5.50)

## 2023-07-04 MED ORDER — BUPROPION HCL ER (XL) 300 MG PO TB24
300.0000 mg | ORAL_TABLET | Freq: Every day | ORAL | 3 refills | Status: DC
Start: 2023-07-04 — End: 2024-07-04

## 2023-07-04 MED ORDER — ESCITALOPRAM OXALATE 10 MG PO TABS
10.0000 mg | ORAL_TABLET | Freq: Every day | ORAL | 3 refills | Status: DC
Start: 2023-07-04 — End: 2024-07-04

## 2023-07-04 MED ORDER — LISINOPRIL 20 MG PO TABS
20.0000 mg | ORAL_TABLET | Freq: Every day | ORAL | 3 refills | Status: DC
Start: 2023-07-04 — End: 2024-06-27

## 2023-07-04 NOTE — Progress Notes (Signed)
Assessment & Plan:  Routine general medical examination at a health care facility Assessment & Plan: Clinical breast exam performed, Pap smear obtained.  Encouraged continued exercise  Orders: -     VITAMIN D 25 Hydroxy (Vit-D Deficiency, Fractures) -     CBC with Differential/Platelet -     Comprehensive metabolic panel -     Lipid panel -     TSH -     Hemoglobin A1c -     Cytology - PAP -     Escitalopram Oxalate; Take 1 tablet (10 mg total) by mouth daily.  Dispense: 90 tablet; Refill: 3 -     buPROPion HCl ER (XL); Take 1 tablet (300 mg total) by mouth daily.  Dispense: 90 tablet; Refill: 3 -     Iron, TIBC and Ferritin Panel -     Lisinopril; Take 1 tablet (20 mg total) by mouth daily.  Dispense: 90 tablet; Refill: 3  Depression Assessment & Plan: PHQ score of 0.  Overall stable.  Patient will let me know how things continue to go with work, stress.  Continue Wellbutrin 300 mg daily, Lexapro 10 mg daily   Depression, unspecified depression type Assessment & Plan: PHQ score of 0.  Overall stable.  Patient will let me know how things continue to go with work, stress.  Continue Wellbutrin 300 mg daily, Lexapro 10 mg daily  Orders: -     Escitalopram Oxalate; Take 1 tablet (10 mg total) by mouth daily.  Dispense: 90 tablet; Refill: 3 -     buPROPion HCl ER (XL); Take 1 tablet (300 mg total) by mouth daily.  Dispense: 90 tablet; Refill: 3  Encounter for lipid screening for cardiovascular disease -     Lipid panel  History of prediabetes -     Hemoglobin A1c  Need for influenza vaccination -     Flu Vaccine QUAD 18mo+IM (Fluarix, Fluzone & Alfiuria Quad PF)  Secondary hypertension Assessment & Plan: Chronic stable.  Continue lisinopril 20 mg qd   Essential hypertension -     Lisinopril; Take 1 tablet (20 mg total) by mouth daily.  Dispense: 90 tablet; Refill: 3     Return precautions given.   Risks, benefits, and alternatives of the medications and treatment  plan prescribed today were discussed, and patient expressed understanding.   Education regarding symptom management and diagnosis given to patient on AVS either electronically or printed.  No follow-ups on file.  Jill Plowman, FNP  Subjective:    Patient ID: Jill Atkinson, female    DOB: September 20, 1966, 57 y.o.   MRN: [id number]  CC: Jill Atkinson is a 57 y.o. female who presents today for physical exam.    HPI: Overall feels well today.    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 no swelling, fever, numbness.   she takes PowerRed donation with [employer]; she takes ferrous sulfate 325mg  every day.    She feels Wellbutrin 100 mg daily, Lexapro 10 mg daily is overall working for her.  She declines making any changes to medications at this time.  She does endorse recent work stress  She remains compliant with lisinopril 20 mg  Colorectal Cancer Screening: UTD , 04/2021, Dr Jill Atkinson, repeat in 10 years  Breast Cancer Screening: Mammogram UTD Cervical Cancer Screening: due, previous 02/05/2020 negative malignancy, negative HPV   Bone Health screening/DEXA for 65+: No increased fracture risk. Defer screening at this time  Lung Cancer Screening: Doesn't have 20 year pack year history and age > 22 years yo 57 years        Tetanus - UTD Exercise: Gets regular exercise, working in the yard, ballroom dancing.   Alcohol use: a glass or two wine at night Smoking/tobacco use: Nonsmoker.    Health Maintenance  Topic Date Due   COVID-19 Vaccine (6 - 2023-24 season) 06/18/2023   Pap with HPV screening  02/04/2025   Mammogram  05/08/2025   Colon Cancer Screening  05/14/2031   DTaP/Tdap/Td vaccine (3 - Td or Tdap) 10/20/2032   Flu Shot  Completed   Hepatitis C Screening  Completed   HIV Screening  Completed   Zoster (Shingles) Vaccine  Completed   HPV Vaccine  Aged Out    ALLERGIES: Bactrim  [sulfamethoxazole-trimethoprim]  Current Outpatient Medications on File Prior to Visit  Medication Sig Dispense Refill   albuterol (VENTOLIN HFA) 108 (90 Base) MCG/ACT inhaler Inhale 2 puffs into the lungs every 6 (six) hours as needed for wheezing. 18 g 3   Cholecalciferol (D3-1000) 25 MCG (1000 UT) tablet      famotidine (PEPCID) 10 MG tablet Take 10 mg by mouth 2 (two) times daily.     Ferrous Sulfate (IRON) 28 MG TABS      Fluticasone Propionate (FLONASE NA) Place into the nose.     Levocetirizine Dihydrochloride (XYZAL ALLERGY 24HR PO) Take by mouth daily.     No current facility-administered medications on file prior to visit.    Review of Systems  Constitutional:  Negative for chills, fever and unexpected weight change.  HENT:  Negative for congestion.   Respiratory:  Negative for cough.   Cardiovascular:  Negative for chest pain, palpitations and leg swelling.  Gastrointestinal:  Negative for nausea and vomiting.  Musculoskeletal:  Positive for arthralgias (right hand). Negative for myalgias.  Skin:  Negative for rash.  Neurological:  Negative for headaches.  Hematological:  Negative for adenopathy.  Psychiatric/Behavioral:  Negative for confusion.       Objective:    BP 126/70   Pulse 76   Temp 98 F (36.7 C) (Oral)   Ht 5\' 6"  (1.676 m)   Wt 183 lb 9.6 oz (83.3 kg)   LMP 05/26/2016   SpO2 98%   BMI 29.63 kg/m   BP Readings from Last 3 Encounters:  07/04/23 126/70  03/17/23 (!) 149/87  06/29/22 124/82   Wt Readings from Last 3 Encounters:  07/04/23 183 lb 9.6 oz (83.3 kg)  03/17/23 175 lb (79.4 kg)  06/29/22 178 lb 6.4 oz (80.9 kg)      07/04/2023    8:36 AM 06/29/2022    8:19 AM 06/08/2021    8:22 AM  Depression screen PHQ 2/9  Decreased Interest 0 0 0  Down, Depressed, Hopeless 0 0 0  PHQ - 2 Score 0 0 0  Altered sleeping 0    Tired, decreased energy 0    Change in appetite 0    Feeling bad or failure about yourself  0    Trouble concentrating 0     Moving slowly or fidgety/restless 0    Suicidal thoughts 0    PHQ-9 Score 0    Difficult doing work/chores Not difficult at all       Physical Exam Vitals reviewed.  Constitutional:      Appearance: She is well-developed.  Eyes:     Conjunctiva/sclera: Conjunctivae normal.  Neck:     Thyroid: No thyroid mass  or thyromegaly.  Cardiovascular:     Rate and Rhythm: Normal rate and regular rhythm.     Pulses: Normal pulses.     Heart sounds: Normal heart sounds.  Pulmonary:     Effort: Pulmonary effort is normal.     Breath sounds: Normal breath sounds. No wheezing, rhonchi or rales.  Chest:  Breasts:    Breasts are symmetrical.     Right: No inverted nipple, mass, nipple discharge, skin change or tenderness.     Left: No inverted nipple, mass, nipple discharge, skin change or tenderness.  Genitourinary:    Cervix: No cervical motion tenderness, discharge or friability.     Uterus: Not enlarged, not fixed and not tender.      Adnexa:        Right: No mass, tenderness or fullness.         Left: No mass, tenderness or fullness.       Comments: Pap performed. No CMT. Unable to appreciated ovaries. Musculoskeletal:     Comments: Enlarged PIP joints of bilateral hands.  No pain, erythema, or edema over MCP joint.  Lymphadenopathy:     Head:     Right side of head: No submental, submandibular, tonsillar, preauricular, posterior auricular or occipital adenopathy.     Left side of head: No submental, submandibular, tonsillar, preauricular, posterior auricular or occipital adenopathy.     Cervical:     Right cervical: No superficial, deep or posterior cervical adenopathy.    Left cervical: No superficial, deep or posterior cervical adenopathy.     Upper Body:     Right upper body: No pectoral adenopathy.     Left upper body: No pectoral adenopathy.  Skin:    General: Skin is warm and dry.  Neurological:     Mental Status: She is alert.  Psychiatric:        Speech: Speech  normal.        Behavior: Behavior normal.        Thought Content: Thought content normal.

## 2023-07-04 NOTE — Assessment & Plan Note (Signed)
Chronic stable.  Continue lisinopril 20 mg qd

## 2023-07-04 NOTE — Assessment & Plan Note (Signed)
Clinical breast exam performed, Pap smear obtained.  Encouraged continued exercise

## 2023-07-04 NOTE — Patient Instructions (Signed)
Health Maintenance for Postmenopausal Women Menopause is a normal process in which your ability to get pregnant comes to an end. This process happens slowly over many months or years, usually between the ages of 75 and 45. Menopause is complete when you have missed your menstrual period for 12 months. It is important to talk with your health care provider about some of the most common conditions that affect women after menopause (postmenopausal women). These include heart disease, cancer, and bone loss (osteoporosis). Adopting a healthy lifestyle and getting preventive care can help to promote your health and wellness. The actions you take can also lower your chances of developing some of these common conditions. What are the signs and symptoms of menopause? During menopause, you may have the following symptoms: Hot flashes. These can be moderate or severe. Night sweats. Decrease in sex drive. Mood swings. Headaches. Tiredness (fatigue). Irritability. Memory problems. Problems falling asleep or staying asleep. Talk with your health care provider about treatment options for your symptoms. Do I need hormone replacement therapy? Hormone replacement therapy is effective in treating symptoms that are caused by menopause, such as hot flashes and night sweats. Hormone replacement carries certain risks, especially as you become older. If you are thinking about using estrogen or estrogen with progestin, discuss the benefits and risks with your health care provider. How can I reduce my risk for heart disease and stroke? The risk of heart disease, heart attack, and stroke increases as you age. One of the causes may be a change in the body's hormones during menopause. This can affect how your body uses dietary fats, triglycerides, and cholesterol. Heart attack and stroke are medical emergencies. There are many things that you can do to help prevent heart disease and stroke. Watch your blood pressure High  blood pressure causes heart disease and increases the risk of stroke. This is more likely to develop in people who have high blood pressure readings or are overweight. Have your blood pressure checked: Every 3-5 years if you are 65-53 years of age. Every year if you are 60 years old or older. Eat a healthy diet  Eat a diet that includes plenty of vegetables, fruits, low-fat dairy products, and lean protein. Do not eat a lot of foods that are high in solid fats, added sugars, or sodium. Get regular exercise Get regular exercise. This is one of the most important things you can do for your health. Most adults should: Try to exercise for at least 150 minutes each week. The exercise should increase your heart rate and make you sweat (moderate-intensity exercise). Try to do strengthening exercises at least twice each week. Do these in addition to the moderate-intensity exercise. Spend less time sitting. Even light physical activity can be beneficial. Other tips Work with your health care provider to achieve or maintain a healthy weight. Do not use any products that contain nicotine or tobacco. These products include cigarettes, chewing tobacco, and vaping devices, such as e-cigarettes. If you need help quitting, ask your health care provider. Know your numbers. Ask your health care provider to check your cholesterol and your blood sugar (glucose). Continue to have your blood tested as directed by your health care provider. Do I need screening for cancer? Depending on your health history and family history, you may need to have cancer screenings at different stages of your life. This may include screening for: Breast cancer. Cervical cancer. Lung cancer. Colorectal cancer. What is my risk for osteoporosis? After menopause, you may be  at increased risk for osteoporosis. Osteoporosis is a condition in which bone destruction happens more quickly than new bone creation. To help prevent osteoporosis or  the bone fractures that can happen because of osteoporosis, you may take the following actions: If you are 72-72 years old, get at least 1,000 mg of calcium and at least 600 international units (IU) of vitamin D per day. If you are older than age 63 but younger than age 19, get at least 1,200 mg of calcium and at least 600 international units (IU) of vitamin D per day. If you are older than age 20, get at least 1,200 mg of calcium and at least 800 international units (IU) of vitamin D per day. Smoking and drinking excessive alcohol increase the risk of osteoporosis. Eat foods that are rich in calcium and vitamin D, and do weight-bearing exercises several times each week as directed by your health care provider. How does menopause affect my mental health? Depression may occur at any age, but it is more common as you become older. Common symptoms of depression include: Feeling depressed. Changes in sleep patterns. Changes in appetite or eating patterns. Feeling an overall lack of motivation or enjoyment of activities that you previously enjoyed. Frequent crying spells. Talk with your health care provider if you think that you are experiencing any of these symptoms. General instructions See your health care provider for regular wellness exams and vaccines. This may include: Scheduling regular health, dental, and eye exams. Getting and maintaining your vaccines. These include: Influenza vaccine. Get this vaccine each year before the flu season begins. Pneumonia vaccine. Shingles vaccine. Tetanus, diphtheria, and pertussis (Tdap) booster vaccine. Your health care provider may also recommend other immunizations. Tell your health care provider if you have ever been abused or do not feel safe at home. Summary Menopause is a normal process in which your ability to get pregnant comes to an end. This condition causes hot flashes, night sweats, decreased interest in sex, mood swings, headaches, or lack  of sleep. Treatment for this condition may include hormone replacement therapy. Take actions to keep yourself healthy, including exercising regularly, eating a healthy diet, watching your weight, and checking your blood pressure and blood sugar levels. Get screened for cancer and depression. Make sure that you are up to date with all your vaccines. This information is not intended to replace advice given to you by your health care provider. Make sure you discuss any questions you have with your health care provider. Document Revised: 02/22/2021 Document Reviewed: 02/22/2021 Elsevier Patient Education  2024 [employer].

## 2023-07-04 NOTE — Assessment & Plan Note (Signed)
Osteoarthritis bilateral hands.  Discussed over-the-counter Voltaren gel, premedicating with Tylenol arthritis particularly before crafting.  Patient will let me know if symptoms are not well-controlled and we discussed referral to [facility], Dr. Stephenie Acres

## 2023-07-04 NOTE — Assessment & Plan Note (Signed)
PHQ score of 0.  Overall stable.  Patient will let me know how things continue to go with work, stress.  Continue Wellbutrin 300 mg daily, Lexapro 10 mg daily

## 2023-07-06 ENCOUNTER — Encounter: Payer: Self-pay | Admitting: Family

## 2023-07-10 LAB — CYTOLOGY - PAP
Comment: NEGATIVE
Diagnosis: NEGATIVE
High risk HPV: NEGATIVE

## 2023-07-12 ENCOUNTER — Encounter: Payer: Self-pay | Admitting: *Deleted

## 2023-07-12 ENCOUNTER — Other Ambulatory Visit: Payer: Self-pay | Admitting: *Deleted

## 2023-07-12 DIAGNOSIS — D582 Other hemoglobinopathies: Secondary | ICD-10-CM

## 2023-07-26 ENCOUNTER — Other Ambulatory Visit: Payer: 59

## 2023-07-26 DIAGNOSIS — D582 Other hemoglobinopathies: Secondary | ICD-10-CM

## 2023-07-26 LAB — CBC WITH DIFFERENTIAL/PLATELET
Basophils Absolute: 0 10*3/uL (ref 0.0–0.1)
Basophils Relative: 0.4 % (ref 0.0–3.0)
Eosinophils Absolute: 0.1 10*3/uL (ref 0.0–0.7)
Eosinophils Relative: 1.7 % (ref 0.0–5.0)
HCT: 47.8 % — ABNORMAL HIGH (ref 36.0–46.0)
Hemoglobin: 15.8 g/dL — ABNORMAL HIGH (ref 12.0–15.0)
Lymphocytes Relative: 33.2 % (ref 12.0–46.0)
Lymphs Abs: 1.8 10*3/uL (ref 0.7–4.0)
MCHC: 33 g/dL (ref 30.0–36.0)
MCV: 96.1 fL (ref 78.0–100.0)
Monocytes Absolute: 0.5 10*3/uL (ref 0.1–1.0)
Monocytes Relative: 10 % (ref 3.0–12.0)
Neutro Abs: 3 10*3/uL (ref 1.4–7.7)
Neutrophils Relative %: 54.7 % (ref 43.0–77.0)
Platelets: 318 10*3/uL (ref 150.0–400.0)
RBC: 4.98 Mil/uL (ref 3.87–5.11)
RDW: 12.6 % (ref 11.5–15.5)
WBC: 5.4 10*3/uL (ref 4.0–10.5)

## 2023-07-28 ENCOUNTER — Encounter (INDEPENDENT_AMBULATORY_CARE_PROVIDER_SITE_OTHER): Payer: Self-pay

## 2023-07-28 ENCOUNTER — Other Ambulatory Visit: Payer: Self-pay | Admitting: Family

## 2023-07-28 DIAGNOSIS — D582 Other hemoglobinopathies: Secondary | ICD-10-CM

## 2023-07-28 NOTE — Progress Notes (Signed)
nonsmoker

## 2023-08-03 ENCOUNTER — Other Ambulatory Visit
Admission: RE | Admit: 2023-08-03 | Discharge: 2023-08-03 | Disposition: A | Discharge status: Home / Self Care | Payer: 59 | Source: Ambulatory Visit | Attending: Oncology | Admitting: Oncology

## 2023-08-03 DIAGNOSIS — Z006 Encounter for examination for normal comparison and control in clinical research program: Secondary | ICD-10-CM | POA: Insufficient documentation

## 2023-08-15 LAB — HELIX MOLECULAR SCREEN: Genetic Analysis Overall Interpretation: NEGATIVE

## 2023-08-19 ENCOUNTER — Telehealth: Payer: 59 | Admitting: Family Medicine

## 2023-08-19 DIAGNOSIS — J019 Acute sinusitis, unspecified: Secondary | ICD-10-CM | POA: Diagnosis not present

## 2023-08-19 MED ORDER — FLUTICASONE PROPIONATE 50 MCG/ACT NA SUSP: 2.0000 | Freq: Every day | NASAL | 0 refills | Status: DC

## 2023-08-19 MED ORDER — AMOXICILLIN-POT CLAVULANATE 875-125 MG PO TABS
1.0000 | ORAL_TABLET | Freq: Two times a day (BID) | ORAL | 0 refills | Status: AC
Start: 2023-08-19 — End: 2023-08-29

## 2023-08-19 NOTE — Patient Instructions (Signed)
Jill Atkinson, thank you for joining Reed Pandy, PA-C for today's virtual visit.  While this provider is not your primary care provider (PCP), if your PCP is located in our provider database this encounter information will be shared with them immediately following your visit.   A [facility] MyChart account gives you access to today's visit and all your visits, tests, and labs performed at [facility] " click here if you don't have a [facility] MyChart account or go to [email or web address]  Consent: (Patient) Jill Atkinson provided verbal consent for this virtual visit at the beginning of the encounter.  Current Medications:  Current Outpatient Medications:    amoxicillin-clavulanate (AUGMENTIN) 875-125 MG tablet, Take 1 tablet by mouth 2 (two) times daily for 10 days., Disp: 20 tablet, Rfl: 0   fluticasone (FLONASE) 50 MCG/ACT nasal spray, Place 2 sprays into both nostrils daily for 10 days., Disp: 16 g, Rfl: 0   albuterol (VENTOLIN HFA) 108 (90 Base) MCG/ACT inhaler, Inhale 2 puffs into the lungs every 6 (six) hours as needed for wheezing., Disp: 18 g, Rfl: 3   buPROPion (WELLBUTRIN XL) 300 MG 24 hr tablet, Take 1 tablet (300 mg total) by mouth daily., Disp: 90 tablet, Rfl: 3   Cholecalciferol (D3-1000) 25 MCG (1000 UT) tablet, , Disp: , Rfl:    escitalopram (LEXAPRO) 10 MG tablet, Take 1 tablet (10 mg total) by mouth daily., Disp: 90 tablet, Rfl: 3   famotidine (PEPCID) 10 MG tablet, Take 10 mg by mouth 2 (two) times daily., Disp: , Rfl:    Ferrous Sulfate (IRON) 28 MG TABS, , Disp: , Rfl:    Levocetirizine Dihydrochloride (XYZAL ALLERGY 24HR PO), Take by mouth daily., Disp: , Rfl:    lisinopril (ZESTRIL) 20 MG tablet, Take 1 tablet (20 mg total) by mouth daily., Disp: 90 tablet, Rfl: 3   Medications ordered in this encounter:  Meds ordered this encounter  Medications   amoxicillin-clavulanate (AUGMENTIN) 875-125 MG tablet    Sig: Take 1 tablet by mouth 2  (two) times daily for 10 days.    Dispense:  20 tablet    Refill:  0   fluticasone (FLONASE) 50 MCG/ACT nasal spray    Sig: Place 2 sprays into both nostrils daily for 10 days.    Dispense:  16 g    Refill:  0     *If you need refills on other medications prior to your next appointment, please contact your pharmacy*  Follow-Up: Call back or seek an in-person evaluation if the symptoms worsen or if the condition fails to improve as anticipated.  [facility] [phone]  Other Instructions Sinus Infection, Adult A sinus infection, also called sinusitis, is inflammation of your sinuses. Sinuses are hollow spaces in the bones around your face. Your sinuses are located: Around your eyes. In the middle of your forehead. Behind your nose. In your cheekbones. Mucus normally drains out of your sinuses. When your nasal tissues become inflamed or swollen, mucus can become trapped or blocked. This allows bacteria, viruses, and fungi to grow, which leads to infection. Most infections of the sinuses are caused by a virus. A sinus infection can develop quickly. It can last for up to 4 weeks (acute) or for more than 12 weeks (chronic). A sinus infection often develops after a cold. What are the causes? This condition is caused by anything that creates swelling in the sinuses or stops mucus from draining. This includes: Allergies. Asthma. Infection from bacteria  or viruses. Deformities or blockages in your nose or sinuses. Abnormal growths in the nose (nasal polyps). Pollutants, such as chemicals or irritants in the air. Infection from fungi. This is rare. What increases the risk? You are more likely to develop this condition if you: Have a weak body defense system (immune system). Do a lot of swimming or diving. Overuse nasal sprays. Smoke. What are the signs or symptoms? The main symptoms of this condition are pain and a feeling of pressure around the affected sinuses. Other  symptoms include: Stuffy nose or congestion that makes it difficult to breathe through your nose. Thick yellow or greenish drainage from your nose. Tenderness, swelling, and warmth over the affected sinuses. A cough that may get worse at night. Decreased sense of smell and taste. Extra mucus that collects in the throat or the back of the nose (postnasal drip) causing a sore throat or bad breath. Tiredness (fatigue). Fever. How is this diagnosed? This condition is diagnosed based on: Your symptoms. Your medical history. A physical exam. Tests to find out if your condition is acute or chronic. This may include: Checking your nose for nasal polyps. Viewing your sinuses using a device that has a light (endoscope). Testing for allergies or bacteria. Imaging tests, such as an MRI or CT scan. In rare cases, a bone biopsy may be done to rule out more serious types of fungal sinus disease. How is this treated? Treatment for a sinus infection depends on the cause and whether your condition is chronic or acute. If caused by a virus, your symptoms should go away on their own within 10 days. You may be given medicines to relieve symptoms. They include: Medicines that shrink swollen nasal passages (decongestants). A spray that eases inflammation of the nostrils (topical intranasal corticosteroids). Rinses that help get rid of thick mucus in your nose (nasal saline washes). Medicines that treat allergies (antihistamines). Over-the-counter pain relievers. If caused by bacteria, your health care provider may recommend waiting to see if your symptoms improve. Most bacterial infections will get better without antibiotic medicine. You may be given antibiotics if you have: A severe infection. A weak immune system. If caused by narrow nasal passages or nasal polyps, surgery may be needed. Follow these instructions at home: Medicines Take, use, or apply over-the-counter and prescription medicines only as  told by your health care provider. These may include nasal sprays. If you were prescribed an antibiotic medicine, take it as told by your health care provider. Do not stop taking the antibiotic even if you start to feel better. Hydrate and humidify  Drink enough fluid to keep your urine pale yellow. Staying hydrated will help to thin your mucus. Use a cool mist humidifier to keep the humidity level in your home above 50%. Inhale steam for 10-15 minutes, 3-4 times a day, or as told by your health care provider. You can do this in the bathroom while a hot shower is running. Limit your exposure to cool or dry air. Rest Rest as much as possible. Sleep with your head raised (elevated). Make sure you get enough sleep each night. General instructions  Apply a warm, moist washcloth to your face 3-4 times a day or as told by your health care provider. This will help with discomfort. Use nasal saline washes as often as told by your health care provider. Wash your hands often with soap and water to reduce your exposure to germs. If soap and water are not available, use hand sanitizer. Do  not smoke. Avoid being around people who are smoking (secondhand smoke). Keep all follow-up visits. This is important. Contact a health care provider if: You have a fever. Your symptoms get worse. Your symptoms do not improve within 10 days. Get help right away if: You have a severe headache. You have persistent vomiting. You have severe pain or swelling around your face or eyes. You have vision problems. You develop confusion. Your neck is stiff. You have trouble breathing. These symptoms may be an emergency. Get help right away. Call 911. Do not wait to see if the symptoms will go away. Do not drive yourself to the hospital. Summary A sinus infection is soreness and inflammation of your sinuses. Sinuses are hollow spaces in the bones around your face. This condition is caused by nasal tissues that become  inflamed or swollen. The swelling traps or blocks the flow of mucus. This allows bacteria, viruses, and fungi to grow, which leads to infection. If you were prescribed an antibiotic medicine, take it as told by your health care provider. Do not stop taking the antibiotic even if you start to feel better. Keep all follow-up visits. This is important. This information is not intended to replace advice given to you by your health care provider. Make sure you discuss any questions you have with your health care provider. Document Revised: 09/07/2021 Document Reviewed: 09/07/2021 Elsevier Patient Education  2024 Elsevier Inc.    If you have been instructed to have an in-person evaluation today at a local Urgent Care facility, please use the link below. It will take you to a list of all of our available [facility], including address, phone number and hours of operation. Please do not delay care.  [facility]  If you or a family member do not have a primary care provider, use the link below to schedule a visit and establish care. When you choose a [facility] primary care physician or advanced practice provider, you gain a long-term partner in health. Find a Primary Care Provider  Learn more about [facility]'s in-office and virtual care options: [facility] Get Care Now

## 2023-08-19 NOTE — Progress Notes (Signed)
Virtual Visit Consent   Jill Atkinson, you are scheduled for a virtual visit with a [facility] provider today. Just as with appointments in the office, your consent must be obtained to participate. Your consent will be active for this visit and any virtual visit you may have with one of our providers in the next 365 days. If you have a MyChart account, a copy of this consent can be sent to you electronically.  As this is a virtual visit, video technology does not allow for your provider to perform a traditional examination. This may limit your provider's ability to fully assess your condition. If your provider identifies any concerns that need to be evaluated in person or the need to arrange testing (such as labs, EKG, etc.), we will make arrangements to do so. Although advances in technology are sophisticated, we cannot ensure that it will always work on either your end or our end. If the connection with a video visit is poor, the visit may have to be switched to a telephone visit. With either a video or telephone visit, we are not always able to ensure that we have a secure connection.  By engaging in this virtual visit, you consent to the provision of healthcare and authorize for your insurance to be billed (if applicable) for the services provided during this visit. Depending on your insurance coverage, you may receive a charge related to this service.  I need to obtain your verbal consent now. Are you willing to proceed with your visit today? Jill Atkinson has provided verbal consent on 08/19/2023 for a virtual visit (video or telephone). Reed Pandy, [place]  Date: 08/19/2023 2:23 PM  Virtual Visit via Video Note   I, Reed Pandy, connected with  Jill Atkinson  ([id number], [birth year]) on 08/19/23 at  2:30 PM EDT by a video-enabled telemedicine application and verified that I am speaking with the correct person using two identifiers.  Location: Patient: Virtual Visit Location Patient:  Home Provider: Virtual Visit Location Provider: Home Office   I discussed the limitations of evaluation and management by telemedicine and the availability of in person appointments. The patient expressed understanding and agreed to proceed.    History of Present Illness: Jill Atkinson is a 57 y.o. who identifies as a female who was assigned female at birth, and is being seen today for c/o " I've been sick for about a week"  Pt states she started with a sore throat but now has a cough and pressure in her head and ear pain.  Pt states has a swollen gland on the left side of her neck.  Pt states she has pain with swallowing.  Pt denies fever, chills, body aches or N/V.  Pt states has taken dayquil and nightquil, steam, and lots of fluid and naps.  Pt denies sick contacts.   HPI: HPI  Problems:  Patient Active Problem List   Diagnosis Date Noted   Arthritis 07/04/2023   Left ankle pain 12/09/2020   Arrhythmia    Prediabetes 11/14/2018   Class 1 obesity with serious comorbidity and body mass index (BMI) of 30.0 to 30.9 in adult 11/01/2018   GERD (gastroesophageal reflux disease) 10/29/2018   Cellulitis of right lower extremity 04/05/2017   Sinus congestion 10/26/2016   Cough 08/08/2016   Depression 05/30/2016   Hypertension 08/11/2015   Screening for breast cancer 08/11/2015   Special screening for malignant neoplasms, colon 08/11/2015   Menopausal disorder 09/27/2013   Routine general medical  examination at a health care facility 08/06/2013   Exercise-induced asthma 08/02/2012   History of prior ablation treatment 08/02/2012    Allergies:  Allergies  Allergen Reactions   Bactrim [Sulfamethoxazole-Trimethoprim]     Lip swelling   Medications:  Current Outpatient Medications:    amoxicillin-clavulanate (AUGMENTIN) 875-125 MG tablet, Take 1 tablet by mouth 2 (two) times daily for 10 days., Disp: 20 tablet, Rfl: 0   fluticasone (FLONASE) 50 MCG/ACT nasal spray, Place 2 sprays into  both nostrils daily for 10 days., Disp: 16 g, Rfl: 0   albuterol (VENTOLIN HFA) 108 (90 Base) MCG/ACT inhaler, Inhale 2 puffs into the lungs every 6 (six) hours as needed for wheezing., Disp: 18 g, Rfl: 3   buPROPion (WELLBUTRIN XL) 300 MG 24 hr tablet, Take 1 tablet (300 mg total) by mouth daily., Disp: 90 tablet, Rfl: 3   Cholecalciferol (D3-1000) 25 MCG (1000 UT) tablet, , Disp: , Rfl:    escitalopram (LEXAPRO) 10 MG tablet, Take 1 tablet (10 mg total) by mouth daily., Disp: 90 tablet, Rfl: 3   famotidine (PEPCID) 10 MG tablet, Take 10 mg by mouth 2 (two) times daily., Disp: , Rfl:    Ferrous Sulfate (IRON) 28 MG TABS, , Disp: , Rfl:    Levocetirizine Dihydrochloride (XYZAL ALLERGY 24HR PO), Take by mouth daily., Disp: , Rfl:    lisinopril (ZESTRIL) 20 MG tablet, Take 1 tablet (20 mg total) by mouth daily., Disp: 90 tablet, Rfl: 3  Observations/Objective: Patient is well-developed, well-nourished in no acute distress.  Resting comfortably at home.  Head is normocephalic, atraumatic.  No labored breathing.  Speech is clear and coherent with logical content.  Patient is alert and oriented at baseline.    Assessment and Plan: 1. Acute sinusitis, recurrence not specified, unspecified location - amoxicillin-clavulanate (AUGMENTIN) 875-125 MG tablet; Take 1 tablet by mouth 2 (two) times daily for 10 days.  Dispense: 20 tablet; Refill: 0 - fluticasone (FLONASE) 50 MCG/ACT nasal spray; Place 2 sprays into both nostrils daily for 10 days.  Dispense: 16 g; Refill: 0  -Will treat for a sinus infection -Pt to follow up with PCP or urgent care if worsening symptoms.   Follow Up Instructions: I discussed the assessment and treatment plan with the patient. The patient was provided an opportunity to ask questions and all were answered. The patient agreed with the plan and demonstrated an understanding of the instructions.  A copy of instructions were sent to the patient via MyChart unless otherwise  noted below.    The patient was advised to call back or seek an in-person evaluation if the symptoms worsen or if the condition fails to improve as anticipated.    Reed Pandy, PA-C

## 2023-09-01 ENCOUNTER — Other Ambulatory Visit (INDEPENDENT_AMBULATORY_CARE_PROVIDER_SITE_OTHER): Payer: 59

## 2023-09-01 DIAGNOSIS — D582 Other hemoglobinopathies: Secondary | ICD-10-CM | POA: Diagnosis not present

## 2023-09-04 LAB — ERYTHROPOIETIN: Erythropoietin: 6.3 m[IU]/mL (ref 2.6–18.5)

## 2023-09-04 LAB — CARBOXYHEMOGLOBIN: Carboxyhemoglobin: 3 %TOTAL HGB

## 2023-09-08 LAB — SPECIMEN STATUS REPORT

## 2023-09-08 LAB — JAK2 V617F, REFLEX TO E12-15

## 2023-09-08 LAB — E12-E15

## 2023-09-12 ENCOUNTER — Telehealth: Payer: Self-pay

## 2023-09-12 ENCOUNTER — Other Ambulatory Visit: Payer: Self-pay

## 2023-09-12 ENCOUNTER — Encounter: Payer: Self-pay | Admitting: Family

## 2023-09-12 DIAGNOSIS — D582 Other hemoglobinopathies: Secondary | ICD-10-CM

## 2023-09-12 DIAGNOSIS — D751 Secondary polycythemia: Secondary | ICD-10-CM | POA: Insufficient documentation

## 2023-09-12 NOTE — Telephone Encounter (Signed)
-----   Message from Rennie Plowman sent at 09/12/2023  8:25 AM EST ----- Schedule serum lab.  Order CBC with differential and schedule in 4 to 6 weeks Please let me know if okay for pulmonology referral for evaluation of sleep apnea

## 2023-09-12 NOTE — Telephone Encounter (Signed)
Lvm for pt to give office a call back in regards to lab results;    Schedule serum lab.  Order CBC with differential and schedule in 4 to 6 weeks Please let me know if okay for pulmonology referral for evaluation of sleep apnea   "Gwen,   Labs are stable and do not reflect any underlying etiology for elevation in hemoglobin.  Sleep apnea can be associated with elevation in hemoglobin.  Would you like for me to pursue a sleep apnea test?  This is done through our colleagues at pulmonology.    I would like to continue to monitor hemoglobin.  Please call the office to schedule a nonfasting lab in the next 4 to 6 weeks.  If hemoglobin were to continue to elevate, at that point I would recommend a consult with hematology     Regards, Claris Che"

## 2023-09-19 ENCOUNTER — Encounter: Payer: Self-pay | Admitting: Family

## 2023-09-21 ENCOUNTER — Other Ambulatory Visit: Payer: Self-pay | Admitting: Family

## 2023-09-21 DIAGNOSIS — D582 Other hemoglobinopathies: Secondary | ICD-10-CM

## 2023-10-17 ENCOUNTER — Other Ambulatory Visit (INDEPENDENT_AMBULATORY_CARE_PROVIDER_SITE_OTHER): Payer: 59

## 2023-10-17 DIAGNOSIS — D582 Other hemoglobinopathies: Secondary | ICD-10-CM | POA: Diagnosis not present

## 2023-10-17 LAB — CBC WITH DIFFERENTIAL/PLATELET
Basophils Absolute: 0 K/uL (ref 0.0–0.1)
Basophils Relative: 0.3 % (ref 0.0–3.0)
Eosinophils Absolute: 0.1 K/uL (ref 0.0–0.7)
Eosinophils Relative: 1.9 % (ref 0.0–5.0)
HCT: 42.2 % (ref 36.0–46.0)
Hemoglobin: 14.1 g/dL (ref 12.0–15.0)
Lymphocytes Relative: 34.1 % (ref 12.0–46.0)
Lymphs Abs: 1.5 K/uL (ref 0.7–4.0)
MCHC: 33.5 g/dL (ref 30.0–36.0)
MCV: 97.1 fl (ref 78.0–100.0)
Monocytes Absolute: 0.5 K/uL (ref 0.1–1.0)
Monocytes Relative: 12.1 % — ABNORMAL HIGH (ref 3.0–12.0)
Neutro Abs: 2.3 K/uL (ref 1.4–7.7)
Neutrophils Relative %: 51.6 % (ref 43.0–77.0)
Platelets: 327 K/uL (ref 150.0–400.0)
RBC: 4.34 Mil/uL (ref 3.87–5.11)
RDW: 13.3 % (ref 11.5–15.5)
WBC: 4.5 K/uL (ref 4.0–10.5)

## 2023-10-19 ENCOUNTER — Telehealth: Payer: Self-pay

## 2023-10-19 NOTE — Telephone Encounter (Signed)
 Notes sent to Dr Lorin Picket

## 2023-12-25 ENCOUNTER — Other Ambulatory Visit (INDEPENDENT_AMBULATORY_CARE_PROVIDER_SITE_OTHER): Payer: 59

## 2023-12-25 DIAGNOSIS — D582 Other hemoglobinopathies: Secondary | ICD-10-CM

## 2023-12-25 LAB — CBC WITH DIFFERENTIAL/PLATELET
Basophils Absolute: 0 10*3/uL (ref 0.0–0.1)
Basophils Relative: 0.3 % (ref 0.0–3.0)
Eosinophils Absolute: 0.1 10*3/uL (ref 0.0–0.7)
Eosinophils Relative: 3 % (ref 0.0–5.0)
HCT: 46.1 % — ABNORMAL HIGH (ref 36.0–46.0)
Hemoglobin: 15.6 g/dL — ABNORMAL HIGH (ref 12.0–15.0)
Lymphocytes Relative: 26.6 % (ref 12.0–46.0)
Lymphs Abs: 1.2 10*3/uL (ref 0.7–4.0)
MCHC: 33.7 g/dL (ref 30.0–36.0)
MCV: 96.6 fl (ref 78.0–100.0)
Monocytes Absolute: 0.5 10*3/uL (ref 0.1–1.0)
Monocytes Relative: 9.9 % (ref 3.0–12.0)
Neutro Abs: 2.8 10*3/uL (ref 1.4–7.7)
Neutrophils Relative %: 60.2 % (ref 43.0–77.0)
Platelets: 294 10*3/uL (ref 150.0–400.0)
RBC: 4.78 Mil/uL (ref 3.87–5.11)
RDW: 12.6 % (ref 11.5–15.5)
WBC: 4.6 10*3/uL (ref 4.0–10.5)

## 2023-12-27 ENCOUNTER — Encounter: Payer: Self-pay | Admitting: Family

## 2024-02-01 ENCOUNTER — Telehealth: Admitting: Family Medicine

## 2024-02-01 DIAGNOSIS — L0100 Impetigo, unspecified: Secondary | ICD-10-CM | POA: Diagnosis not present

## 2024-02-01 MED ORDER — MUPIROCIN 2 % EX OINT: 1.0000 | TOPICAL_OINTMENT | Freq: Two times a day (BID) | CUTANEOUS | 0 refills | Status: DC

## 2024-02-01 NOTE — Progress Notes (Signed)
 E Visit for Impetigo  We are sorry that you are not feeling well. Here is how we plan to help!  I have ordered you an ointment to apply to the nasal area to help with this staph skin infection of the nose.  Topical mupiricin   HOME CARE:  Take cool showers and avoid direct sunlight. Apply cool compress or wet dressings.   GET HELP RIGHT AWAY IF:  Symptoms don't go away after treatment. Severe itching that persists. If you rash spreads or swells. If you rash begins to smell. If it blisters and opens or develops a yellow-brown crust. You develop a fever. You have a sore throat. You become short of breath.  MAKE SURE YOU:  Understand these instructions. Will watch your condition. Will get help right away if you are not doing well or get worse.  Thank you for choosing an e-visit.  Your e-visit answers were reviewed by a board certified advanced clinical practitioner to complete your personal care plan. Depending upon the condition, your plan could have included both over the counter or prescription medications.  Please review your pharmacy choice. Make sure the pharmacy is open so you can pick up prescription now. If there is a problem, you may contact your provider through [employer] and have the prescription routed to another pharmacy.  Your safety is important to us . If you have drug allergies check your prescription carefully.   For the next 24 hours you can use MyChart to ask questions about today's visit, request a non-urgent call back, or ask for a work or school excuse. You will get an email in the next two days asking about your experience. I hope that your e-visit has been valuable and will speed your recovery.  I provided 5 minutes of non face-to-face time during this encounter for chart review, medication and order placement, as well as and documentation.

## 2024-05-30 ENCOUNTER — Telehealth

## 2024-05-30 ENCOUNTER — Telehealth: Admitting: Physician Assistant

## 2024-05-30 DIAGNOSIS — B029 Zoster without complications: Secondary | ICD-10-CM

## 2024-05-30 MED ORDER — VALACYCLOVIR HCL 1 G PO TABS: 1000.0000 mg | ORAL_TABLET | Freq: Three times a day (TID) | ORAL | 0 refills | Status: AC

## 2024-05-30 NOTE — Progress Notes (Signed)
 Virtual Visit Consent   Jill Atkinson, you are scheduled for a virtual visit with a [facility] provider today. Just as with appointments in the office, your consent must be obtained to participate. Your consent will be active for this visit and any virtual visit you may have with one of our providers in the next 365 days. If you have a MyChart account, a copy of this consent can be sent to you electronically.  As this is a virtual visit, video technology does not allow for your provider to perform a traditional examination. This may limit your provider's ability to fully assess your condition. If your provider identifies any concerns that need to be evaluated in person or the need to arrange testing (such as labs, EKG, etc.), we will make arrangements to do so. Although advances in technology are sophisticated, we cannot ensure that it will always work on either your end or our end. If the connection with a video visit is poor, the visit may have to be switched to a telephone visit. With either a video or telephone visit, we are not always able to ensure that we have a secure connection.  By engaging in this virtual visit, you consent to the provision of healthcare and authorize for your insurance to be billed (if applicable) for the services provided during this visit. Depending on your insurance coverage, you may receive a charge related to this service.  I need to obtain your verbal consent now. Are you willing to proceed with your visit today? Jill Atkinson has provided verbal consent on 05/30/2024 for a virtual visit (video or telephone). Jill Atkinson, [place]  Date: 05/30/2024 9:58 AM   Virtual Visit via Video Note   I, Jill Atkinson, connected with  ECHO PROPP  ([id number], 1966-06-21) on 05/30/24 at  9:45 AM EDT by a video-enabled telemedicine application and verified that I am speaking with the correct person using two identifiers.  Location: Patient: Virtual Visit  Location Patient: Home Provider: Virtual Visit Location Provider: Home Office   I discussed the limitations of evaluation and management by telemedicine and the availability of in person appointments. The patient expressed understanding and agreed to proceed.    History of Present Illness: Jill Atkinson is a 58 y.o. who identifies as a female who was assigned female at birth, and is being seen today for rash of face first noted yesterday. Notes last night seeing a bumpy rash of her forehead, mostly concentrated on right side. Was mildly itchy. Notes that she gets dermatitis often so applied an OTC steroid cream without change. Went to sleep. This morning noting lesions of the right cheek area that are itchy and blistery. Has history of shingles and concerned for the same. Denies recent travel or sick contact. Denies change in soaps, lotions, detergents or makeup.  HPI: HPI  Problems:  Patient Active Problem List   Diagnosis Date Noted   Erythrocytosis 09/12/2023   Arthritis 07/04/2023   Left ankle pain 12/09/2020   Arrhythmia    Prediabetes 11/14/2018   Class 1 obesity with serious comorbidity and body mass index (BMI) of 30.0 to 30.9 in adult 11/01/2018   GERD (gastroesophageal reflux disease) 10/29/2018   Cellulitis of right lower extremity 04/05/2017   Sinus congestion 10/26/2016   Cough 08/08/2016   Depression 05/30/2016   Hypertension 08/11/2015   Screening for breast cancer 08/11/2015   Special screening for malignant neoplasms, colon 08/11/2015   Menopausal disorder 09/27/2013   Routine  general medical examination at a health care facility 08/06/2013   Exercise-induced asthma 08/02/2012   History of prior ablation treatment 08/02/2012    Allergies:  Allergies  Allergen Reactions   Bactrim  [Sulfamethoxazole -Trimethoprim ]     Lip swelling   Medications:  Current Outpatient Medications:    valACYclovir  (VALTREX ) 1000 MG tablet, Take 1 tablet (1,000 mg total) by mouth 3  (three) times daily for 7 days., Disp: 21 tablet, Rfl: 0   albuterol  (VENTOLIN  HFA) 108 (90 Base) MCG/ACT inhaler, Inhale 2 puffs into the lungs every 6 (six) hours as needed for wheezing., Disp: 18 g, Rfl: 3   buPROPion  (WELLBUTRIN  XL) 300 MG 24 hr tablet, Take 1 tablet (300 mg total) by mouth daily., Disp: 90 tablet, Rfl: 3   Cholecalciferol (D3-1000) 25 MCG (1000 UT) tablet, , Disp: , Rfl:    escitalopram  (LEXAPRO ) 10 MG tablet, Take 1 tablet (10 mg total) by mouth daily., Disp: 90 tablet, Rfl: 3   famotidine (PEPCID) 10 MG tablet, Take 10 mg by mouth 2 (two) times daily., Disp: , Rfl:    Ferrous Sulfate (IRON) 28 MG TABS, , Disp: , Rfl:    fluticasone  (FLONASE ) 50 MCG/ACT nasal spray, Place 2 sprays into both nostrils daily for 10 days., Disp: 16 g, Rfl: 0   Levocetirizine Dihydrochloride (XYZAL ALLERGY 24HR PO), Take by mouth daily., Disp: , Rfl:    lisinopril  (ZESTRIL ) 20 MG tablet, Take 1 tablet (20 mg total) by mouth daily., Disp: 90 tablet, Rfl: 3  Observations/Objective: Patient is well-developed, well-nourished in no acute distress.  Resting comfortably  at home.  Head is normocephalic, atraumatic.  No labored breathing.  Speech is clear and coherent with logical content.  Patient is alert and oriented at baseline.  Papulovesicular lesions notes of forehead, mainly R-sided with lesion just adjacent to midline on L side of forehead. Similar lesions noted of R cheek. No other lesions noted elsewhere.  Assessment and Plan: 1. Herpes zoster without complication (Primary) - valACYclovir  (VALTREX ) 1000 MG tablet; Take 1 tablet (1,000 mg total) by mouth 3 (three) times daily for 7 days.  Dispense: 21 tablet; Refill: 0  Supportive measures and OTC medications reviewed. Valtrex  per orders. Keep area covered if having to be near others until the lesions crust over. In-person follow-up precautions reviewed with patient.   Follow Up Instructions: I discussed the assessment and treatment  plan with the patient. The patient was provided an opportunity to ask questions and all were answered. The patient agreed with the plan and demonstrated an understanding of the instructions.  A copy of instructions were sent to the patient via MyChart unless otherwise noted below.   The patient was advised to call back or seek an in-person evaluation if the symptoms worsen or if the condition fails to improve as anticipated.    Jill Velma Lunger, PA-C

## 2024-05-30 NOTE — Patient Instructions (Signed)
 Jill Atkinson, thank you for joining Jill Velma Lunger, PA-C for today's virtual visit.  While this provider is not your primary care provider (PCP), if your PCP is located in our provider database this encounter information will be shared with them immediately following your visit.   A Jill Atkinson account gives you access to today's visit and all your visits, tests, and labs performed at [facility]  click here if you don't have a [facility] account or go to [email or web address]  Consent: (Patient) Jill Atkinson provided verbal consent for this virtual visit at the beginning of the encounter.  Current Medications:  Current Outpatient Medications:    albuterol  (VENTOLIN  HFA) 108 (90 Base) MCG/ACT inhaler, Inhale 2 puffs into the lungs every 6 (six) hours as needed for wheezing., Disp: 18 g, Rfl: 3   buPROPion  (WELLBUTRIN  XL) 300 MG 24 hr tablet, Take 1 tablet (300 mg total) by mouth daily., Disp: 90 tablet, Rfl: 3   Cholecalciferol (D3-1000) 25 MCG (1000 UT) tablet, , Disp: , Rfl:    escitalopram  (LEXAPRO ) 10 MG tablet, Take 1 tablet (10 mg total) by mouth daily., Disp: 90 tablet, Rfl: 3   famotidine (PEPCID) 10 MG tablet, Take 10 mg by mouth 2 (two) times daily., Disp: , Rfl:    Ferrous Sulfate (IRON) 28 MG TABS, , Disp: , Rfl:    fluticasone  (FLONASE ) 50 MCG/ACT nasal spray, Place 2 sprays into both nostrils daily for 10 days., Disp: 16 g, Rfl: 0   Levocetirizine Dihydrochloride (XYZAL ALLERGY 24HR PO), Take by mouth daily., Disp: , Rfl:    lisinopril  (ZESTRIL ) 20 MG tablet, Take 1 tablet (20 mg total) by mouth daily., Disp: 90 tablet, Rfl: 3   mupirocin  ointment (BACTROBAN ) 2 %, Apply 1 Application topically 2 (two) times daily., Disp: 22 g, Rfl: 0   Medications ordered in this encounter:  No orders of the defined types were placed in this encounter.    *If you need refills on other medications prior to your next appointment, please contact  your pharmacy*  Follow-Up: Call back or seek an in-person evaluation if the symptoms worsen or if the condition fails to improve as anticipated.  [facility] [phone]  Other Instruction  Based on what you shared with me it looks like you have shingles.  Shingles or herpes zoster, is a common infection of the nerves.  It is a painful rash caused by the herpes zoster virus.  This is the same virus that causes chickenpox.  After a person has chickenpox, the virus remains inactive in the nerve cells.  Years later, the virus can become active again and travel to the skin.  It typically will appear on one side of the face or body.  Burning or shooting pain, tingling, or itching are early signs of the infection.  Blisters typically scab over in 7 to 10 days and clear up within 2-4 weeks. Shingles is only contagious to people that have never had the chickenpox, the chickenpox vaccine, or anyone who has a compromised immune system.  You should avoid contact with these type of people until your blisters scab over.  I have prescribed Valacyclovir  1g three times daily for 7 days.  HOME CARE: Apply ice packs (wrapped in a thin towel), cool compresses, or soak in cool bath to help reduce pain. Use calamine lotion to calm itchy skin. Avoid scratching the rash. Avoid direct sunlight.  GET HELP RIGHT AWAY IF: Symptoms that don't away  after treatment. A rash or blisters near your eye. Increased drainage, fever, or rash after treatment. Severe pain that doesn't go away.   MAKE SURE YOU   Understand these instructions. Will watch your condition. Will get help right away if you are not doing well or get worse.      If you have been instructed to have an in-person evaluation today at a local Urgent Care facility, please use the link below. It will take you to a list of all of our available [facility], including address, phone number and hours of operation. Please do  not delay care.  [facility]  If you or a family member do not have a primary care provider, use the link below to schedule a visit and establish care. When you choose a [facility] primary care physician or advanced practice provider, you gain a long-term partner in health. Find a Primary Care Provider  Learn more about [facility] in-office and virtual care options:  - Get Care Now

## 2024-06-06 NOTE — Addendum Note (Signed)
 Addended by: GLADIS ELSIE BROCKS on: 06/06/2024 07:25 AM   Modules accepted: Level of Service

## 2024-06-27 ENCOUNTER — Other Ambulatory Visit: Payer: Self-pay | Admitting: Family

## 2024-06-27 DIAGNOSIS — I10 Essential (primary) hypertension: Secondary | ICD-10-CM

## 2024-06-27 DIAGNOSIS — Z0000 Encounter for general adult medical examination without abnormal findings: Secondary | ICD-10-CM

## 2024-07-04 ENCOUNTER — Other Ambulatory Visit: Payer: Self-pay

## 2024-07-04 ENCOUNTER — Ambulatory Visit: Payer: 59 | Admitting: Family

## 2024-07-04 ENCOUNTER — Ambulatory Visit: Payer: Self-pay

## 2024-07-04 ENCOUNTER — Encounter: Payer: Self-pay | Admitting: Family

## 2024-07-04 VITALS — BP 124/76 | HR 79 | Temp 97.7°F | Ht 66.0 in | Wt 182.4 lb

## 2024-07-04 DIAGNOSIS — Z23 Encounter for immunization: Secondary | ICD-10-CM

## 2024-07-04 DIAGNOSIS — Z0000 Encounter for general adult medical examination without abnormal findings: Secondary | ICD-10-CM

## 2024-07-04 DIAGNOSIS — F32A Depression, unspecified: Secondary | ICD-10-CM | POA: Diagnosis not present

## 2024-07-04 DIAGNOSIS — I10 Essential (primary) hypertension: Secondary | ICD-10-CM

## 2024-07-04 DIAGNOSIS — Z1231 Encounter for screening mammogram for malignant neoplasm of breast: Secondary | ICD-10-CM

## 2024-07-04 LAB — COMPREHENSIVE METABOLIC PANEL WITH GFR
ALT: 30 U/L (ref 0–35)
AST: 23 U/L (ref 0–37)
Albumin: 4.6 g/dL (ref 3.5–5.2)
Alkaline Phosphatase: 77 U/L (ref 39–117)
BUN: 8 mg/dL (ref 6–23)
CO2: 28 meq/L (ref 19–32)
Calcium: 9.8 mg/dL (ref 8.4–10.5)
Chloride: 103 meq/L (ref 96–112)
Creatinine, Ser: 0.76 mg/dL (ref 0.40–1.20)
GFR: 85.4 mL/min (ref >60.00)
Glucose, Bld: 90 mg/dL (ref 70–99)
Potassium: 4.7 meq/L (ref 3.5–5.1)
Sodium: 140 meq/L (ref 135–145)
Total Bilirubin: 0.4 mg/dL (ref 0.2–1.2)
Total Protein: 6.9 g/dL (ref 6.0–8.3)

## 2024-07-04 LAB — LIPID PANEL
Cholesterol: 229 mg/dL — ABNORMAL HIGH (ref 0–200)
HDL: 60.1 mg/dL (ref >39.00)
LDL Cholesterol: 136 mg/dL — ABNORMAL HIGH (ref 0–99)
NonHDL: 169.12
Total CHOL/HDL Ratio: 4
Triglycerides: 168 mg/dL — ABNORMAL HIGH (ref 0.0–149.0)
VLDL: 33.6 mg/dL (ref 0.0–40.0)

## 2024-07-04 LAB — CBC WITH DIFFERENTIAL/PLATELET
Basophils Absolute: 0 K/uL (ref 0.0–0.1)
Basophils Relative: 0.4 % (ref 0.0–3.0)
Eosinophils Absolute: 0.1 K/uL (ref 0.0–0.7)
Eosinophils Relative: 1.6 % (ref 0.0–5.0)
HCT: 43.8 % (ref 36.0–46.0)
Hemoglobin: 14.7 g/dL (ref 12.0–15.0)
Lymphocytes Relative: 27.2 % (ref 12.0–46.0)
Lymphs Abs: 1.1 K/uL (ref 0.7–4.0)
MCHC: 33.6 g/dL (ref 30.0–36.0)
MCV: 95.5 fl (ref 78.0–100.0)
Monocytes Absolute: 0.4 K/uL (ref 0.1–1.0)
Monocytes Relative: 9.9 % (ref 3.0–12.0)
Neutro Abs: 2.5 K/uL (ref 1.4–7.7)
Neutrophils Relative %: 60.9 % (ref 43.0–77.0)
Platelets: 308 K/uL (ref 150.0–400.0)
RBC: 4.59 Mil/uL (ref 3.87–5.11)
RDW: 14.6 % (ref 11.5–15.5)
WBC: 4.1 K/uL (ref 4.0–10.5)

## 2024-07-04 LAB — VITAMIN D 25 HYDROXY (VIT D DEFICIENCY, FRACTURES): VITD: 47.5 ng/mL (ref 30.00–100.00)

## 2024-07-04 LAB — TSH: TSH: 1.15 u[IU]/mL (ref 0.35–5.50)

## 2024-07-04 LAB — HEMOGLOBIN A1C: Hgb A1c MFr Bld: 5.7 % (ref 4.6–6.5)

## 2024-07-04 MED ORDER — ESCITALOPRAM OXALATE 10 MG PO TABS: 10.0000 mg | ORAL_TABLET | Freq: Every day | ORAL | 3 refills | Status: AC

## 2024-07-04 MED ORDER — COVID-19 MRNA VAC-TRIS(PFIZER) 30 MCG/0.3ML IM SUSY: 0.3000 mL | PREFILLED_SYRINGE | Freq: Once | INTRAMUSCULAR | 0 refills | Status: AC

## 2024-07-04 MED ORDER — BUPROPION HCL ER (XL) 300 MG PO TB24: 300.0000 mg | ORAL_TABLET | Freq: Every day | ORAL | 3 refills | Status: AC

## 2024-07-04 MED ORDER — LISINOPRIL 20 MG PO TABS: 20.0000 mg | ORAL_TABLET | Freq: Every day | ORAL | 3 refills | Status: AC

## 2024-07-04 NOTE — Progress Notes (Signed)
 Assessment & Plan:  Routine general medical examination at a health care facility Assessment & Plan: CBE performed. Pap smear UTD. She will schedule mammogram. RX for COVID vaccine.  Encouraged exercise program.   Orders: -     VITAMIN D  25 Hydroxy (Vit-D Deficiency, Fractures) -     Hemoglobin A1c -     CBC with Differential/Platelet -     Comprehensive metabolic panel with GFR -     Lipid panel -     TSH -     Measles/Mumps/Rubella Immunity -     buPROPion  HCl ER (XL); Take 1 tablet (300 mg total) by mouth daily.  Dispense: 90 tablet; Refill: 3 -     Lisinopril ; Take 1 tablet (20 mg total) by mouth daily.  Dispense: 90 tablet; Refill: 3 -     Iron, TIBC and Ferritin Panel  Need for influenza vaccination -     Flu vaccine trivalent PF, 6mos and older(Flulaval,Afluria,Fluarix,Fluzone)  Encounter for screening mammogram for malignant neoplasm of breast -     3D Screening Mammogram, Left and Right; Future  Depression, unspecified depression type -     buPROPion  HCl ER (XL); Take 1 tablet (300 mg total) by mouth daily.  Dispense: 90 tablet; Refill: 3  Essential hypertension -     Lisinopril ; Take 1 tablet (20 mg total) by mouth daily.  Dispense: 90 tablet; Refill: 3  Primary hypertension Assessment & Plan: Chronic stable.  Continue lisinopril  20 mg qd   Other orders -     COVID-19 mRNA Vac-TriS(Pfizer); Inject 0.3 mLs into the muscle once for 1 dose.  Dispense: 0.3 mL; Refill: 0     Return precautions given.   Risks, benefits, and alternatives of the medications and treatment plan prescribed today were discussed, and patient expressed understanding.   Education regarding symptom management and diagnosis given to patient on AVS either electronically or printed.  Return for Complete Physical Exam.  Rollene Northern, FNP  Subjective:    Patient ID: Jill Atkinson, female    DOB: Nov 03, 1965, 58 y.o.   MRN: [id number]  CC: Jill Atkinson is a 58 y.o. female who  presents today for physical exam.    HPI: HPI  Discussed the use of AI scribe software for clinical note transcription with the patient, who gave verbal consent to proceed.  History of Present Illness   Jill Atkinson is a 58 year old female who presents for an annual physical exam.  She is inquiring about her vaccination status, specifically regarding the need for a measles shot. Her vaccination record includes a live vaccine in 1966, a measles booster in 1973, and an MMR in 1977. Additionally, she is interested in receiving a COVID booster, noting her asthma diagnosis, and plans to get the booster at a local pharmacy.  Her exercise routine has been inconsistent due to previous ankle problems. She had been working up to running but has found it difficult to resume. She has attempted weight training but has not maintained it consistently. She owns a rowing machine but does not use it regularly.  Her current medications include Lexapro  and Wellbutrin , both of which are due for refills. She also takes Xyzal over the counter, iron supplements due to her status as a blood donor, and Pepcid, which she uses more frequently in the winter months. She takes vitamin D  supplements, as she has a history of low levels, with her lowest recorded at 14.  No chest pain or shortness of  breath.     Follows biannually with dermatology for annual skin check.  Colorectal Cancer Screening: UTD , 04/2021, Dr Maryruth, repeat in 10 years  Breast Cancer Screening: Mammogram due Cervical Cancer Screening: UTD, 07/04/23 NILM, neg HPV Bone Health screening/DEXA for 65+: No increased fracture risk. Defer screening at this time.  Lung Cancer Screening: Doesn't have 20 year pack year history and age > 43 years yo 37 years       Tetanus - UTD        Exercise: No regular exercise.   Alcohol use:  very occassional Smoking/tobacco use: Nonsmoker.    Health Maintenance  Topic Date Due   Breast Cancer Screening   05/08/2025   Pap with HPV screening  07/03/2028   Colon Cancer Screening  05/14/2031   DTaP/Tdap/Td vaccine (3 - Td or Tdap) 10/20/2032   Pneumococcal Vaccine for age over 1  Completed   Flu Shot  Completed   COVID-19 Vaccine  Completed   Hepatitis C Screening  Completed   HIV Screening  Completed   Zoster (Shingles) Vaccine  Completed   Hepatitis B Vaccine  Aged Out   HPV Vaccine  Aged Out   Meningitis B Vaccine  Aged Out    ALLERGIES: Bactrim  [sulfamethoxazole -trimethoprim ]  Current Outpatient Medications on File Prior to Visit  Medication Sig Dispense Refill   albuterol  (VENTOLIN  HFA) 108 (90 Base) MCG/ACT inhaler Inhale 2 puffs into the lungs every 6 (six) hours as needed for wheezing. 18 g 3   Cholecalciferol (D3-1000) 25 MCG (1000 UT) tablet      famotidine (PEPCID) 10 MG tablet Take 10 mg by mouth 2 (two) times daily.     Ferrous Sulfate (IRON) 28 MG TABS      fluticasone  (FLONASE ) 50 MCG/ACT nasal spray Place 2 sprays into both nostrils daily for 10 days. 16 g 0   Levocetirizine Dihydrochloride (XYZAL ALLERGY 24HR PO) Take by mouth daily.     No current facility-administered medications on file prior to visit.    Review of Systems  Constitutional:  Negative for chills and fever.  Respiratory:  Negative for cough.   Cardiovascular:  Negative for chest pain and palpitations.  Gastrointestinal:  Negative for nausea and vomiting.      Objective:    BP 124/76   Pulse 79   Temp 97.7 F (36.5 C) (Oral)   Ht 5' 6 (1.676 m)   Wt 182 lb 6.4 oz (82.7 kg)   LMP 05/26/2016   SpO2 98%   BMI 29.44 kg/m   BP Readings from Last 3 Encounters:  07/04/24 124/76  07/04/23 126/70  03/17/23 (!) 149/87   Wt Readings from Last 3 Encounters:  07/04/24 182 lb 6.4 oz (82.7 kg)  07/04/23 183 lb 9.6 oz (83.3 kg)  03/17/23 175 lb (79.4 kg)    Physical Exam Vitals reviewed.  Constitutional:      Appearance: Normal appearance. She is well-developed.  Eyes:      Conjunctiva/sclera: Conjunctivae normal.  Neck:     Thyroid : No thyroid  mass or thyromegaly.  Cardiovascular:     Rate and Rhythm: Normal rate and regular rhythm.     Pulses: Normal pulses.     Heart sounds: Normal heart sounds.  Pulmonary:     Effort: Pulmonary effort is normal.     Breath sounds: Normal breath sounds. No wheezing, rhonchi or rales.  Chest:  Breasts:    Breasts are symmetrical.     Right: No inverted nipple, mass, nipple discharge,  skin change or tenderness.     Left: No inverted nipple, mass, nipple discharge, skin change or tenderness.  Abdominal:     General: Bowel sounds are normal. There is no distension.     Palpations: Abdomen is soft. Abdomen is not rigid. There is no fluid wave or mass.     Tenderness: There is no abdominal tenderness. There is no guarding or rebound.  Lymphadenopathy:     Head:     Right side of head: No submental, submandibular, tonsillar, preauricular, posterior auricular or occipital adenopathy.     Left side of head: No submental, submandibular, tonsillar, preauricular, posterior auricular or occipital adenopathy.     Cervical: No cervical adenopathy.     Right cervical: No superficial, deep or posterior cervical adenopathy.    Left cervical: No superficial, deep or posterior cervical adenopathy.  Skin:    General: Skin is warm and dry.  Neurological:     Mental Status: She is alert.  Psychiatric:        Speech: Speech normal.        Behavior: Behavior normal.        Thought Content: Thought content normal.

## 2024-07-04 NOTE — Progress Notes (Signed)
 SENT PT MY CHART MESSAGE AND LVM ALSO

## 2024-07-04 NOTE — Assessment & Plan Note (Signed)
Chronic stable.  Continue lisinopril 20 mg qd

## 2024-07-04 NOTE — Patient Instructions (Signed)
 Health Maintenance for Postmenopausal Women Menopause is a normal process in which your ability to get pregnant comes to an end. This process happens slowly over many months or years, usually between the ages of 60 and 38. Menopause is complete when you have missed your menstrual period for 12 months. It is important to talk with your health care provider about some of the most common conditions that affect women after menopause (postmenopausal women). These include heart disease, cancer, and bone loss (osteoporosis). Adopting a healthy lifestyle and getting preventive care can help to promote your health and wellness. The actions you take can also lower your chances of developing some of these common conditions. What are the signs and symptoms of menopause? During menopause, you may have the following symptoms: Hot flashes. These can be moderate or severe. Night sweats. Decrease in sex drive. Mood swings. Headaches. Tiredness (fatigue). Irritability. Memory problems. Problems falling asleep or staying asleep. Talk with your health care provider about treatment options for your symptoms. Do I need hormone replacement therapy? Hormone replacement therapy is effective in treating symptoms that are caused by menopause, such as hot flashes and night sweats. Hormone replacement carries certain risks, especially as you become older. If you are thinking about using estrogen or estrogen with progestin, discuss the benefits and risks with your health care provider. How can I reduce my risk for heart disease and stroke? The risk of heart disease, heart attack, and stroke increases as you age. One of the causes may be a change in the body's hormones during menopause. This can affect how your body uses dietary fats, triglycerides, and cholesterol. Heart attack and stroke are medical emergencies. There are many things that you can do to help prevent heart disease and stroke. Watch your blood pressure High  blood pressure causes heart disease and increases the risk of stroke. This is more likely to develop in people who have high blood pressure readings or are overweight. Have your blood pressure checked: Every 3-5 years if you are 60-23 years of age. Every year if you are 60 years old or older. Eat a healthy diet  Eat a diet that includes plenty of vegetables, fruits, low-fat dairy products, and lean protein. Do not eat a lot of foods that are high in solid fats, added sugars, or sodium. Get regular exercise Get regular exercise. This is one of the most important things you can do for your health. Most adults should: Try to exercise for at least 150 minutes each week. The exercise should increase your heart rate and make you sweat (moderate-intensity exercise). Try to do strengthening exercises at least twice each week. Do these in addition to the moderate-intensity exercise. Spend less time sitting. Even light physical activity can be beneficial. Other tips Work with your health care provider to achieve or maintain a healthy weight. Do not use any products that contain nicotine or tobacco. These products include cigarettes, chewing tobacco, and vaping devices, such as e-cigarettes. If you need help quitting, ask your health care provider. Know your numbers. Ask your health care provider to check your cholesterol and your blood sugar (glucose). Continue to have your blood tested as directed by your health care provider. Do I need screening for cancer? Depending on your health history and family history, you may need to have cancer screenings at different stages of your life. This may include screening for: Breast cancer. Cervical cancer. Lung cancer. Colorectal cancer. What is my risk for osteoporosis? After menopause, you may be  at increased risk for osteoporosis. Osteoporosis is a condition in which bone destruction happens more quickly than new bone creation. To help prevent osteoporosis or  the bone fractures that can happen because of osteoporosis, you may take the following actions: If you are 60-54 years old, get at least 1,000 mg of calcium and at least 600 international units (IU) of vitamin D  per day. If you are older than age 60 but younger than age 30, get at least 1,200 mg of calcium and at least 600 international units (IU) of vitamin D  per day. If you are older than age 60, get at least 1,200 mg of calcium and at least 800 international units (IU) of vitamin D  per day. Smoking and drinking excessive alcohol increase the risk of osteoporosis. Eat foods that are rich in calcium and vitamin D , and do weight-bearing exercises several times each week as directed by your health care provider. How does menopause affect my mental health? Depression may occur at any age, but it is more common as you become older. Common symptoms of depression include: Feeling depressed. Changes in sleep patterns. Changes in appetite or eating patterns. Feeling an overall lack of motivation or enjoyment of activities that you previously enjoyed. Frequent crying spells. Talk with your health care provider if you think that you are experiencing any of these symptoms. General instructions See your health care provider for regular wellness exams and vaccines. This may include: Scheduling regular health, dental, and eye exams. Getting and maintaining your vaccines. These include: Influenza vaccine. Get this vaccine each year before the flu season begins. Pneumonia vaccine. Shingles vaccine. Tetanus, diphtheria, and pertussis (Tdap) booster vaccine. Your health care provider may also recommend other immunizations. Tell your health care provider if you have ever been abused or do not feel safe at home. Summary Menopause is a normal process in which your ability to get pregnant comes to an end. This condition causes hot flashes, night sweats, decreased interest in sex, mood swings, headaches, or lack  of sleep. Treatment for this condition may include hormone replacement therapy. Take actions to keep yourself healthy, including exercising regularly, eating a healthy diet, watching your weight, and checking your blood pressure and blood sugar levels. Get screened for cancer and depression. Make sure that you are up to date with all your vaccines. This information is not intended to replace advice given to you by your health care provider. Make sure you discuss any questions you have with your health care provider. Document Revised: 02/22/2021 Document Reviewed: 02/22/2021 Elsevier Patient Education  2024 [employer].

## 2024-07-04 NOTE — Telephone Encounter (Signed)
 Copied from CRM [id number]. Topic: Clinical - Medication Question >> Jul 04, 2024  2:00 PM Mia F wrote: Reason for CRM: Pt was called by CMA Janate to ask if pt is still taking Lexapro . Pt states she still takes medication daily

## 2024-07-04 NOTE — Assessment & Plan Note (Signed)
 CBE performed. Pap smear UTD. She will schedule mammogram. RX for COVID vaccine.  Encouraged exercise program.

## 2024-07-04 NOTE — Telephone Encounter (Unsigned)
 Copied from CRM [id number]. Topic: Clinical - Medication Question >> Jul 04, 2024  2:00 PM Jill Atkinson wrote: Reason for CRM: Pt was called by CMA Janate to ask if pt is still taking Lexapro . Pt states she still takes medication daily

## 2024-07-04 NOTE — Telephone Encounter (Unsigned)
 Copied from CRM [id number]. Topic: Clinical - Medication Question >> Jul 04, 2024  2:00 PM Mia F wrote: Reason for CRM: Pt was called by CMA Janate to ask if pt is still taking Lexapro . Pt states she still takes medication daily

## 2024-07-04 NOTE — Telephone Encounter (Signed)
 NOTED

## 2024-07-05 LAB — IRON,TIBC AND FERRITIN PANEL
%SAT: 18 % (ref 16–45)
Ferritin: 117 ng/mL (ref 16–232)
Iron: 70 ug/dL (ref 45–160)
TIBC: 391 ug/dL (ref 250–450)

## 2024-07-05 LAB — MEASLES/MUMPS/RUBELLA IMMUNITY
Mumps IgG: 22.5 [AU]/ml
Rubella: 3.1 {index}
Rubeola IgG: >300 [AU]/ml

## 2024-07-11 ENCOUNTER — Ambulatory Visit: Payer: Self-pay | Admitting: Family

## 2024-08-29 ENCOUNTER — Telehealth: Admitting: Physician Assistant

## 2024-08-29 DIAGNOSIS — L237 Allergic contact dermatitis due to plants, except food: Secondary | ICD-10-CM | POA: Diagnosis not present

## 2024-08-29 MED ORDER — TRIAMCINOLONE ACETONIDE 0.1 % EX CREA: 1.0000 | TOPICAL_CREAM | Freq: Two times a day (BID) | CUTANEOUS | 0 refills | Status: DC

## 2024-08-29 MED ORDER — PREDNISONE 10 MG PO TABS: ORAL_TABLET | ORAL | 0 refills | Status: AC

## 2024-08-29 NOTE — Progress Notes (Signed)
 Virtual Visit Consent   Jill Atkinson, you are scheduled for a virtual visit with a [facility] provider today. Just as with appointments in the office, your consent must be obtained to participate. Your consent will be active for this visit and any virtual visit you may have with one of our providers in the next 365 days. If you have a MyChart account, a copy of this consent can be sent to you electronically.  As this is a virtual visit, video technology does not allow for your provider to perform a traditional examination. This may limit your provider's ability to fully assess your condition. If your provider identifies any concerns that need to be evaluated in person or the need to arrange testing (such as labs, EKG, etc.), we will make arrangements to do so. Although advances in technology are sophisticated, we cannot ensure that it will always work on either your end or our end. If the connection with a video visit is poor, the visit may have to be switched to a telephone visit. With either a video or telephone visit, we are not always able to ensure that we have a secure connection.  By engaging in this virtual visit, you consent to the provision of healthcare and authorize for your insurance to be billed (if applicable) for the services provided during this visit. Depending on your insurance coverage, you may receive a charge related to this service.  I need to obtain your verbal consent now. Are you willing to proceed with your visit today? Jill Atkinson has provided verbal consent on 08/29/2024 for a virtual visit (video or telephone). Jill Atkinson, [place]  Date: 08/29/2024 5:43 PM   Virtual Visit via Video Note   I, Jill Atkinson, connected with  Jill Atkinson  ([id number], 04-Mar-1966) on 08/29/24 at  6:15 PM EST by a video-enabled telemedicine application and verified that I am speaking with the correct person using two identifiers.  Location: Patient: Virtual Visit  Location Patient: Home Provider: Virtual Visit Location Provider: Home Office   I discussed the limitations of evaluation and management by telemedicine and the availability of in person appointments. The patient expressed understanding and agreed to proceed.    History of Present Illness: Jill Atkinson is a 58 y.o. who identifies as a female who was assigned female at birth, and is being seen today for poison ivy rash.  Patient endorses exposure Sunday.  On Monday noting some itching around her left ear with started a rash.  Since then has spread over her face bilaterally, as well as an eruption of rash on her right hand.  Denies shortness of breath.  Has been applying over-the-counter Caladryl and taking Benadryl at nighttime.     HPI: HPI  Problems:  Patient Active Problem List   Diagnosis Date Noted   Erythrocytosis 09/12/2023   Arthritis 07/04/2023   Left ankle pain 12/09/2020   Arrhythmia    Prediabetes 11/14/2018   Class 1 obesity with serious comorbidity and body mass index (BMI) of 30.0 to 30.9 in adult 11/01/2018   GERD (gastroesophageal reflux disease) 10/29/2018   Cellulitis of right lower extremity 04/05/2017   Sinus congestion 10/26/2016   Cough 08/08/2016   Depression 05/30/2016   Hypertension 08/11/2015   Screening for breast cancer 08/11/2015   Special screening for malignant neoplasms, colon 08/11/2015   Menopausal disorder 09/27/2013   Routine general medical examination at a health care facility 08/06/2013   Exercise-induced asthma 08/02/2012   History  of prior ablation treatment 08/02/2012    Allergies:  Allergies  Allergen Reactions   Bactrim  [Sulfamethoxazole -Trimethoprim ]     Lip swelling   Medications:  Current Outpatient Medications:    predniSONE  (DELTASONE ) 10 MG tablet, Take 4 tablets (40 mg total) by mouth daily with breakfast for 4 days, THEN 3 tablets (30 mg total) daily with breakfast for 4 days, THEN 2 tablets (20 mg total) daily with  breakfast for 3 days, THEN 1 tablet (10 mg total) daily with breakfast for 3 days., Disp: 37 tablet, Rfl: 0   triamcinolone  cream (KENALOG ) 0.1 %, Apply 1 Application topically 2 (two) times daily., Disp: 30 g, Rfl: 0   albuterol  (VENTOLIN  HFA) 108 (90 Base) MCG/ACT inhaler, Inhale 2 puffs into the lungs every 6 (six) hours as needed for wheezing., Disp: 18 g, Rfl: 3   buPROPion  (WELLBUTRIN  XL) 300 MG 24 hr tablet, Take 1 tablet (300 mg total) by mouth daily., Disp: 90 tablet, Rfl: 3   Cholecalciferol (D3-1000) 25 MCG (1000 UT) tablet, , Disp: , Rfl:    escitalopram  (LEXAPRO ) 10 MG tablet, Take 1 tablet (10 mg total) by mouth daily., Disp: 90 tablet, Rfl: 3   famotidine (PEPCID) 10 MG tablet, Take 10 mg by mouth 2 (two) times daily., Disp: , Rfl:    Ferrous Sulfate (IRON) 28 MG TABS, , Disp: , Rfl:    fluticasone  (FLONASE ) 50 MCG/ACT nasal spray, Place 2 sprays into both nostrils daily for 10 days., Disp: 16 g, Rfl: 0   Levocetirizine Dihydrochloride (XYZAL ALLERGY 24HR PO), Take by mouth daily., Disp: , Rfl:    lisinopril  (ZESTRIL ) 20 MG tablet, Take 1 tablet (20 mg total) by mouth daily., Disp: 90 tablet, Rfl: 3  Observations/Objective: Patient is well-developed, well-nourished in no acute distress.  Resting comfortably at home.  Head is normocephalic, atraumatic.  No labored breathing. Speech is clear and coherent with logical content.  Patient is alert and oriented at baseline.  Erythematous papulovesicular rash noted on face bilaterally.  Similar rash noted of dorsum of right hand.  Assessment and Plan: 1. Poison ivy dermatitis (Primary) - triamcinolone  cream (KENALOG ) 0.1 %; Apply 1 Application topically 2 (two) times daily.  Dispense: 30 g; Refill: 0 - predniSONE  (DELTASONE ) 10 MG tablet; Take 4 tablets (40 mg total) by mouth daily with breakfast for 4 days, THEN 3 tablets (30 mg total) daily with breakfast for 4 days, THEN 2 tablets (20 mg total) daily with breakfast for 3 days, THEN  1 tablet (10 mg total) daily with breakfast for 3 days.  Dispense: 37 tablet; Refill: 0  Supportive measures and OTC medications reviewed.  Giving progression of symptoms and facial involvement, will start prednisone  taper.  Will also Rx Kenalog  cream to use on the hands.  Follow-up in person for any nonresolving, new or worsening symptoms despite treatment.  Follow Up Instructions: I discussed the assessment and treatment plan with the patient. The patient was provided an opportunity to ask questions and all were answered. The patient agreed with the plan and demonstrated an understanding of the instructions.  A copy of instructions were sent to the patient via MyChart unless otherwise noted below.   The patient was advised to call back or seek an in-person evaluation if the symptoms worsen or if the condition fails to improve as anticipated.    Jill Velma Lunger, PA-C

## 2024-08-29 NOTE — Patient Instructions (Signed)
 [name], thank you for joining [name], PA-C for today's virtual visit.  While this provider is not your primary care provider (PCP), if your PCP is located in our provider database this encounter information will be shared with them immediately following your visit.   A [facility] MyChart account gives you access to today's visit and all your visits, tests, and labs performed at [facility]  click here if you don't have a [facility] MyChart account or go to [email or web address]  Consent: (Patient) Jill Atkinson provided verbal consent for this virtual visit at the beginning of the encounter.  Current Medications:  Current Outpatient Medications:    albuterol  (VENTOLIN  HFA) 108 (90 Base) MCG/ACT inhaler, Inhale 2 puffs into the lungs every 6 (six) hours as needed for wheezing., Disp: 18 g, Rfl: 3   buPROPion  (WELLBUTRIN  XL) 300 MG 24 hr tablet, Take 1 tablet (300 mg total) by mouth daily., Disp: 90 tablet, Rfl: 3   Cholecalciferol (D3-1000) 25 MCG (1000 UT) tablet, , Disp: , Rfl:    escitalopram  (LEXAPRO ) 10 MG tablet, Take 1 tablet (10 mg total) by mouth daily., Disp: 90 tablet, Rfl: 3   famotidine (PEPCID) 10 MG tablet, Take 10 mg by mouth 2 (two) times daily., Disp: , Rfl:    Ferrous Sulfate (IRON) 28 MG TABS, , Disp: , Rfl:    fluticasone  (FLONASE ) 50 MCG/ACT nasal spray, Place 2 sprays into both nostrils daily for 10 days., Disp: 16 g, Rfl: 0   Levocetirizine Dihydrochloride (XYZAL ALLERGY 24HR PO), Take by mouth daily., Disp: , Rfl:    lisinopril  (ZESTRIL ) 20 MG tablet, Take 1 tablet (20 mg total) by mouth daily., Disp: 90 tablet, Rfl: 3   Medications ordered in this encounter:  No orders of the defined types were placed in this encounter.    *If you need refills on other medications prior to your next appointment, please contact your pharmacy*  Follow-Up: Call back or seek an in-person evaluation if the symptoms worsen or if the  condition fails to improve as anticipated.  [facility] [phone]  Other Instructions Poison Ivy Dermatitis Poison ivy dermatitis is redness and soreness of the skin caused by chemicals in the leaves of the poison ivy plant. You may have very bad itching, swelling, a rash, and blisters. What are the causes? Touching a poison ivy plant. Touching something that has the chemical on it. This may include animals or objects that have come in contact with the plant. What increases the risk? Going outdoors often in wooded or [place] areas. Going outdoors without wearing protective clothing, such as closed shoes, long pants, and a long-sleeved shirt. What are the signs or symptoms?  Skin redness. Very bad itching. A rash that often includes bumps and blisters. The rash usually appears 48 hours after exposure, if you have had it before. If this is the first time you have it, the rash may not appear until a week after exposure. Swelling. This may occur if the reaction is very bad. Symptoms usually last for 1-2 weeks. The first time you get this condition, symptoms may last 3-4 weeks. How is this treated? This condition may be treated with: Hydrocortisone cream or calamine lotion to relieve itching. Oatmeal baths to soothe the skin. Medicines, such as over-the-counter antihistamine tablets. Oral steroid medicine for very bad reactions. Follow these instructions at home: Medicines Take or apply over-the-counter and prescription medicines only as told by your doctor. Use hydrocortisone cream  or calamine lotion as needed to help with itching. General instructions Do not scratch or rub your skin. Put a cold, wet cloth (cold compress) on the affected areas or take baths in cool water. This will help with itching. Avoid hot baths and showers. Take oatmeal baths as needed. Use colloidal oatmeal. You can get this at a pharmacy or grocery store. Follow the instructions on the  package. While you have the rash, wash your clothes right after you wear them. Check the affected area every day for signs of infection. Check for: More redness, swelling, or pain. Fluid or blood. Warmth. Pus or a bad smell. Keep all follow-up visits. Your doctor may want to see how your skin is doing with treatment. How is this prevented?  Know what poison ivy looks like, so you can avoid it. This plant has three leaves with flowering branches on a single stem. The leaves are glossy. The leaves have uneven edges that come to a point. If you touch poison ivy, wash your skin with soap and water right away. Be sure to wash under your fingernails. When hiking or camping, wear long pants, a long-sleeved shirt, long socks, and hiking boots. You can also use a lotion on your skin that helps to prevent contact with poison ivy. If you think that your clothes or outdoor gear came in contact with poison ivy, rinse them off with a garden hose before you bring them inside your house. When doing yard work or gardening, wear gloves, long sleeves, long pants, and boots. Wash your garden tools and gloves if they come in contact with poison ivy. If you think that your pet has come into contact with poison ivy, wash them with pet shampoo and water. Make sure to wear gloves while washing your pet. Contact a doctor if: You have open sores in the rash area. You have any signs of infection. You have redness that spreads past the rash area. You have a fever. You have a rash over a large area of your body. You have a rash on your eyes, mouth, or genitals. Your rash does not get better after a few weeks. Get help right away if: Your face swells or your eyes swell shut. You have trouble breathing. You have trouble swallowing. These symptoms may be an emergency. Do not wait to see if the symptoms will go away. Get help right away. Call 911. This information is not intended to replace advice given to you by your  health care provider. Make sure you discuss any questions you have with your health care provider. Document Revised: 03/03/2022 Document Reviewed: 03/03/2022 Elsevier Patient Education  2024 [employer]   If you have been instructed to have an in-person evaluation today at a local Urgent Care facility, please use the link below. It will take you to a list of all of our available [facility], including address, phone number and hours of operation. Please do not delay care.  [facility]  If you or a family member do not have a primary care provider, use the link below to schedule a visit and establish care. When you choose a [facility] primary care physician or advanced practice provider, you gain a long-term partner in health. Find a Primary Care Provider  Learn more about [facility]'s in-office and virtual care options: [facility] - Get Care Now

## 2024-10-15 ENCOUNTER — Encounter

## 2024-10-16 ENCOUNTER — Ambulatory Visit
Admission: EM | Admit: 2024-10-16 | Discharge: 2024-10-16 | Disposition: A | Discharge status: Home / Self Care | Source: Home / Self Care

## 2024-10-16 ENCOUNTER — Inpatient Hospital Stay: Admission: RE | Admit: 2024-10-16 | Source: Ambulatory Visit

## 2024-10-16 DIAGNOSIS — J069 Acute upper respiratory infection, unspecified: Secondary | ICD-10-CM

## 2024-10-16 MED ORDER — PROMETHAZINE-DM 6.25-15 MG/5ML PO SYRP: 5.0000 mL | ORAL_SOLUTION | Freq: Four times a day (QID) | ORAL | 0 refills | Status: AC | PRN

## 2024-10-16 MED ORDER — AMOXICILLIN-POT CLAVULANATE 875-125 MG PO TABS: 1.0000 | ORAL_TABLET | Freq: Two times a day (BID) | ORAL | 0 refills | Status: AC

## 2024-10-16 MED ORDER — IPRATROPIUM BROMIDE 0.06 % NA SOLN: 2.0000 | Freq: Four times a day (QID) | NASAL | 12 refills | Status: AC

## 2024-10-16 MED ORDER — BENZONATATE 100 MG PO CAPS: 200.0000 mg | ORAL_CAPSULE | Freq: Three times a day (TID) | ORAL | 0 refills | Status: AC

## 2024-10-16 NOTE — Discharge Instructions (Addendum)
 The Augmentin twice daily with food for 7 days for treatment of your URI.  Perform sinus irrigation 2-3 times a day with a [employer] sinus rinse kit and distilled water.  Do not use tap water.  You can use plain over-the-counter Mucinex every 6 hours to break up the stickiness of the mucus so your body can clear it.  Increase your oral fluid intake to thin out your mucus so that is also able for your body to clear more easily.  Take an over-the-counter probiotic, such as Culturelle-align-activia, 1 hour after each dose of antibiotic to prevent diarrhea.  Use the Atrovent nasal spray, 2 squirts in each nostril every 6 hours, as needed for runny nose and postnasal drip.  Use the Tessalon Perles every 8 hours during the day.  Take them with a small sip of water.  They may give you some numbness to the base of your tongue or a metallic taste in your mouth, this is normal.  Use the Promethazine DM cough syrup at bedtime for cough and congestion.  It will make you drowsy so do not take it during the day.  Return for reevaluation or see your primary care provider for any new or worsening symptoms.

## 2024-10-16 NOTE — ED Triage Notes (Signed)
 Pt c/o cough & head congestion x2 wks. Has tried OTC meds w/o relief.

## 2024-10-16 NOTE — ED Provider Notes (Signed)
 " [facility]    CSN: [id number] Arrival date & time: 10/16/24  0900      History   Chief Complaint Chief Complaint  Patient presents with   Cough    HPI Jill Atkinson is a 58 y.o. female.   HPI  58 year old female with past medical history significant for exercise-induced asthma, hypertension, GERD, and prediabetes presents for evaluation of 2 weeks worth of nasal congestion with thick yellow-green nasal discharge that is on occasion bloody, and a productive cough or yellow-green sputum.  No shortness of breath or wheezing.  Patient reports that she has been running intermittent fevers with a Tmax of 101.52 days ago.  Past Medical History:  Diagnosis Date   Allergy    hay fever   Anxiety    Arrhythmia    AVNRT, s/p ablation Dr. Pierrette at [facility]   Asthma    exercise induced   Chicken pox    Depression    post partum   Food allergy    GERD (gastroesophageal reflux disease)    Hypertension    IBS (irritable bowel syndrome)    Palpitations    Reflux esophagitis    Shingles    Shortness of breath    Swelling of both lower extremities    UTI (lower urinary tract infection)     Patient Active Problem List   Diagnosis Date Noted   Erythrocytosis 09/12/2023   Arthritis 07/04/2023   Left ankle pain 12/09/2020   Arrhythmia    Prediabetes 11/14/2018   Class 1 obesity with serious comorbidity and body mass index (BMI) of 30.0 to 30.9 in adult 11/01/2018   GERD (gastroesophageal reflux disease) 10/29/2018   Cellulitis of right lower extremity 04/05/2017   Sinus congestion 10/26/2016   Cough 08/08/2016   Depression 05/30/2016   Hypertension 08/11/2015   Screening for breast cancer 08/11/2015   Special screening for malignant neoplasms, colon 08/11/2015   Menopausal disorder 09/27/2013   Routine general medical examination at a health care facility 08/06/2013   Exercise-induced asthma 08/02/2012   History of prior ablation treatment 08/02/2012     Past Surgical History:  Procedure Laterality Date   CARDIAC ELECTROPHYSIOLOGY STUDY AND ABLATION  2012    COLONOSCOPY WITH PROPOFOL  N/A 11/02/2015   Procedure: COLONOSCOPY WITH PROPOFOL ;  Surgeon: Lamar ONEIDA Holmes, MD;  Location: [facility] ENDOSCOPY;  Service: Endoscopy;  Laterality: N/A;   DILATION AND CURETTAGE OF UTERUS  1995   post delivery   NASAL SINUS SURGERY  2006   TONSILLECTOMY  1978    OB History     Gravida  2   Para  2   Term      Preterm      AB      Living         SAB      IAB      Ectopic      Multiple      Live Births               Home Medications    Prior to Admission medications  Medication Sig Start Date End Date Taking? Authorizing Provider  amoxicillin -clavulanate (AUGMENTIN ) 875-125 MG tablet Take 1 tablet by mouth every 12 (twelve) hours for 7 days. 10/16/24 10/23/24 Yes Bernardino Ditch, NP  benzonatate (TESSALON) 100 MG capsule Take 2 capsules (200 mg total) by mouth every 8 (eight) hours. 10/16/24  Yes Bernardino Ditch, NP  ipratropium (ATROVENT) 0.06 % nasal spray Place 2 sprays into both nostrils  4 (four) times daily. 10/16/24  Yes Bernardino Ditch, NP  promethazine -dextromethorphan (PROMETHAZINE -DM) 6.25-15 MG/5ML syrup Take 5 mLs by mouth 4 (four) times daily as needed. 10/16/24  Yes Bernardino Ditch, NP  albuterol  (VENTOLIN  HFA) 108 (90 Base) MCG/ACT inhaler Inhale 2 puffs into the lungs every 6 (six) hours as needed for wheezing. 03/17/23   Brimage, Vondra, DO  buPROPion  (WELLBUTRIN  XL) 300 MG 24 hr tablet Take 1 tablet (300 mg total) by mouth daily. 07/04/24   Dineen Rollene MATSU, FNP  Cholecalciferol (D3-1000) 25 MCG (1000 UT) tablet  10/22/18   [provider]  escitalopram  (LEXAPRO ) 10 MG tablet Take 1 tablet (10 mg total) by mouth daily. 07/04/24   Dineen Rollene MATSU, FNP  famotidine (PEPCID) 10 MG tablet Take 10 mg by mouth 2 (two) times daily.    [provider]  Ferrous Sulfate (IRON) 28 MG TABS  11/15/20   [provider]  Levocetirizine Dihydrochloride (XYZAL ALLERGY 24HR PO) Take by mouth daily.    [provider]  lisinopril  (ZESTRIL ) 20 MG tablet Take 1 tablet (20 mg total) by mouth daily. 07/04/24   Dineen Rollene MATSU, FNP    Family History Family History  Problem Relation Age of Onset   Arthritis Mother    Hypertension Mother    Thyroid  disease Mother    Arthritis Father    Alcohol abuse Maternal Grandmother    Drug abuse Maternal Grandmother    Arthritis Maternal Grandmother    Stroke Maternal Grandmother    Hypertension Maternal Grandmother    Alcohol abuse Maternal Grandfather    Drug abuse Maternal Grandfather    Arthritis Maternal Grandfather    Hypertension Maternal Grandfather    Breast cancer Neg Hx     Social History Social History[1]   Allergies   Bactrim  [sulfamethoxazole -trimethoprim ]   Review of Systems Review of Systems  Constitutional:  Positive for fever.  HENT:  Positive for congestion, rhinorrhea and sinus pressure. Negative for ear pain and sore throat.   Respiratory:  Positive for cough. Negative for shortness of breath and wheezing.      Physical Exam Triage Vital Signs ED Triage Vitals [10/16/24 1002]  Encounter Vitals Group     BP      Girls Systolic BP Percentile      Girls Diastolic BP Percentile      Boys Systolic BP Percentile      Boys Diastolic BP Percentile      Pulse      Resp      Temp      Temp src      SpO2      Weight 179 lb 12.8 oz (81.6 kg)     Height      Head Circumference      Peak Flow      Pain Score      Pain Loc      Pain Education      Exclude from Growth Chart    No data found.  Updated Vital Signs BP (!) 141/85 (BP Location: Left Arm)   Pulse 95   Temp 98.3 F (36.8 C) (Oral)   Resp 18   Wt 179 lb 12.8 oz (81.6 kg)   LMP 05/26/2016   SpO2 100%   BMI 29.02 kg/m   Visual Acuity Right Eye Distance:   Left Eye Distance:   Bilateral Distance:    Right Eye Near:   Left Eye Near:     Bilateral Near:  Physical Exam Vitals and nursing note reviewed.  Constitutional:      Appearance: Normal appearance. She is ill-appearing.  HENT:     Head: Normocephalic and atraumatic.     Right Ear: Tympanic membrane, ear canal and external ear normal. There is no impacted cerumen.     Left Ear: Tympanic membrane, ear canal and external ear normal. There is no impacted cerumen.     Nose: Congestion and rhinorrhea present.     Comments: His mucosa is erythematous and edematous with thick yellow discharge in both nares.  Bilateral frontal maxillary sinuses are nontender to compression.    Mouth/Throat:     Mouth: Mucous membranes are moist.     Pharynx: Oropharynx is clear. No oropharyngeal exudate or posterior oropharyngeal erythema.  Cardiovascular:     Rate and Rhythm: Normal rate and regular rhythm.     Pulses: Normal pulses.     Heart sounds: Normal heart sounds. No murmur heard.    No friction rub. No gallop.  Pulmonary:     Effort: Pulmonary effort is normal.     Breath sounds: Normal breath sounds. No wheezing, rhonchi or rales.  Musculoskeletal:     Cervical back: Normal range of motion and neck supple. No tenderness.  Lymphadenopathy:     Cervical: No cervical adenopathy.  Skin:    General: Skin is warm and dry.     Capillary Refill: Capillary refill takes less than 2 seconds.     Findings: No erythema or rash.  Neurological:     General: No focal deficit present.     Mental Status: She is alert and oriented to person, place, and time.      UC Treatments / Results  Labs (all labs ordered are listed, but only abnormal results are displayed) Labs Reviewed - No data to display  EKG   Radiology No results found.  Procedures Procedures (including critical care time)  Medications Ordered in UC Medications - No data to display  Initial Impression / Assessment and Plan / UC Course  I have reviewed the triage vital signs and the nursing  notes.  Pertinent labs & imaging results that were available during my care of the patient were reviewed by me and considered in my medical decision making (see chart for details).   Patient is a pleasant, though ill-appearing, 58 year old female presenting for evaluation of 2 weeks with the respiratory symptoms as outlined in HPI above.  She is endorsing significant pressure in her head though her sinuses are nontender to compression.  She does have inflamed nasal mucosa with thick yellow discharge in both nares.  She is also complaining of producing the same yellow-green sputum when she coughs but her lungs are clear to auscultation all fields.  She is able to speak in full sentence without dyspnea or tachypnea.  I suspect that her sputum production is secondary to postnasal drip.  I will treat her for a URI with cough and congestion with a 7-day course of Augmentin  875 mL with Atrovent nasal spray for her nasal congestion and Tessalon Perles and Promethazine  DM cough syrup for cough and congestion.  We discussed doing sinus irrigation to help alleviate her mucus burden.  Return precautions reviewed.  She denies need for work note.   Final Clinical Impressions(s) / UC Diagnoses   Final diagnoses:  URI with cough and congestion     Discharge Instructions      The Augmentin  twice daily with food for 7 days for treatment of  your URI.  Perform sinus irrigation 2-3 times a day with a [employer] sinus rinse kit and distilled water.  Do not use tap water.  You can use plain over-the-counter Mucinex every 6 hours to break up the stickiness of the mucus so your body can clear it.  Increase your oral fluid intake to thin out your mucus so that is also able for your body to clear more easily.  Take an over-the-counter probiotic, such as Culturelle-align-activia, 1 hour after each dose of antibiotic to prevent diarrhea.  \Use the Atrovent nasal spray, 2 squirts in each nostril every 6 hours, as needed  for runny nose and postnasal drip.  Use the Tessalon Perles every 8 hours during the day.  Take them with a small sip of water.  They may give you some numbness to the base of your tongue or a metallic taste in your mouth, this is normal.  Use the Promethazine  DM cough syrup at bedtime for cough and congestion.  It will make you drowsy so do not take it during the day.  Return for reevaluation or see your primary care provider for any new or worsening symptoms.       ED Prescriptions     Medication Sig Dispense Auth. Provider   amoxicillin -clavulanate (AUGMENTIN ) 875-125 MG tablet Take 1 tablet by mouth every 12 (twelve) hours for 7 days. 14 tablet Bernardino Ditch, NP   benzonatate (TESSALON) 100 MG capsule Take 2 capsules (200 mg total) by mouth every 8 (eight) hours. 21 capsule Bernardino Ditch, NP   ipratropium (ATROVENT) 0.06 % nasal spray Place 2 sprays into both nostrils 4 (four) times daily. 15 mL Bernardino Ditch, NP   promethazine -dextromethorphan (PROMETHAZINE -DM) 6.25-15 MG/5ML syrup Take 5 mLs by mouth 4 (four) times daily as needed. 118 mL Bernardino Ditch, NP      PDMP not reviewed this encounter.    [1]  Social History Tobacco Use   Smoking status: Never   Smokeless tobacco: Never  Vaping Use   Vaping status: Never Used  Substance Use Topics   Alcohol use: Yes    Alcohol/week: 2.0 standard drinks of alcohol    Types: 2 Glasses of wine per week    Comment: GLASS OF WINE NIGHTLY   Drug use: No     Bernardino Ditch, NP 10/16/24 1019  "
